# Patient Record
Sex: Female | Born: 1965 | Race: White | Hispanic: No | Marital: Married | Smoking: Never smoker
Health system: Southern US, Community
[De-identification: ages and names within clinical notes are randomized; demographics above are authoritative.]

## PROBLEM LIST (undated history)

## (undated) DIAGNOSIS — E079 Disorder of thyroid, unspecified: Secondary | ICD-10-CM

## (undated) DIAGNOSIS — E785 Hyperlipidemia, unspecified: Secondary | ICD-10-CM

## (undated) DIAGNOSIS — G43909 Migraine, unspecified, not intractable, without status migrainosus: Secondary | ICD-10-CM

## (undated) DIAGNOSIS — I82409 Acute embolism and thrombosis of unspecified deep veins of unspecified lower extremity: Secondary | ICD-10-CM

## (undated) DIAGNOSIS — Z803 Family history of malignant neoplasm of breast: Secondary | ICD-10-CM

## (undated) DIAGNOSIS — F32A Depression, unspecified: Secondary | ICD-10-CM

## (undated) DIAGNOSIS — D689 Coagulation defect, unspecified: Secondary | ICD-10-CM

## (undated) DIAGNOSIS — N879 Dysplasia of cervix uteri, unspecified: Secondary | ICD-10-CM

## (undated) DIAGNOSIS — A64 Unspecified sexually transmitted disease: Secondary | ICD-10-CM

## (undated) DIAGNOSIS — R32 Unspecified urinary incontinence: Secondary | ICD-10-CM

## (undated) DIAGNOSIS — F329 Major depressive disorder, single episode, unspecified: Secondary | ICD-10-CM

## (undated) HISTORY — DX: Unspecified sexually transmitted disease: A64

## (undated) HISTORY — DX: Major depressive disorder, single episode, unspecified: F32.9

## (undated) HISTORY — DX: Family history of malignant neoplasm of breast: Z80.3

## (undated) HISTORY — DX: Migraine, unspecified, not intractable, without status migrainosus: G43.909

## (undated) HISTORY — DX: Acute embolism and thrombosis of unspecified deep veins of unspecified lower extremity: I82.409

## (undated) HISTORY — DX: Coagulation defect, unspecified: D68.9

## (undated) HISTORY — DX: Unspecified urinary incontinence: R32

## (undated) HISTORY — DX: Disorder of thyroid, unspecified: E07.9

## (undated) HISTORY — DX: Depression, unspecified: F32.A

## (undated) HISTORY — DX: Hyperlipidemia, unspecified: E78.5

## (undated) HISTORY — DX: Dysplasia of cervix uteri, unspecified: N87.9

## (undated) HISTORY — PX: DILATION AND CURETTAGE OF UTERUS: SHX78

## (undated) HISTORY — PX: CERVICAL BIOPSY  W/ LOOP ELECTRODE EXCISION: SUR135

## (undated) HISTORY — PX: IUD REMOVAL: SHX5392

---

## 1999-09-14 HISTORY — PX: BREAST BIOPSY: SHX20

## 2007-10-23 ENCOUNTER — Ambulatory Visit (HOSPITAL_COMMUNITY): Admission: RE | Admit: 2007-10-23 | Discharge: 2007-10-23 | Payer: Self-pay | Admitting: Obstetrics

## 2007-12-18 DIAGNOSIS — A6000 Herpesviral infection of urogenital system, unspecified: Secondary | ICD-10-CM | POA: Insufficient documentation

## 2010-04-08 ENCOUNTER — Encounter: Payer: Self-pay | Admitting: Internal Medicine

## 2010-05-21 ENCOUNTER — Ambulatory Visit: Payer: Self-pay | Admitting: Internal Medicine

## 2010-05-21 DIAGNOSIS — R0602 Shortness of breath: Secondary | ICD-10-CM | POA: Insufficient documentation

## 2010-05-21 DIAGNOSIS — G43909 Migraine, unspecified, not intractable, without status migrainosus: Secondary | ICD-10-CM | POA: Insufficient documentation

## 2010-05-21 DIAGNOSIS — Z8541 Personal history of malignant neoplasm of cervix uteri: Secondary | ICD-10-CM | POA: Insufficient documentation

## 2010-05-21 DIAGNOSIS — J45909 Unspecified asthma, uncomplicated: Secondary | ICD-10-CM | POA: Insufficient documentation

## 2010-07-14 ENCOUNTER — Telehealth (INDEPENDENT_AMBULATORY_CARE_PROVIDER_SITE_OTHER): Payer: Self-pay | Admitting: *Deleted

## 2010-10-13 NOTE — Assessment & Plan Note (Signed)
Summary: Pulmnary consultation/ ? asthma > start dulera 100   Visit Type:  Initial Consult Copy to:  self Primary Agam Tuohy/Referring Mercy Leppla:  Dr. Herb Grays  CC:  Wheezing and Dyspnea.  History of Present Illness: 22 yowf never smoker no resp problems prior to dx of pna in January 2011 and not right since  May 21, 2010  1st pulmonary office eval for doe with ex slt uphill to bustop and winded sometimes with a cough,  occ wake up coughing and also wheezing better transiently with saba.  Pt denies any significant sore throat, dysphagia, itching, sneezing,  nasal congestion or excess secretions,  fever, chills, sweats, unintended wt loss, pleuritic or exertional cp, hempoptysis, variability  in activity tolerance  orthopnea pnd or leg swelling Pt also denies any obvious fluctuation in symptoms with weather or environmental change or other alleviating or aggravating factors.       Current Medications (verified): 1)  Fluoxetine Hcl 20 Mg Caps (Fluoxetine Hcl) .Marland Kitchen.. 1 Two Times A Day 2)  Synthroid 150 Mcg Tabs (Levothyroxine Sodium) .Marland Kitchen.. 1 Once Daily 3)  Topamax 50 Mg Tabs (Topiramate) .... 2 Once Daily 4)  Wellbutrin Xl 150 Mg Xr24h-Tab (Bupropion Hcl) .... 3 Once Daily 5)  Junel Fe 1/20 1-20 Mg-Mcg Tabs (Norethin Ace-Eth Estrad-Fe) .Marland Kitchen.. 1 Once Daily 6)  Proventil Hfa 108 (90 Base) Mcg/act Aers (Albuterol Sulfate) .... 2 Puffs Every 4-6 Hrs As Needed 7)  Fioricet 50-325-40 Mg Tabs (Butalbital-Apap-Caffeine) .Marland Kitchen.. 1 To 2 Every 6 Hrs As Needed  Allergies (verified): No Known Drug Allergies  Past History:  Past Medical History: Asthma, onset p CAP Jan 2011    - HF  75% May 21, 2010   Family History: Breast CA- Mother Heart dz- Father sister ? 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  The patient denies productive cough, coughing up blood, irregular heartbeats, acid heartburn, indigestion, loss of appetite, abdominal pain, difficulty swallowing, sore throat, tooth/dental problems, nasal congestion/difficulty breathing through nose, sneezing, anxiety, depression, hand/feet swelling, joint stiffness or pain, rash, change in color of mucus, and fever.    Vital Signs:  Patient profile:   45 year old female Height:      67 inches Weight:      182 pounds BMI:     28.61 O2 Sat:      97 % on Room air Temp:     98.7 degrees F oral Pulse rate:   73 / minute BP sitting:   116 / 70  (right arm)  Vitals Entered By: Vernie Murders (May 21, 2010 10:26 AM)  O2 Flow:  Room air  Physical Exam  Additional Exam:  amb pleasant wf nad wt 182 May 21, 2010 HEENT: nl dentition, turbinates, and orophanx. Nl external ear canals without cough reflex NECK :  without JVD/Nodes/TM/ nl carotid upstrokes bilaterally LUNGS: no acc muscle use,  a few basilar insp pops and squeaks  bilaterally without cough on insp or exp maneuvers CV:  RRR  no s3 or murmur or increase in P2, no edema  ABD:  soft and nontender with nl excursion in the supine position. No bruits or organomegaly, bowel sounds nl MS:  warm without deformities, calf tenderness, cyanosis or clubbing SKIN:  warm and dry without lesions   NEURO:  alert, approp, no deficits     CXR  Procedure date:  04/08/2010  Findings:      No acute cardiopulmonary findings  Impression & Recommendations:  Problem # 1:  ASTHMA, UNSPECIFIED (ICD-493.90) New onset p apparent cap ? mycoplasma ? no way to be sure - response to albuterol is convincing she needs a trial of combination rx with laba/ics to see first whether symptoms can be irradicated and return for baseline pft's for correlation    DDX of  difficult airways managment all start with A and  include Adherence,  Ace Inhibitors, Acid Reflux, Active Sinus Disease, Alpha 1 Antitripsin deficiency, Anxiety masquerading as Airways dz,  ABPA,  allergy(esp in young), Aspiration (esp in elderly), Adverse effects of DPI,  Active smokers, plus one B  = Beta blocker use..     Adherence is the biggest issue in most asthmatics  and starts with  inability to use HFA optimally  and also  understand that SABA treats the symptoms but doesn't get to the underlying problem (inflammation).  I used  the ananology of putting steroid cream on a rash to help explain the meaning of topical therapy and the need to get the drug to the target tissue.  I spent extra time with the patient today explaining optimal mdi  technique.  This improved from  50-75%  ? acid reflux: rx empirically until cough erradicated since cough from asthma  can cause reflux and destabilize the airway  ? Allergy/ sinus dz:  address next if not improving on rx  Medications Added to Medication List This Visit: 1)  Fluoxetine Hcl 20 Mg Caps (Fluoxetine hcl) .Marland Kitchen.. 1 two times a day 2)  Synthroid 150 Mcg Tabs (Levothyroxine sodium) .Marland Kitchen.. 1 once daily 3)  Topamax 50 Mg Tabs (Topiramate) .... 2 once daily 4)  Wellbutrin Xl 150 Mg Xr24h-tab (Bupropion hcl) .... 3 once daily 5)  Junel Fe 1/20 1-20 Mg-mcg Tabs (Norethin ace-eth estrad-fe) .Marland Kitchen.. 1 once daily 6)  Proventil Hfa 108 (90 Base) Mcg/act Aers (Albuterol sulfate) .... 2 puffs every 4-6 hrs as needed 7)  Fioricet 50-325-40 Mg Tabs (Butalbital-apap-caffeine) .Marland Kitchen.. 1 to 2 every 6 hrs as needed 8)  Dulera 100-5 Mcg/act Aero (Mometasone furo-formoterol fum) .... 2 puffs first thing  in am and 2 puffs again in pm about 12 hours later 9)  Protonix 40 Mg Solr (Pantoprazole sodium) .... Take  one 30-60 min before first meal of the day 10)  Pepcid Ac Maximum Strength 20 Mg Tabs (Famotidine) .... One at bedtime  Other Orders: Consultation Level V [phone])  Patient Instructions: 1)  Dulera 100 2 puffs first thing  in am  and 2 puffs again in pm about 12 hours later you should much less need for the proventil 2)  Work on inhaler technique:  relax and blow all the way out then take a nice smooth deep breath back in, triggering the inhaler at same time you start breathing in and hold for a few sec and then rinse mouth 3)  Pantoprozole 40 Take  one 30-60 min before first meal of the day and pepcid 20 mg at bedtime until cough is 100% gone 4)  GERD (REFLUX)  is a common cause of respiratory symptoms. It commonly presents without heartburn and can be treated with medication, but also with lifestyle changes including avoidance of late meals, excessive alcohol, smoking cessation, and avoid fatty foods, chocolate, peppermint, colas, red wine, and  acidic juices such as orange juice. NO MINT OR MENTHOL PRODUCTS SO NO COUGH DROPS  5)  USE SUGARLESS CANDY INSTEAD (jolley ranchers)  6)  NO OIL BASED VITAMINS  7)  Please schedule a follow-up appointment in 4  weeks, sooner if needed  Prescriptions: PROTONIX 40 MG SOLR (PANTOPRAZOLE SODIUM) Take  one 30-60 min before first meal of the day  #34 x 2   Entered and Authorized by:   Nyoka Cowden MD   Signed by:   Nyoka Cowden MD on 05/21/2010   Method used:   [facility]  [place] [address]       [address]       [place], [place]  [address]       Ph: [phone] or [phone]       Fax: [phone]   RxID:   [id number]

## 2010-10-13 NOTE — Progress Notes (Signed)
Summary: Not satisfied with dulera 100, try symbicot 160-LMTCBx1  Phone Note Call from Patient   Caller: Patient Call For: WERT Summary of Call: pt says the dulera worked well. but now that she has finished using this she is wheezing. [facility]s advise[facility] [place] pt cell [phone] Initial call taken by: Tivis Ringer, CNA,  July 14, 2010 10:26 AM  Follow-up for Phone Call        Called, spoke with pt.  She states dulera "seems to work sometimes but not all the time."  States chest is not as heavy, SOB has improved, but still feels " the wheeze every now and then."  WOuld like to know if there is something she can try in place of the dulera.  Will forward message to MW to address. Follow-up by: Gweneth Dimitri RN,  July 14, 2010 10:57 AM  Additional Follow-up for Phone Call Additional follow up Details #1::        planned to see her back before she ran out of dulera so try sample of symbicort 160 x one box only rx 2 puffs first thing  in am and 2 puffs again in pm about 12 hours later and bring this and all active meds to ov w/in 2 weeks Additional Follow-up by: Nyoka Cowden MD,  July 14, 2010 1:19 PM    Additional Follow-up for Phone Call Additional follow up Details #2::    LMTCBx1.Carron Curie CMA  July 14, 2010 2:39 PM  Spoke with pt and notified of recs per MW.  Pt verbalized understanding.  Appt sched for 07/30/10 at 9 am and pt aware to bring all meds with her to appt.  Sample symbicort left up front.  Follow-up by: Vernie Murders,  July 16, 2010 10:05 AM

## 2012-12-14 ENCOUNTER — Ambulatory Visit (INDEPENDENT_AMBULATORY_CARE_PROVIDER_SITE_OTHER): Payer: 59 | Admitting: Obstetrics and Gynecology

## 2012-12-14 ENCOUNTER — Encounter: Payer: Self-pay | Admitting: Obstetrics and Gynecology

## 2012-12-14 VITALS — BP 102/60 | Ht 66.5 in | Wt 178.0 lb

## 2012-12-14 DIAGNOSIS — N952 Postmenopausal atrophic vaginitis: Secondary | ICD-10-CM

## 2012-12-14 DIAGNOSIS — Z01419 Encounter for gynecological examination (general) (routine) without abnormal findings: Secondary | ICD-10-CM

## 2012-12-14 DIAGNOSIS — B009 Herpesviral infection, unspecified: Secondary | ICD-10-CM

## 2012-12-14 DIAGNOSIS — Z1231 Encounter for screening mammogram for malignant neoplasm of breast: Secondary | ICD-10-CM

## 2012-12-14 DIAGNOSIS — Z0000 Encounter for general adult medical examination without abnormal findings: Secondary | ICD-10-CM

## 2012-12-14 LAB — POCT URINALYSIS DIPSTICK
Bilirubin, UA: NEGATIVE
Glucose, UA: NEGATIVE
Ketones, UA: NEGATIVE
Leukocytes, UA: NEGATIVE
Nitrite, UA: NEGATIVE
pH, UA: 5

## 2012-12-14 MED ORDER — VALACYCLOVIR HCL 500 MG PO TABS: 500.0000 mg | ORAL_TABLET | Freq: Every day | ORAL | Status: DC

## 2012-12-14 MED ORDER — NORETHIN ACE-ETH ESTRAD-FE 1-20 MG-MCG PO TABS: 1.0000 | ORAL_TABLET | Freq: Every day | ORAL | Status: DC

## 2012-12-14 MED ORDER — ESTRADIOL 10 MCG VA TABS: 1.0000 | ORAL_TABLET | VAGINAL | Status: DC

## 2012-12-14 NOTE — Progress Notes (Signed)
Patient ID: Gwendolyn Shields, female   DOB: Jun 20, 1966, 47 y.o.   MRN: [id number]  47 y.o. MarriedCaucasian female   [phone] here for annual exam.   Some dyspareunia.  Vulvar/vaginal skin more sensitive.  Denies vaginal discharge.  Wants to try vaginal estrogen tablets. Patient is on OCPS continuous, so no menses.  Considering tubal ligation.    Takes Topamax for headaches.  No menstrual headaches.    Reports cracking of lip area.  Wants to try suppression with antiviral.  History of HSV.  Will do labs with PCP when has thyroid check done.  Patient's last menstrual period was 07/14/2012.          Sexually active: yes  The current method of family planning is OCP (estrogen/progesterone).    Exercising:  No.  LIkes to walk.   Last mammogram:  February 2013 Last pap smear:  March 2013 - normal History of abnormal pap:   History of conization.  Patient thinks she had a diagnosis of cervical cancer but will check on this. Smoking:  No. Alcohol:  2 -4 per month. Last colonoscopy: never Last Bone Density:  never Last tetanus shot: 11 years ago.  Patient will do with PCP. Last cholesterol check:  uncertain  Hgb:   12.9             Urine:  negative    Health Maintenance  Topic Date Due  . Influenza Vaccine  05/14/1966  . Pap Smear  07/07/1983  . Tetanus/tdap  07/06/1984    Family History  Problem Relation Age of Onset  . Cancer Mother 4    bone ca  . Breast cancer Mother   . Heart failure Father 81  . Hypertension Father   . Stroke Maternal Grandmother   . Diabetes Maternal Grandfather   . Diabetes Paternal Grandmother     Patient Active Problem List  Diagnosis  . CERVICAL CANCER  . ASTHMA, UNSPECIFIED  . DYSPNEA  . HEADACHE, CHRONIC, HX OF    Past Medical History  Diagnosis Date  . Depression   . STD (sexually transmitted disease)     HSV 2    Past Surgical History  Procedure Laterality Date  . Cervical biopsy  w/ loop electrode excision      14yrs ago  .  Breast biopsy Left 2001    benign  . Dilation and curettage of uterus      age 47    Allergies: Review of patient's allergies indicates no known allergies.  Current Outpatient Prescriptions  Medication Sig Dispense Refill  . ARMOUR THYROID 120 MG tablet       . butalbital-aspirin-caffeine (FIORINAL) 50-325-40 MG per capsule       . norethindrone-ethinyl estradiol (JUNEL FE,GILDESS FE,LOESTRIN FE) 1-20 MG-MCG tablet Take 1 tablet by mouth daily.      Marland Kitchen topiramate (TOPAMAX) 100 MG tablet       . WELLBUTRIN XL 150 MG 24 hr tablet        No current facility-administered medications for this visit.    ROS: Pertinent items are noted in HPI.  Exam:    BP 102/60  Ht 5' 6.5" (1.689 m)  Wt 178 lb (80.74 kg)  BMI 28.3 kg/m2  LMP 07/14/2012   Wt Readings from Last 3 Encounters:  12/14/12 178 lb (80.74 kg)  05/21/10 182 lb (82.555 kg)     Ht Readings from Last 3 Encounters:  12/14/12 5' 6.5" (1.689 m)  05/21/10 5\' 7"  (1.702 m)  General appearance: alert, cooperative and appears stated age Head: Normocephalic, without obvious abnormality, atraumatic Neck: no adenopathy, supple, symmetrical, trachea midline and thyroid not enlarged, symmetric, no tenderness/mass/nodules Lungs: clear to auscultation bilaterally Breasts: Inspection negative, No nipple retraction or dimpling, No nipple discharge or bleeding, No axillary or supraclavicular adenopathy, Normal to palpation without dominant masses Heart: regular rate and rhythm Abdomen: soft, non-tender; bowel sounds normal; no masses,  no organomegaly Extremities: extremities normal, atraumatic, no cyanosis or edema Skin: Skin color, texture, turgor normal. No rashes or lesions Lymph nodes: Cervical, supraclavicular, and axillary nodes normal. No abnormal inguinal nodes palpated Neurologic: Grossly normal   Pelvic: External genitalia:  no lesions              Urethra:  normal appearing urethra with no masses, tenderness or lesions               Bartholins and Skenes: normal                 Vagina: normal appearing vagina with normal color and discharge, no lesions              Cervix: normal appearance              Pap taken: yes and High Risk HVP testing done.        Bimanual Exam:  Uterus:  uterus is normal size, shape, consistency and nontender                                      Adnexa: normal adnexa in size, nontender and no masses                                      Rectovaginal: Confirms                                      Anus:  normal sphincter tone, no lesions  A: normal gyn exam History of cervical dysplasia, doubt cervical cancer based on treatment of LEEP. OCP patient.  After discussion, declines tubal ligation and Mirena. Atrophic vaginitis. Hx HSV.  Possible oral outbreaks.     P: mammogram pap smear and HPV testing. Continue OCP.s. Start vagifem. Start Valtrex suppressive dosing. Get pathology report from office where had LEEP procedure. return annually or prn     An After Visit Summary was printed and given to the patient.

## 2012-12-14 NOTE — Patient Instructions (Signed)

## 2012-12-18 LAB — HEMOGLOBIN, FINGERSTICK: Hemoglobin, fingerstick: 12.9 g/dL (ref 12.0–16.0)

## 2013-01-23 ENCOUNTER — Ambulatory Visit
Admission: RE | Admit: 2013-01-23 | Discharge: 2013-01-23 | Disposition: A | Discharge status: Home / Self Care | Payer: 59 | Source: Ambulatory Visit | Attending: Obstetrics and Gynecology | Admitting: Obstetrics and Gynecology

## 2013-01-23 DIAGNOSIS — Z1231 Encounter for screening mammogram for malignant neoplasm of breast: Secondary | ICD-10-CM

## 2013-07-19 ENCOUNTER — Telehealth: Payer: Self-pay

## 2013-07-19 NOTE — Telephone Encounter (Addendum)
New patient Left message for call back Identifiable  Medication and allergies: reviewed and updated  90 day supply/mail order: na Local pharmacy: [facility] in [place]   Immunizations due:  tdap   A/P:   FH and PSH reviewed  Flu--07/11/2013 Pap--12/2012--normal MMG--01/2013--normal  To Discuss with Provider: TSH check Back discomfort Irritated area Left inner thigh

## 2013-07-20 ENCOUNTER — Ambulatory Visit (HOSPITAL_BASED_OUTPATIENT_CLINIC_OR_DEPARTMENT_OTHER)
Admission: RE | Admit: 2013-07-20 | Discharge: 2013-07-20 | Disposition: A | Discharge status: Home / Self Care | Payer: 59 | Source: Ambulatory Visit | Attending: Family Medicine | Admitting: Family Medicine

## 2013-07-20 ENCOUNTER — Encounter: Payer: Self-pay | Admitting: General Practice

## 2013-07-20 ENCOUNTER — Encounter: Payer: Self-pay | Admitting: Family Medicine

## 2013-07-20 ENCOUNTER — Ambulatory Visit (INDEPENDENT_AMBULATORY_CARE_PROVIDER_SITE_OTHER): Payer: 59 | Admitting: Family Medicine

## 2013-07-20 ENCOUNTER — Telehealth: Payer: Self-pay | Admitting: Family Medicine

## 2013-07-20 VITALS — BP 104/68 | HR 88 | Temp 98.0°F | Resp 16 | Ht 66.5 in | Wt 185.5 lb

## 2013-07-20 DIAGNOSIS — R079 Chest pain, unspecified: Secondary | ICD-10-CM | POA: Insufficient documentation

## 2013-07-20 DIAGNOSIS — M898X1 Other specified disorders of bone, shoulder: Secondary | ICD-10-CM

## 2013-07-20 DIAGNOSIS — M25519 Pain in unspecified shoulder: Secondary | ICD-10-CM | POA: Insufficient documentation

## 2013-07-20 DIAGNOSIS — M899 Disorder of bone, unspecified: Secondary | ICD-10-CM

## 2013-07-20 DIAGNOSIS — B369 Superficial mycosis, unspecified: Secondary | ICD-10-CM | POA: Insufficient documentation

## 2013-07-20 DIAGNOSIS — Z0000 Encounter for general adult medical examination without abnormal findings: Secondary | ICD-10-CM

## 2013-07-20 LAB — CBC WITH DIFFERENTIAL/PLATELET
Basophils Relative: 0.3 % (ref 0.0–3.0)
Eosinophils Absolute: 0.1 10*3/uL (ref 0.0–0.7)
Eosinophils Relative: 1.1 % (ref 0.0–5.0)
Hemoglobin: 13.5 g/dL (ref 12.0–15.0)
Lymphocytes Relative: 19.8 % (ref 12.0–46.0)
Monocytes Relative: 4.2 % (ref 3.0–12.0)
Neutro Abs: 6.3 10*3/uL (ref 1.4–7.7)
Neutrophils Relative %: 74.6 % (ref 43.0–77.0)
RBC: 4.38 Mil/uL (ref 3.87–5.11)
WBC: 8.5 10*3/uL (ref 4.5–10.5)

## 2013-07-20 LAB — TSH: TSH: 0.64 u[IU]/mL (ref 0.35–5.50)

## 2013-07-20 LAB — BASIC METABOLIC PANEL
BUN: 11 mg/dL (ref 6–23)
CO2: 24 mEq/L (ref 19–32)
Chloride: 108 mEq/L (ref 96–112)
Creatinine, Ser: 1 mg/dL (ref 0.4–1.2)
Potassium: 4 mEq/L (ref 3.5–5.1)

## 2013-07-20 LAB — LIPID PANEL
Cholesterol: 199 mg/dL (ref 0–200)
LDL Cholesterol: 110 mg/dL — ABNORMAL HIGH (ref 0–99)
VLDL: 21 mg/dL (ref 0.0–40.0)

## 2013-07-20 LAB — HEPATIC FUNCTION PANEL
ALT: 21 U/L (ref 0–35)
AST: 22 U/L (ref 0–37)
Total Bilirubin: 0.4 mg/dL (ref 0.3–1.2)
Total Protein: 7 g/dL (ref 6.0–8.3)

## 2013-07-20 MED ORDER — BUPROPION HCL ER (XL) 150 MG PO TB24: 150.0000 mg | ORAL_TABLET | Freq: Every day | ORAL | Status: DC

## 2013-07-20 MED ORDER — CITALOPRAM HYDROBROMIDE 10 MG PO TABS: 10.0000 mg | ORAL_TABLET | Freq: Every day | ORAL | Status: DC

## 2013-07-20 MED ORDER — BUTALBITAL-ASA-CAFFEINE 50-325-40 MG PO CAPS: 1.0000 | ORAL_CAPSULE | Freq: Three times a day (TID) | ORAL | Status: DC | PRN

## 2013-07-20 MED ORDER — CLOTRIMAZOLE-BETAMETHASONE 1-0.05 % EX CREA: 1.0000 "application " | TOPICAL_CREAM | Freq: Two times a day (BID) | CUTANEOUS | Status: DC

## 2013-07-20 MED ORDER — CYCLOBENZAPRINE HCL 10 MG PO TABS: 10.0000 mg | ORAL_TABLET | Freq: Three times a day (TID) | ORAL | Status: DC | PRN

## 2013-07-20 NOTE — Patient Instructions (Signed)
Follow up in 6 months to recheck thyroid, depression We'll notify you of your lab results and make any changes if needed [facility] to [facility] and get your chest xray at your convenience Start the Flexeril nightly for muscle spasm Heating pad for the shoulder blade Use the Lotrisone cream twice daily on the spot Call with any questions or concerns Welcome!  We're glad to have you!!

## 2013-07-20 NOTE — Assessment & Plan Note (Signed)
New.  Start topical antifungal/steroid combo cream.  Reviewed supportive care and red flags that should prompt return.  Pt expressed understanding and is in agreement w/ plan.

## 2013-07-20 NOTE — Telephone Encounter (Signed)
Patient is calling to have her prescriptions that were sent to [facility] today transferred over to her mail order pharmacy instead. [facility]. Patient says that her rx butalbital-aspirin-caffeine [facility]) was not filled but she only has 3 remaining and needs this sent as well. Please advise.

## 2013-07-20 NOTE — Assessment & Plan Note (Signed)
New.  Suspect this is muscular given increased pain in AM that eases as the day continues and improvement w/ massage.  But given that pain is radiating through to the anterior chest wall, will get xray to assess for widened mediastinum or other vascular changes and get EKG- normal.  Start flexeril, heat.  Continue massage.  Reviewed supportive care and red flags that should prompt return.  Pt expressed understanding and is in agreement w/ plan.

## 2013-07-20 NOTE — Assessment & Plan Note (Signed)
Pt's PE WNL.  UTD on GYN.  Check labs.  Anticipatory guidance provided.  

## 2013-07-20 NOTE — Telephone Encounter (Signed)
meds were sent to mail order.

## 2013-07-20 NOTE — Progress Notes (Signed)
  Subjective:    Patient ID: Gwendolyn Shields, female    DOB: 02-Jun-1966, 47 y.o.   MRN: [id number]  HPI New to establish.  Previous MD- Spears  GYNEdward Jolly, UTD on mammo and pap  CPE-  L shoulder blade pain- sxs started 'several months ago'.  Initially thought it was muscular but now feels that it is radiating through chest and causing discomfort and tightness.  Pain is worse in the AM, eases as the day goes on.  Improves w/ massage.  No improvement w/ motrin.  No change in pillows or mattress.  Skin lesion- L groin, noted a few months ago.  Will flare, ooze, improve.  Very itchy.   Review of Systems Patient reports no vision changes, adenopathy,fever, weight change,  persistant/recurrent hoarseness , swallowing issues, chest pain, palpitations, edema, persistant/recurrent cough, hemoptysis, dyspnea (rest/exertional/paroxysmal nocturnal), gastrointestinal bleeding (melena, rectal bleeding), abdominal pain, significant heartburn, bowel changes, GU symptoms (dysuria, hematuria, incontinence), Gyn symptoms (abnormal  bleeding, pain),  syncope, focal weakness, memory loss, numbness & tingling, hair/nail changes, abnormal bruising or bleeding, anxiety, or depression.  Decreased hearing- R ear.     Objective:   Physical Exam General Appearance:    Alert, cooperative, no distress, appears stated age  Head:    Normocephalic, without obvious abnormality, atraumatic  Eyes:    PERRL, conjunctiva/corneas clear, EOM's intact, fundi    benign, both eyes  Ears:    Normal TM's and external ear canals, both ears  Nose:   Nares normal, septum midline, mucosa normal, no drainage    or sinus tenderness  Throat:   Lips, mucosa, and tongue normal; teeth and gums normal  Neck:   Supple, symmetrical, trachea midline, no adenopathy;    Thyroid: no enlargement/tenderness/nodules  Back:     Symmetric, no curvature, ROM normal, + TTP along L medial scapular border  Lungs:     Clear to auscultation bilaterally,  respirations unlabored  Chest Wall:    No tenderness or deformity   Heart:    Regular rate and rhythm, S1 and S2 normal, no murmur, rub   or gallop  Breast Exam:    Deferred to GYN  Abdomen:     Soft, non-tender, bowel sounds active all four quadrants,    no masses, no organomegaly  Genitalia:    Deferred to GYN  Rectal:    Extremities:   Extremities normal, atraumatic, no cyanosis or edema  Pulses:   2+ and symmetric all extremities  Skin:   Skin color, texture, turgor normal, 4 cm ovoid area in L groin consistent w/ fungal dermatitis.  Lymph nodes:   Cervical, supraclavicular, and axillary nodes normal  Neurologic:   CNII-XII intact, normal strength, sensation and reflexes    throughout          Assessment & Plan:

## 2013-07-23 ENCOUNTER — Encounter: Payer: Self-pay | Admitting: General Practice

## 2013-07-23 LAB — VITAMIN D 1,25 DIHYDROXY
Vitamin D 1, 25 (OH)2 Total: 69 pg/mL (ref 18–72)
Vitamin D2 1, 25 (OH)2: <8 pg/mL
Vitamin D3 1, 25 (OH)2: 69 pg/mL

## 2013-09-04 ENCOUNTER — Other Ambulatory Visit: Payer: Self-pay | Admitting: *Deleted

## 2013-09-04 MED ORDER — THYROID 120 MG PO TABS: 120.0000 mg | ORAL_TABLET | Freq: Every day | ORAL | Status: DC

## 2013-10-10 ENCOUNTER — Telehealth: Payer: Self-pay | Admitting: Obstetrics and Gynecology

## 2013-10-10 DIAGNOSIS — Z01419 Encounter for gynecological examination (general) (routine) without abnormal findings: Secondary | ICD-10-CM

## 2013-10-10 MED ORDER — NORETHIN ACE-ETH ESTRAD-FE 1-20 MG-MCG PO TABS: 1.0000 | ORAL_TABLET | Freq: Every day | ORAL | Status: DC

## 2013-10-10 NOTE — Telephone Encounter (Signed)
yes

## 2013-10-10 NOTE — Telephone Encounter (Signed)
Patient returned call. Advised refill was sent to [facility]. Patient agreeable. Advised needs to schedule annual exam, patient does not have her calendar, she will call back.

## 2013-10-10 NOTE — Telephone Encounter (Signed)
Pt is trying to ger a refill on her birth control but pharmacy says she has not been seen by Dr Quincy Simmonds. She does not know the name of the medication.

## 2013-10-10 NOTE — Telephone Encounter (Signed)
[facility]. Spoke with [facility], she is out of refills. Patient takes continuous ocp per Dr. Elza Rafter note. Dr. Charlies Constable, can you co-sign order?

## 2013-10-10 NOTE — Telephone Encounter (Signed)
LMTCB.   (Last Aex with BS 12-14-12. On generic Loestrin FE 1-20. We could give her 3 refills to get her thru til April. Does she want local or mail order pharmacy? Mail order is listed. Needs to make Aex appt week of 12-17-13.)

## 2013-10-25 ENCOUNTER — Telehealth: Payer: Self-pay | Admitting: Obstetrics and Gynecology

## 2013-10-25 NOTE — Telephone Encounter (Signed)
Patient is having "break though bleeding" and is concerned as she takes birth control continuously and hasn't had this problem before.

## 2013-10-25 NOTE — Telephone Encounter (Signed)
Spoke with patient. She is on continuous Loestrin. Two weeks ago, went one week without her pills while she was out of town and began a cycle. Started a new pack of pills with cycle and is now on active pills of her new pack. She c/o bleeding with new week of active pills. States that bleeding is brownish/old appearing blood, describes as light in flow. Discussed with patient that since she has been on continuous birth control pills and quickly stopped taking pills, that it will take some time for bleeding to become regulated again. Patient states she is not sexually active. Denies abdominal pain. Offered office visit with Dr. Quincy Simmonds to discuss and patient declines office visit. She states she will monitor and call back if any further concerns. Annual exam scheduled at this time as well. Advised I would call her back if Dr. Quincy Simmonds had any further instructions. Patient agreeable to plan will f/u prn.  Routing to provider for final review. Patient agreeable to disposition. Will close encounter

## 2013-11-26 ENCOUNTER — Telehealth: Payer: Self-pay | Admitting: Obstetrics and Gynecology

## 2013-11-26 DIAGNOSIS — Z01419 Encounter for gynecological examination (general) (routine) without abnormal findings: Secondary | ICD-10-CM

## 2013-11-26 NOTE — Telephone Encounter (Signed)
Pt is requesting a refill for Junel Fe pt states she is short by one month for pills. Pt uses [facility].

## 2013-11-27 ENCOUNTER — Telehealth: Payer: Self-pay | Admitting: Obstetrics and Gynecology

## 2013-11-27 MED ORDER — NORETHIN ACE-ETH ESTRAD-FE 1-20 MG-MCG PO TABS: 1.0000 | ORAL_TABLET | Freq: Every day | ORAL | Status: DC

## 2013-11-27 NOTE — Telephone Encounter (Signed)
I signed the refill for one month.

## 2013-11-27 NOTE — Telephone Encounter (Signed)
Last AEX: 12/14/12 Last refilled: 10/10/13 #3 packs with 0 refills  AEX scheduled for 12/19/13 with Dr. Moody Bruins #1 pack with no refills sent to pharmacy to last patient until AEX  Patient is aware.  Routed to provider, encounter closed.

## 2013-11-27 NOTE — Telephone Encounter (Signed)
Thanks

## 2013-11-27 NOTE — Telephone Encounter (Signed)
Patient is calling for refill status. Patient is completely out of refills.

## 2013-11-28 ENCOUNTER — Encounter: Payer: Self-pay | Admitting: Family Medicine

## 2013-11-28 ENCOUNTER — Ambulatory Visit (HOSPITAL_BASED_OUTPATIENT_CLINIC_OR_DEPARTMENT_OTHER)
Admission: RE | Admit: 2013-11-28 | Discharge: 2013-11-28 | Disposition: A | Discharge status: Home / Self Care | Payer: 59 | Source: Ambulatory Visit | Attending: Family Medicine | Admitting: Family Medicine

## 2013-11-28 ENCOUNTER — Ambulatory Visit (INDEPENDENT_AMBULATORY_CARE_PROVIDER_SITE_OTHER): Payer: 59 | Admitting: Family Medicine

## 2013-11-28 VITALS — BP 106/70 | HR 67 | Temp 98.1°F | Resp 16 | Wt 186.4 lb

## 2013-11-28 DIAGNOSIS — M79609 Pain in unspecified limb: Secondary | ICD-10-CM

## 2013-11-28 DIAGNOSIS — M189 Osteoarthritis of first carpometacarpal joint, unspecified: Secondary | ICD-10-CM | POA: Insufficient documentation

## 2013-11-28 DIAGNOSIS — M79661 Pain in right lower leg: Secondary | ICD-10-CM

## 2013-11-28 DIAGNOSIS — M19049 Primary osteoarthritis, unspecified hand: Secondary | ICD-10-CM

## 2013-11-28 DIAGNOSIS — I82409 Acute embolism and thrombosis of unspecified deep veins of unspecified lower extremity: Secondary | ICD-10-CM

## 2013-11-28 MED ORDER — RIVAROXABAN (XARELTO) VTE STARTER PACK (15 & 20 MG): ORAL_TABLET | ORAL | Status: DC

## 2013-11-28 MED ORDER — MELOXICAM 15 MG PO TABS: 15.0000 mg | ORAL_TABLET | Freq: Every day | ORAL | Status: DC

## 2013-11-28 MED ORDER — RIVAROXABAN 20 MG PO TABS: 20.0000 mg | ORAL_TABLET | Freq: Every day | ORAL | Status: DC

## 2013-11-28 MED ORDER — NORETHINDRONE 0.35 MG PO TABS: 1.0000 | ORAL_TABLET | Freq: Every day | ORAL | Status: DC

## 2013-11-28 NOTE — Progress Notes (Signed)
Pre visit review using our clinic review tool, if applicable. No additional management support is needed unless otherwise documented below in the visit note. 

## 2013-11-28 NOTE — Patient Instructions (Signed)
Follow up by phone in 2 weeks if no improvement in hand pain Start the Mobic daily for pain and inflammation Ice the hands for pain relief We'll notify you of your ultrasound results Try and elevate the leg for pain relief Wear good, supportive foot wear Call with any questions or concerns Hang in there!!!

## 2013-11-28 NOTE — Progress Notes (Signed)
   Subjective:    Patient ID: Gwendolyn Shields, female    DOB: 1965-10-20, 48 y.o.   MRN: [id number]  HPI Leg pain- R ankle, radiating up into lower leg and into heel.  sxs started ~1 week ago.  No increased activity.  No change in footwear.  No known injury.  No hx of ankle problems.  No pain on L.  Mild swelling.  No bruising.  Pain described as 'a bruise w/o a bruise'.  On OCPs.  Pt and husband concerned for possible clot.  Thumb pain- bilateral, 'they ache all the time'.  1st CMC joint pain.  sxs started months ago.  No swelling.  No bruising.  Frequently massages daughters shoulders due to chronic HAs.     Review of Systems For ROS see HPI     Objective:   Physical Exam  Vitals reviewed. Constitutional: She is oriented to person, place, and time. She appears well-developed and well-nourished. No distress.  HENT:  Head: Normocephalic and atraumatic.  Cardiovascular: Intact distal pulses.   Musculoskeletal:  + TTP over 1st MCP joints bilaterally w/o obvious deformity or swelling + TTP over R medial ankle w/ trace swelling posterior to malleolus, pain extending into lower calf w/o swelling or redness  Neurological: She is alert and oriented to person, place, and time.  Skin: Skin is warm and dry. No erythema.          Assessment & Plan:

## 2013-11-29 ENCOUNTER — Telehealth: Payer: Self-pay | Admitting: Family Medicine

## 2013-11-29 DIAGNOSIS — I82409 Acute embolism and thrombosis of unspecified deep veins of unspecified lower extremity: Secondary | ICD-10-CM | POA: Insufficient documentation

## 2013-11-29 MED ORDER — DICLOFENAC SODIUM 1 % TD GEL: 2.0000 g | Freq: Four times a day (QID) | TRANSDERMAL | Status: DC

## 2013-11-29 NOTE — Assessment & Plan Note (Signed)
New.  Pt returned to office after [place] to get prescription and instructions on Xarelto.  Pt to stop OCPs.  Will start progestin only micronor until pt can see GYN.  Will not do hypercoag w/u at this time but if pt has 2nd DVT will refer to heme.  Goal at this time is 3 months of anticoagulation.  Pt expressed understanding and is in agreement w/ plan.

## 2013-11-29 NOTE — Telephone Encounter (Signed)
Lilia from [facility] called and wanted to make sure it was okay for patient to take meloxicam (MOBIC) 15 MG tablet and Rivaroxaban (XARELTO STARTER PACK) 15 & 20 MG TBPK . Tech states that both of the medications are considered to be blood thinners. Please advise

## 2013-11-29 NOTE — Assessment & Plan Note (Signed)
New.  Get [place] to assess for clot.  mobic prn.  Heat/ice.

## 2013-11-29 NOTE — Telephone Encounter (Signed)
D/c Mobic and send Voltaren gel for pain relief.  Meant to discuss this w/ pt yesterday when she returned but forgot when discussing birth control.

## 2013-11-29 NOTE — Assessment & Plan Note (Signed)
New.  Start daily NSAID for pain relief.  If no improvement, will need referral to hand specialist.  Will follow.

## 2013-11-29 NOTE — Addendum Note (Signed)
Addended by: Kris Hartmann on: 11/29/2013 04:15 PM   Modules accepted: Orders

## 2013-11-29 NOTE — Telephone Encounter (Signed)
Pharmacy notified.

## 2013-11-29 NOTE — Telephone Encounter (Signed)
Med filled.  

## 2013-12-19 ENCOUNTER — Ambulatory Visit (INDEPENDENT_AMBULATORY_CARE_PROVIDER_SITE_OTHER): Payer: 59 | Admitting: Obstetrics and Gynecology

## 2013-12-19 ENCOUNTER — Encounter: Payer: Self-pay | Admitting: Obstetrics and Gynecology

## 2013-12-19 VITALS — BP 120/82 | HR 70 | Ht 66.5 in | Wt 188.6 lb

## 2013-12-19 DIAGNOSIS — R1032 Left lower quadrant pain: Secondary | ICD-10-CM

## 2013-12-19 DIAGNOSIS — Z01419 Encounter for gynecological examination (general) (routine) without abnormal findings: Secondary | ICD-10-CM

## 2013-12-19 DIAGNOSIS — Z0000 Encounter for general adult medical examination without abnormal findings: Secondary | ICD-10-CM

## 2013-12-19 LAB — POCT URINALYSIS DIPSTICK
BILIRUBIN UA: NEGATIVE
Blood, UA: NEGATIVE
GLUCOSE UA: NEGATIVE
KETONES UA: NEGATIVE
LEUKOCYTES UA: NEGATIVE
Nitrite, UA: NEGATIVE
PH UA: 5
Protein, UA: NEGATIVE
Urobilinogen, UA: NEGATIVE

## 2013-12-19 NOTE — Patient Instructions (Signed)
EXERCISE AND DIET:  We recommended that you start or continue a regular exercise program for good health. Regular exercise means any activity that makes your heart beat faster and makes you sweat.  We recommend exercising at least 30 minutes per day at least 3 days a week, preferably 4 or 5.  We also recommend a diet low in fat and sugar.  Inactivity, poor dietary choices and obesity can cause diabetes, heart attack, stroke, and kidney damage, among others.    ALCOHOL AND SMOKING:  Women should limit their alcohol intake to no more than 7 drinks/beers/glasses of wine (combined, not each!) per week. Moderation of alcohol intake to this level decreases your risk of breast cancer and liver damage. And of course, no recreational drugs are part of a healthy lifestyle.  And absolutely no smoking or even second hand smoke. Most people know smoking can cause heart and lung diseases, but did you know it also contributes to weakening of your bones? Aging of your skin?  Yellowing of your teeth and nails?  CALCIUM AND VITAMIN D:  Adequate intake of calcium and Vitamin D are recommended.  The recommendations for exact amounts of these supplements seem to change often, but generally speaking 600 mg of calcium (either carbonate or citrate) and 800 units of Vitamin D per day seems prudent. Certain women may benefit from higher intake of Vitamin D.  If you are among these women, your doctor will have told you during your visit.    PAP SMEARS:  Pap smears, to check for cervical cancer or precancers,  have traditionally been done yearly, although recent scientific advances have shown that most women can have pap smears less often.  However, every woman still should have a physical exam from her gynecologist every year. It will include a breast check, inspection of the vulva and vagina to check for abnormal growths or skin changes, a visual exam of the cervix, and then an exam to evaluate the size and shape of the uterus and  ovaries.  And after 49 years of age, a rectal exam is indicated to check for rectal cancers. We will also provide age appropriate advice regarding health maintenance, like when you should have certain vaccines, screening for sexually transmitted diseases, bone density testing, colonoscopy, mammograms, etc.   MAMMOGRAMS:  All women over 40 years old should have a yearly mammogram. Many facilities now offer a "3D" mammogram, which may cost around $50 extra out of pocket. If possible,  we recommend you accept the option to have the 3D mammogram performed.  It both reduces the number of women who will be called back for extra views which then turn out to be normal, and it is better than the routine mammogram at detecting truly abnormal areas.    COLONOSCOPY:  Colonoscopy to screen for colon cancer is recommended for all women at age 50.  We know, you hate the idea of the prep.  We agree, BUT, having colon cancer and not knowing it is worse!!  Colon cancer so often starts as a polyp that can be seen and removed at colonscopy, which can quite literally save your life!  And if your first colonoscopy is normal and you have no family history of colon cancer, most women don't have to have it again for 10 years.  Once every ten years, you can do something that may end up saving your life, right?  We will be happy to help you get it scheduled when you are ready.    Be sure to check your insurance coverage so you understand how much it will cost.  It may be covered as a preventative service at no cost, but you should check your particular policy.     Levonorgestrel intrauterine device (IUD) What is this medicine? LEVONORGESTREL IUD (LEE voe nor jes trel) is a contraceptive (birth control) device. The device is placed inside the uterus by a healthcare professional. It is used to prevent pregnancy and can also be used to treat heavy bleeding that occurs during your period. Depending on the device, it can be used for 3 to 5  years. This medicine may be used for other purposes; ask your health care provider or pharmacist if you have questions. COMMON BRAND NAME(S): Mirena, Skyla What should I tell my health care provider before I take this medicine? They need to know if you have any of these conditions: -abnormal Pap smear -cancer of the breast, uterus, or cervix -diabetes -endometritis -genital or pelvic infection now or in the past -have more than one sexual partner or your partner has more than one partner -heart disease -history of an ectopic or tubal pregnancy -immune system problems -IUD in place -liver disease or tumor -problems with blood clots or take blood-thinners -use intravenous drugs -uterus of unusual shape -vaginal bleeding that has not been explained -an unusual or allergic reaction to levonorgestrel, other hormones, silicone, or polyethylene, medicines, foods, dyes, or preservatives -pregnant or trying to get pregnant -breast-feeding How should I use this medicine? This device is placed inside the uterus by a health care professional. Talk to your pediatrician regarding the use of this medicine in children. Special care may be needed. Overdosage: If you think you have taken too much of this medicine contact a poison control center or emergency room at once. NOTE: This medicine is only for you. Do not share this medicine with others. What if I miss a dose? This does not apply. What may interact with this medicine? Do not take this medicine with any of the following medications: -amprenavir -bosentan -fosamprenavir This medicine may also interact with the following medications: -aprepitant -barbiturate medicines for inducing sleep or treating seizures -bexarotene -griseofulvin -medicines to treat seizures like carbamazepine, ethotoin, felbamate, oxcarbazepine, phenytoin, topiramate -modafinil -pioglitazone -rifabutin -rifampin -rifapentine -some medicines to treat HIV  infection like atazanavir, indinavir, lopinavir, nelfinavir, tipranavir, ritonavir -St. John's wort -warfarin This list may not describe all possible interactions. Give your health care provider a list of all the medicines, herbs, non-prescription drugs, or dietary supplements you use. Also tell them if you smoke, drink alcohol, or use illegal drugs. Some items may interact with your medicine. What should I watch for while using this medicine? Visit your doctor or health care professional for regular check ups. See your doctor if you or your partner has sexual contact with others, becomes HIV positive, or gets a sexual transmitted disease. This product does not protect you against HIV infection (AIDS) or other sexually transmitted diseases. You can check the placement of the IUD yourself by reaching up to the top of your vagina with clean fingers to feel the threads. Do not pull on the threads. It is a good habit to check placement after each menstrual period. Call your doctor right away if you feel more of the IUD than just the threads or if you cannot feel the threads at all. The IUD may come out by itself. You may become pregnant if the device comes out. If you notice that the IUD has   come out use a backup birth control method like condoms and call your health care provider. Using tampons will not change the position of the IUD and are okay to use during your period. What side effects may I notice from receiving this medicine? Side effects that you should report to your doctor or health care professional as soon as possible: -allergic reactions like skin rash, itching or hives, swelling of the face, lips, or tongue -fever, flu-like symptoms -genital sores -high blood pressure -no menstrual period for 6 weeks during use -pain, swelling, warmth in the leg -pelvic pain or tenderness -severe or sudden headache -signs of pregnancy -stomach cramping -sudden shortness of breath -trouble with  balance, talking, or walking -unusual vaginal bleeding, discharge -yellowing of the eyes or skin Side effects that usually do not require medical attention (report to your doctor or health care professional if they continue or are bothersome): -acne -breast pain -change in sex drive or performance -changes in weight -cramping, dizziness, or faintness while the device is being inserted -headache -irregular menstrual bleeding within first 3 to 6 months of use -nausea This list may not describe all possible side effects. Call your doctor for medical advice about side effects. You may report side effects to FDA at 1-800-FDA-1088. Where should I keep my medicine? This does not apply. NOTE: This sheet is a summary. It may not cover all possible information. If you have questions about this medicine, talk to your doctor, pharmacist, or health care provider.  2014, Elsevier/Gold Standard. (2011-09-30 13:54:04)

## 2013-12-19 NOTE — Progress Notes (Signed)
Patient ID: Gwendolyn Shields, female   DOB: May 11, 1966, 48 y.o.   MRN: [id number] GYNECOLOGY VISIT  PCP:   Dr. Birdie Riddle  Referring provider:   HPI: 48 y.o.   Married  Caucasian  female   [phone] with Patient's last menstrual period was 11/25/2013.   here for   AEX.  Patient with LLQ pain since July 2014.  Not too significant, but is coming and going. Lasts for 3 - 4 days at a time.  Not migrating.  Can feel gasy at the time. No dysuria. History of ovarian cysts.  Notes breast tenderness right now.   Diagnosed with a thrombus of the posterior tibial vein and a branch of the greater saphenous vein in the mid calf on 11/28/13. On xeralto for 3 months.  Has a recheck in May 2015 with PCP. Patient was on Junel Fe at the time.  Has now switched to Micronor.  No family history of blood clots. Mother has circulation problems in her legs but no know DVT.   Has a history of headaches, but not menstrual migraines.   History of HSV.   Patient is very sensitive to office procedures and becomes nervous.   Hgb:    PCP, labs with PCP.  Urine:  Neg  GYNECOLOGIC HISTORY: Patient's last menstrual period was 11/25/2013. Sexually active:  no Partner preference: female Contraception:  Abstinence/OCP's  Menopausal hormone therapy: n/a DES exposure:  no  Blood transfusions:  no  Sexually transmitted diseases:  HSV GYN procedures and prior surgeries:  D  & C, LEEP Procedure 20 years ago. Last mammogram: 5-08-15-13 wnl:TBC                Last pap and high risk HPV testing:  12-18-12 wnl:neg HR HPV  History of abnormal pap smear:  Hx of abnormal pap 20 years ago with LEEP procedure.  Paps normal since LEEP procedure.   OB History   Grav Para Term Preterm Abortions TAB SAB Ect Mult Living   4 2 2  2  2   2        LIFESTYLE: Exercise:   no            Tobacco:   no Alcohol:       1-2 glasses of wine per month Drug use:     no  OTHER HEALTH MAINTENANCE: Tetanus/TDap:    2013 Gardisil:                n/a Influenza:             06/2013 Zostavax:             n/a  Bone density:       n/a Colonoscopy:       n/a  Cholesterol check:   Normal 07/2013 with PCP  Family History  Problem Relation Age of Onset  . Cancer Mother 2    bone ca  . Breast cancer Mother   . Heart failure Father 40  . Hypertension Father   . Stroke Maternal Grandmother   . Diabetes Maternal Grandfather   . Diabetes Paternal Grandmother     Patient Active Problem List   Diagnosis Date Noted  . DVT (deep venous thrombosis) 11/29/2013  . 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 Pain of right lower leg 11/28/2013  . Routine general medical examination at a health care facility 07/20/2013  . Fungal dermatitis 07/20/2013  . Shoulder blade pain 07/20/2013  . CERVICAL CANCER 05/21/2010  .  ASTHMA, UNSPECIFIED 05/21/2010  . DYSPNEA 05/21/2010  . HEADACHE, CHRONIC, HX OF 05/21/2010   Past Medical History  Diagnosis Date  . Depression   . STD (sexually transmitted disease)     HSV 2  . Migraine   . DVT (deep venous thrombosis)     Past Surgical History  Procedure Laterality Date  . Cervical biopsy  w/ loop electrode excision      21yrs ago  . Breast biopsy Left 2001    benign  . Dilation and curettage of uterus      age 48    ALLERGIES: Review of patient's allergies indicates no known allergies.  Current Outpatient Prescriptions  Medication Sig Dispense Refill  . buPROPion (WELLBUTRIN XL) 150 MG 24 hr tablet Take 1 tablet (150 mg total) by mouth daily.  90 tablet  1  . butalbital-aspirin-caffeine (FIORINAL) 50-325-40 MG per capsule Take 1 capsule by mouth 3 (three) times daily as needed for headache.  45 capsule  1  . citalopram (CELEXA) 10 MG tablet Take 1 tablet (10 mg total) by mouth daily.  90 tablet  1  . clotrimazole-betamethasone (LOTRISONE) cream Apply 1 application topically 2 (two) times daily.  30 g  0  . cyclobenzaprine (FLEXERIL) 10 MG tablet Take 1 tablet (10 mg total) by  mouth 3 (three) times daily as needed for muscle spasms.  30 tablet  3  . diclofenac sodium (VOLTAREN) 1 % GEL Apply 2 g topically 4 (four) times daily.  100 g  1  . norethindrone (MICRONOR,CAMILA,ERRIN) 0.35 MG tablet Take 1 tablet (0.35 mg total) by mouth daily.  1 Package  11  . Rivaroxaban (XARELTO STARTER PACK) 15 & 20 MG TBPK Take as directed on package: Start with one 15mg  tablet by mouth twice a day with food. On Day 22, switch to one 20mg  tablet once a day with food.  51 each  0  . Rivaroxaban (XARELTO) 20 MG TABS tablet Take 1 tablet (20 mg total) by mouth daily with supper.  30 tablet  1  . thyroid (ARMOUR THYROID) 120 MG tablet Take 1 tablet (120 mg total) by mouth daily before breakfast.  30 tablet  4  . topiramate (TOPAMAX) 100 MG tablet Take 100 mg by mouth daily. 2 tabs daily=200mg       . valACYclovir (VALTREX) 500 MG tablet Take 1 tablet (500 mg total) by mouth daily. 1 tablet po QD.  30 tablet  12   No current facility-administered medications for this visit.     ROS:  Pertinent items are noted in HPI.  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   PHYSICAL EXAMINATION:    BP 120/82  Pulse 70  Ht 5' 6.5" (1.689 m)  Wt 188 lb 9.6 oz (85.548 kg)  BMI 29.99 kg/m2  LMP 11/25/2013   Wt Readings from Last 3 Encounters:  12/19/13 188 lb 9.6 oz (85.548 kg)  11/28/13 186 lb 6 oz (84.539 kg)  07/20/13 185 lb 8 oz (84.142 kg)     Ht Readings from Last 3 Encounters:  12/19/13 5' 6.5" (1.689 m)  07/20/13 5' 6.5" (1.689 m)  12/14/12 5' 6.5" (1.689 m)    General appearance: alert, cooperative and appears stated age Head: Normocephalic, without obvious abnormality, atraumatic Neck: no adenopathy, supple, symmetrical, trachea midline and thyroid not enlarged, symmetric, no tenderness/mass/nodules Lungs: clear to auscultation bilaterally Breasts: Inspection negative, No nipple retraction or dimpling, No nipple discharge or bleeding, No axillary or supraclavicular adenopathy, Normal to  palpation  without dominant masses Heart: regular rate and rhythm Abdomen: soft, non-tender; no masses,  no organomegaly Skin: Skin color, texture, turgor normal. No rashes or lesions Lymph nodes: Cervical, supraclavicular, and axillary nodes normal. No abnormal inguinal nodes palpated Neurologic: Grossly normal  Pelvic: External genitalia:  no lesions              Urethra:  normal appearing urethra with no masses, tenderness or lesions              Bartholins and Skenes: normal                 Vagina: normal appearing vagina with normal color and discharge, no lesions              Cervix: normal appearance              Pap and high risk HPV testing done: no.            Bimanual Exam:  Uterus:  uterus is normal size, shape, consistency and nontender                                      Adnexa: normal adnexa in size, nontender and no masses                                      Rectovaginal: Confirms                                      Anus:  normal sphincter tone, no lesions.  Prolapsing hemorrhoid vs. polyp (Has had consultation and patient declines removal.)  ASSESSMENT  Normal gynecologic exam. Recent venous thrombosis of the right leg while on combined OCPs.  On Xarelto.  History of LEEP. LLQ pain.  History of HSV.   PLAN  Mammogram recommended yearly.  Patient will call to schedule at [facility].  Discussed 3D.  Pap smear and high risk HPV testing Counseled on self breast exam, Calcium and vitamin D intake, exercise. Ok to continue with Ortho Micronor.  Has refills for one year.  I also discussed Mirena IUD, which the patient will consider.  Return for pelvic ultrasound.  Return annually or prn   An After Visit Summary was printed and given to the patient.

## 2013-12-21 ENCOUNTER — Telehealth: Payer: Self-pay | Admitting: Obstetrics and Gynecology

## 2013-12-21 NOTE — Telephone Encounter (Signed)
Left message for patient to call back. Need to go over benefits and schedule PUS °

## 2013-12-23 ENCOUNTER — Other Ambulatory Visit: Payer: Self-pay | Admitting: Gynecology

## 2013-12-24 NOTE — Telephone Encounter (Signed)
Spoke with patient. Advised of $52.80 patient liability for PUS. Scheduled PUS. Mailed the In-Office procedure form that includes appointment date and time, patient copay, and cancellation policy.

## 2013-12-25 ENCOUNTER — Other Ambulatory Visit: Payer: Self-pay

## 2013-12-25 DIAGNOSIS — Z1231 Encounter for screening mammogram for malignant neoplasm of breast: Secondary | ICD-10-CM

## 2014-01-10 ENCOUNTER — Other Ambulatory Visit: Payer: 59

## 2014-01-10 ENCOUNTER — Other Ambulatory Visit: Payer: 59 | Admitting: Obstetrics and Gynecology

## 2014-01-10 ENCOUNTER — Ambulatory Visit (INDEPENDENT_AMBULATORY_CARE_PROVIDER_SITE_OTHER): Payer: 59 | Admitting: Obstetrics and Gynecology

## 2014-01-10 ENCOUNTER — Encounter: Payer: Self-pay | Admitting: Obstetrics and Gynecology

## 2014-01-10 ENCOUNTER — Ambulatory Visit (INDEPENDENT_AMBULATORY_CARE_PROVIDER_SITE_OTHER): Payer: 59

## 2014-01-10 VITALS — BP 120/70 | HR 76 | Ht 66.5 in | Wt 189.0 lb

## 2014-01-10 DIAGNOSIS — IMO0001 Reserved for inherently not codable concepts without codable children: Secondary | ICD-10-CM

## 2014-01-10 DIAGNOSIS — N83209 Unspecified ovarian cyst, unspecified side: Secondary | ICD-10-CM

## 2014-01-10 DIAGNOSIS — R1032 Left lower quadrant pain: Secondary | ICD-10-CM

## 2014-01-10 DIAGNOSIS — N83202 Unspecified ovarian cyst, left side: Secondary | ICD-10-CM

## 2014-01-10 DIAGNOSIS — Z309 Encounter for contraceptive management, unspecified: Secondary | ICD-10-CM

## 2014-01-10 NOTE — Progress Notes (Signed)
Ultrasound - report and images reviewed by me and patient together.   Uterus normal, retroverted with EMS 2.91 mm. Left ovary with 26 x 22 mm avascular cyst with a small echogenic avascular area most consistent with clot. Normal right ovary.  No free fluid.

## 2014-01-10 NOTE — Progress Notes (Signed)
Patient ID: Gwendolyn Shields, female   DOB: 1965-11-02, 48 y.o.   MRN: [id number] GYNECOLOGY  VISIT   HPI: 48 y.o.   Married  Caucasian  female   [phone] with Patient's last menstrual period was 11/25/2013.   here for pelvic ultrasound.     Desires Mirena IUD.   Has difficulty with procedures.  Wants relaxing medication.   Breakthrough bleeding on OrthoMicronor. Patient is not very sexually active.   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  Sister with recent diagnosis of endometrial cancer.   GYNECOLOGIC HISTORY: Patient's last menstrual period was 11/25/2013. Contraception:  OCP's.  Menopausal hormone therapy:  n/a        OB History   Grav Para Term Preterm Abortions TAB SAB Ect Mult Living   4 2 2  2  2   2          Patient Active Problem List   Diagnosis Date Noted  . DVT (deep venous thrombosis) 11/29/2013  . 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 Pain of right lower leg 11/28/2013  . Routine general medical examination at a health care facility 07/20/2013  . Fungal dermatitis 07/20/2013  . Shoulder blade pain 07/20/2013  . CERVICAL CANCER 05/21/2010  . ASTHMA, UNSPECIFIED 05/21/2010  . DYSPNEA 05/21/2010  . HEADACHE, CHRONIC, HX OF 05/21/2010    Past Medical History  Diagnosis Date  . Depression   . STD (sexually transmitted disease)     HSV 2  . Migraine   . DVT (deep venous thrombosis)     Past Surgical History  Procedure Laterality Date  . Cervical biopsy  w/ loop electrode excision      44yrs ago  . Breast biopsy Left 2001    benign  . Dilation and curettage of uterus      age 69    Current Outpatient Prescriptions  Medication Sig Dispense Refill  . buPROPion (WELLBUTRIN XL) 150 MG 24 hr tablet Take 1 tablet (150 mg total) by mouth daily.  90 tablet  1  . butalbital-aspirin-caffeine (FIORINAL) 50-325-40 MG per capsule Take 1 capsule by mouth 3 (three) times daily as needed for headache.  45 capsule  1  . citalopram (CELEXA) 10 MG tablet  Take 1 tablet (10 mg total) by mouth daily.  90 tablet  1  . clotrimazole-betamethasone (LOTRISONE) cream Apply 1 application topically 2 (two) times daily.  30 g  0  . cyclobenzaprine (FLEXERIL) 10 MG tablet Take 1 tablet (10 mg total) by mouth 3 (three) times daily as needed for muscle spasms.  30 tablet  3  . diclofenac sodium (VOLTAREN) 1 % GEL Apply 2 g topically 4 (four) times daily.  100 g  1  . norethindrone (MICRONOR,CAMILA,ERRIN) 0.35 MG tablet Take 1 tablet (0.35 mg total) by mouth daily.  1 Package  11  . Rivaroxaban (XARELTO STARTER PACK) 15 & 20 MG TBPK Take as directed on package: Start with one 15mg  tablet by mouth twice a day with food. On Day 22, switch to one 20mg  tablet once a day with food.  51 each  0  . Rivaroxaban (XARELTO) 20 MG TABS tablet Take 1 tablet (20 mg total) by mouth daily with supper.  30 tablet  1  . thyroid (ARMOUR THYROID) 120 MG tablet Take 1 tablet (120 mg total) by mouth daily before breakfast.  30 tablet  4  . topiramate (TOPAMAX) 100 MG tablet Take 100 mg by mouth daily. 2 tabs daily=200mg       .  valACYclovir (VALTREX) 500 MG tablet Take 1 tablet (500 mg total) by mouth daily. 1 tablet po QD.  30 tablet  12   No current facility-administered medications for this visit.     ALLERGIES: Review of patient's allergies indicates no known allergies.  Family History  Problem Relation Age of Onset  . Cancer Mother 45    bone ca  . Breast cancer Mother   . Heart failure Father 54  . Hypertension Father   . Stroke Maternal Grandmother   . Diabetes Maternal Grandfather   . Diabetes Paternal Grandmother   . Cancer Sister 72    endometrial cancer    History   Social History  . Marital Status: Married    Spouse Name: N/A    Number of Children: N/A  . Years of Education: N/A   Occupational History  . Not on file.   Social History Main Topics  . Smoking status: Never Smoker   . Smokeless tobacco: Not on file  . Alcohol Use: Yes     Comment: occ  glass of wine (2-3 glasses of wine per month)  . Drug Use: No  . Sexual Activity: No     Comment: [place]   Other Topics Concern  . Not on file   Social History Narrative  . No narrative on file    ROS:  Pertinent items are noted in HPI.  PHYSICAL EXAMINATION:    BP 120/70  Pulse 76  Ht 5' 6.5" (1.689 m)  Wt 189 lb (85.73 kg)  BMI 30.05 kg/m2  LMP 11/25/2013     General appearance: alert, cooperative and appears stated age  ASSESSMENT  Left ovarian cyst - probable hemorrhagic cyst. History of DVT.   Need for reliable contraception and good control of menses.  Breakthrough bleeding on OrthoMicronor. Family history of uterine cancer.   PLAN  Return for pelvic ultrasound in 6 weeks for ovarian recheck. Proceed with precert for Mirena IUD.  I discussed risks and benefits.  Patient understands that the Mirena IUD is not a treatment for ovarian cysts.  Handout on Mirena IUD.    An After Visit Summary was printed and given to the patient.  _30_____ minutes face to face time of which over 50% was spent in counseling.

## 2014-01-14 ENCOUNTER — Telehealth: Payer: Self-pay | Admitting: Obstetrics and Gynecology

## 2014-01-14 NOTE — Telephone Encounter (Signed)
Spoke with patient regarding scheduling of 6 week follow up PUS. Advised that patient liability will be $52.80. Scheduled procedure. Mailed the In-Office procedure form that includes appointment date and time, patient copay, and cancellation policy.

## 2014-01-17 ENCOUNTER — Encounter: Payer: Self-pay | Admitting: Family Medicine

## 2014-01-17 ENCOUNTER — Ambulatory Visit (INDEPENDENT_AMBULATORY_CARE_PROVIDER_SITE_OTHER): Payer: 59 | Admitting: Family Medicine

## 2014-01-17 VITALS — BP 120/76 | HR 78 | Temp 98.2°F | Resp 16 | Wt 190.1 lb

## 2014-01-17 DIAGNOSIS — F32A Depression, unspecified: Secondary | ICD-10-CM | POA: Insufficient documentation

## 2014-01-17 DIAGNOSIS — R635 Abnormal weight gain: Secondary | ICD-10-CM | POA: Insufficient documentation

## 2014-01-17 DIAGNOSIS — F329 Major depressive disorder, single episode, unspecified: Secondary | ICD-10-CM

## 2014-01-17 DIAGNOSIS — R5383 Other fatigue: Secondary | ICD-10-CM

## 2014-01-17 DIAGNOSIS — B009 Herpesviral infection, unspecified: Secondary | ICD-10-CM

## 2014-01-17 DIAGNOSIS — R5381 Other malaise: Secondary | ICD-10-CM | POA: Insufficient documentation

## 2014-01-17 DIAGNOSIS — F3289 Other specified depressive episodes: Secondary | ICD-10-CM

## 2014-01-17 LAB — CBC WITH DIFFERENTIAL/PLATELET
Basophils Absolute: 0 10*3/uL (ref 0.0–0.1)
Basophils Relative: 0.4 % (ref 0.0–3.0)
EOS ABS: 0.1 10*3/uL (ref 0.0–0.7)
EOS PCT: 1.6 % (ref 0.0–5.0)
HEMATOCRIT: 40.4 % (ref 36.0–46.0)
Hemoglobin: 13.6 g/dL (ref 12.0–15.0)
LYMPHS ABS: 2.2 10*3/uL (ref 0.7–4.0)
Lymphocytes Relative: 30 % (ref 12.0–46.0)
MCHC: 33.8 g/dL (ref 30.0–36.0)
MCV: 92.1 fl (ref 78.0–100.0)
MONO ABS: 0.5 10*3/uL (ref 0.1–1.0)
Monocytes Relative: 6.2 % (ref 3.0–12.0)
Neutro Abs: 4.6 10*3/uL (ref 1.4–7.7)
Neutrophils Relative %: 61.8 % (ref 43.0–77.0)
PLATELETS: 254 10*3/uL (ref 150.0–400.0)
RBC: 4.39 Mil/uL (ref 3.87–5.11)
RDW: 13.9 % (ref 11.5–15.5)
WBC: 7.4 10*3/uL (ref 4.0–10.5)

## 2014-01-17 LAB — BASIC METABOLIC PANEL
BUN: 11 mg/dL (ref 6–23)
CHLORIDE: 110 meq/L (ref 96–112)
CO2: 25 meq/L (ref 19–32)
Calcium: 9.2 mg/dL (ref 8.4–10.5)
Creatinine, Ser: 1 mg/dL (ref 0.4–1.2)
GFR: 65.78 mL/min (ref >60.00)
GLUCOSE: 71 mg/dL (ref 70–99)
POTASSIUM: 4 meq/L (ref 3.5–5.1)
Sodium: 139 mEq/L (ref 135–145)

## 2014-01-17 LAB — TSH: TSH: 0.33 u[IU]/mL — ABNORMAL LOW (ref 0.35–4.50)

## 2014-01-17 LAB — B12 AND FOLATE PANEL
Folate: >24.8 ng/mL (ref >5.9)
VITAMIN B 12: 584 pg/mL (ref 211–911)

## 2014-01-17 MED ORDER — TOPIRAMATE 100 MG PO TABS: ORAL_TABLET | ORAL | Status: DC

## 2014-01-17 MED ORDER — CITALOPRAM HYDROBROMIDE 20 MG PO TABS: 20.0000 mg | ORAL_TABLET | Freq: Every day | ORAL | Status: DC

## 2014-01-17 MED ORDER — BUPROPION HCL ER (XL) 150 MG PO TB24: 150.0000 mg | ORAL_TABLET | Freq: Every day | ORAL | Status: DC

## 2014-01-17 MED ORDER — VALACYCLOVIR HCL 500 MG PO TABS: 500.0000 mg | ORAL_TABLET | Freq: Every day | ORAL | Status: DC

## 2014-01-17 MED ORDER — BUTALBITAL-ASA-CAFFEINE 50-325-40 MG PO CAPS: 1.0000 | ORAL_CAPSULE | Freq: Three times a day (TID) | ORAL | Status: DC | PRN

## 2014-01-17 NOTE — Patient Instructions (Signed)
Follow up in 4-6 weeks to recheck fatigue, depression Increase your Celexa to 20mg  daily- 2 of what you have at home, 1 of the new script Try and use MyFitnessPal app to monitor what you are eating Continue your regular exercise We'll notify you of your lab results and make any changes if needed Call with any questions or concerns Hang in there!

## 2014-01-17 NOTE — Progress Notes (Signed)
Pre visit review using our clinic review tool, if applicable. No additional management support is needed unless otherwise documented below in the visit note. 

## 2014-01-17 NOTE — Progress Notes (Signed)
   Subjective:    Patient ID: Gwendolyn Shields, female    DOB: Nov 10, 1965, 48 y.o.   MRN: [id number]  HPI Weight gain- pt has gained 5 lbs since Nov.  Reports feeling bloated.  Exercising regularly x4 weeks- kickboxing.  Pt reports trying to cut back on calories (but has large [employer] sweet tea in office today).  Pt reports increased frustration w/ lack of weight loss and will then 'binge'.  Fatigue- 'i could sleep all day'.  sxs started 3 months ago.  On Armour thyroid daily.  Has increased Topamax due to HAs.  Pt admits to sadness and depression.  Will sleep well by report but wakes up not feeling rested.  Will occasionally snore but not regularly or loudly.   Review of Systems For ROS see HPI     Objective:   Physical Exam  Vitals reviewed. Constitutional: She is oriented to person, place, and time. She appears well-developed and well-nourished. No distress.  HENT:  Head: Normocephalic and atraumatic.  Eyes: Conjunctivae and EOM are normal. Pupils are equal, round, and reactive to light.  Neck: Normal range of motion. Neck supple. No thyromegaly present.  Cardiovascular: Normal rate, regular rhythm, normal heart sounds and intact distal pulses.   No murmur heard. Pulmonary/Chest: Effort normal and breath sounds normal. No respiratory distress.  Abdominal: Soft. She exhibits no distension. There is no tenderness.  Musculoskeletal: She exhibits no edema.  Lymphadenopathy:    She has no cervical adenopathy.  Neurological: She is alert and oriented to person, place, and time.  Skin: Skin is warm and dry.  Psychiatric: She has a normal mood and affect. Her behavior is normal.          Assessment & Plan:

## 2014-01-17 NOTE — Assessment & Plan Note (Signed)
Pt has gained 5 lbs in 6 months.  Has large sweet tea w/ her today from [employer].  Stressed need to pay close attention to diet and continue to exercise.  Will get labs to r/o underlying metabolic cause.  Will follow.

## 2014-01-17 NOTE — Assessment & Plan Note (Signed)
New.  Check labs to r/o metabolic cause (thyroid, anemia, electrolyte disturbance).  Encouraged healthy diet, regular exercise.  Will follow.

## 2014-01-17 NOTE — Assessment & Plan Note (Signed)
Deteriorated.  Increase Celexa to 20mg .  Monitor for improvement.  May be contributing to pt's fatigue.

## 2014-01-18 ENCOUNTER — Other Ambulatory Visit: Payer: Self-pay | Admitting: General Practice

## 2014-01-18 MED ORDER — THYROID 90 MG PO TABS: 90.0000 mg | ORAL_TABLET | Freq: Every day | ORAL | Status: DC

## 2014-01-21 ENCOUNTER — Telehealth: Payer: Self-pay | Admitting: Obstetrics and Gynecology

## 2014-01-21 NOTE — Telephone Encounter (Signed)
Patient returning Sabrina's call. °

## 2014-01-21 NOTE — Telephone Encounter (Signed)
Spoke with patient. Advised that per benefits quote received, IUD and insertion is covered at 100%. There will be 0 patient liability. Patient is to call within the first 5 days of her cycle to schedule insertion. °

## 2014-01-21 NOTE — Telephone Encounter (Signed)
Left message for patient to call back. Need to go over iud benefits °

## 2014-01-24 ENCOUNTER — Ambulatory Visit: Payer: 59

## 2014-01-28 ENCOUNTER — Ambulatory Visit: Payer: 59

## 2014-01-31 ENCOUNTER — Telehealth: Payer: Self-pay | Admitting: Family Medicine

## 2014-01-31 NOTE — Telephone Encounter (Signed)
Patient was instructed to call the office if symptoms worsen. JG//CMA

## 2014-01-31 NOTE — Telephone Encounter (Signed)
Called and spoke with patient to set up a medication follow up appointment. Appt was made for Friday 02/08/2014 @10 :45. JG//CMA

## 2014-01-31 NOTE — Telephone Encounter (Signed)
Patient thinks her medications may be interacting with her xarelto and causing headaches more frequently. She is requesting that someone go over her medications with her and determine if her meds need to be changed.

## 2014-02-07 ENCOUNTER — Ambulatory Visit
Admission: RE | Admit: 2014-02-07 | Discharge: 2014-02-07 | Disposition: A | Discharge status: Home / Self Care | Payer: 59 | Source: Ambulatory Visit

## 2014-02-07 DIAGNOSIS — Z1231 Encounter for screening mammogram for malignant neoplasm of breast: Secondary | ICD-10-CM

## 2014-02-08 ENCOUNTER — Ambulatory Visit (INDEPENDENT_AMBULATORY_CARE_PROVIDER_SITE_OTHER): Payer: 59 | Admitting: Family Medicine

## 2014-02-08 ENCOUNTER — Encounter: Payer: Self-pay | Admitting: Family Medicine

## 2014-02-08 VITALS — BP 120/70 | HR 72 | Temp 98.2°F | Resp 16 | Wt 191.2 lb

## 2014-02-08 DIAGNOSIS — F329 Major depressive disorder, single episode, unspecified: Secondary | ICD-10-CM

## 2014-02-08 DIAGNOSIS — F32A Depression, unspecified: Secondary | ICD-10-CM

## 2014-02-08 DIAGNOSIS — Z9189 Other specified personal risk factors, not elsewhere classified: Secondary | ICD-10-CM

## 2014-02-08 DIAGNOSIS — F3289 Other specified depressive episodes: Secondary | ICD-10-CM

## 2014-02-08 NOTE — Assessment & Plan Note (Signed)
Pt reports she is still extremely overwhelmed.  Under severe home stress.  Admits to needing both individual and family counseling.  Name and # of Clint Bolder given.  Will follow.

## 2014-02-08 NOTE — Patient Instructions (Signed)
Follow up as needed It is ok to take the Fiorinal intermittently as needed for headaches Please call if the tender headaches continues- we can always adjust meds as needed Please call and schedule an appt w/ Clint Bolder [phone].  She's fantastic! Call with any questions or concerns HANG IN THERE!!!

## 2014-02-08 NOTE — Progress Notes (Signed)
Pre visit review using our clinic review tool, if applicable. No additional management support is needed unless otherwise documented below in the visit note. 

## 2014-02-08 NOTE — Assessment & Plan Note (Signed)
Deteriorated.  Pt's sxs were different than they have ever been but she has not had any HAs this week.  Suspect that the pain was a transient side effect of increasing the Celexa.  Pt is asymptomatic in office today.  Discussed that despite the xarelto she can take Fiorinal as needed as long is it is not regularly- and to monitor closely for bleeding due to ASA component.  Reviewed supportive care and red flags that should prompt return.  Pt expressed understanding and is in agreement w/ plan.

## 2014-02-08 NOTE — Progress Notes (Signed)
   Subjective:    Patient ID: Gwendolyn Shields, female    DOB: 12/10/1965, 48 y.o.   MRN: [id number]  HPI HAs- pt reports no HAs this week but when she has been getting them, 'it's a totally different type of headache than I've ever had'.  Pt reports the head is TTP- 'like a nerve or a bruise'.  Was told by pharmacist when she picked up fiorinal not to take this b/c of the ASA interaction w/ xarelto.  Pt has hx of migraine.  These new HAs started ~5/10 which was shortly after increased celexa.  Pt does not have neuro.  Pt admits she has extreme stress at home- 'i've got one sick kid and one who's jealous'.   Review of Systems For ROS see HPI     Objective:   Physical Exam  Vitals reviewed. Constitutional: She is oriented to person, place, and time. She appears well-developed and well-nourished. No distress.  HENT:  Head: Normocephalic and atraumatic.  Right Ear: External ear normal.  Left Ear: External ear normal.  No TTP over temporal arteries bilaterally  Eyes: Conjunctivae and EOM are normal. Pupils are equal, round, and reactive to light.  Neck: Normal range of motion. Neck supple.  Cardiovascular: Normal rate, regular rhythm, normal heart sounds and intact distal pulses.   Pulmonary/Chest: Effort normal and breath sounds normal. No respiratory distress. She has no wheezes. She has no rales.  Lymphadenopathy:    She has no cervical adenopathy.  Neurological: She is alert and oriented to person, place, and time. No cranial nerve deficit. Coordination normal.  Skin: Skin is warm and dry.          Assessment & Plan:

## 2014-02-13 ENCOUNTER — Other Ambulatory Visit: Payer: Self-pay | Admitting: Family Medicine

## 2014-02-13 NOTE — Telephone Encounter (Signed)
Med filled.  

## 2014-02-21 ENCOUNTER — Ambulatory Visit (INDEPENDENT_AMBULATORY_CARE_PROVIDER_SITE_OTHER): Payer: 59 | Admitting: Obstetrics and Gynecology

## 2014-02-21 ENCOUNTER — Ambulatory Visit (INDEPENDENT_AMBULATORY_CARE_PROVIDER_SITE_OTHER): Payer: 59

## 2014-02-21 ENCOUNTER — Encounter: Payer: Self-pay | Admitting: Obstetrics and Gynecology

## 2014-02-21 ENCOUNTER — Ambulatory Visit: Payer: 59 | Admitting: Family Medicine

## 2014-02-21 VITALS — BP 110/74 | HR 64 | Ht 66.5 in | Wt 190.0 lb

## 2014-02-21 DIAGNOSIS — N83209 Unspecified ovarian cyst, unspecified side: Secondary | ICD-10-CM

## 2014-02-21 DIAGNOSIS — N83202 Unspecified ovarian cyst, left side: Secondary | ICD-10-CM

## 2014-02-21 DIAGNOSIS — Z309 Encounter for contraceptive management, unspecified: Secondary | ICD-10-CM

## 2014-02-21 NOTE — Progress Notes (Signed)
Subjective  48 year old [id number] Patient is here for follow up of a hemorrhagic left ovarian cyst measuring 26 x 22 mm on ultrasound on January 10, 2014. LLQ pain prompted the prior ultrasound.   Had a DVT on combined OCPs and was switched to Micronor.  Wants reliable contraception and is planning on a Mirena IUD in the fall.  Takes Xarelto 20 mg q day.  LMP uncertain.  Does not pay attention.  Patient states anxiety and fainting with procedures when she has pain or fear. Patient is not fearful of the procedure.  Does not want to have pain.   History of LEEP.  Objective  See ultrasound below - uterus normal, EMS 3.03 mm, no adnexal masses, left ovarian cyst resolved, collapsed left CL cyst 14 mm, mild clear free fluid.      Assessment   Left ovarian cyst resolved.  Need for reliable contraception.  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 Taking Micronor. On Xarelto.  Anxiety with procedures.   Plan  Discussion of Mirena IUD.  Risks, benefits, and alternatives reviewed.  Will plan for Cytotec 200 mcg the evening prior to placement and the morning of placement.  May have Xanax 0.25 mg 1 hour prior to procedure to treat anxiety.  Will need to sign consent form in advance and have ride home if plans for Xanax.  Will do paracervical block.  Does not need to stop Xarelto for procedure.  Call with menses. I told the patient some advance planning is welcome when trying to schedule the IUD insertion.   20 minutes face to face time of which over 50% was spent in counseling.   After visit summary to patient.

## 2014-03-07 ENCOUNTER — Telehealth: Payer: Self-pay | Admitting: Family Medicine

## 2014-03-07 MED ORDER — CITALOPRAM HYDROBROMIDE 20 MG PO TABS: 20.0000 mg | ORAL_TABLET | Freq: Every day | ORAL | Status: DC

## 2014-03-07 MED ORDER — BUPROPION HCL ER (XL) 150 MG PO TB24: 150.0000 mg | ORAL_TABLET | Freq: Every day | ORAL | Status: DC

## 2014-03-07 NOTE — Telephone Encounter (Signed)
Med filled.  

## 2014-03-07 NOTE — Telephone Encounter (Signed)
Caller name: Mattilyn Relation to pt: patient Call back number: [phone] Pharmacy: [id number] - [place], [place] - [address]   Reason for call:  Patient called to request a 90 day supply for citalopram (CELEXA) 20 MG tablet and buPROPion (WELLBUTRIN XL) 150 MG 24 hr tablet

## 2014-03-21 ENCOUNTER — Telehealth: Payer: Self-pay | Admitting: Family Medicine

## 2014-03-21 MED ORDER — TOPIRAMATE 100 MG PO TABS: ORAL_TABLET | ORAL | Status: DC

## 2014-03-21 MED ORDER — BUPROPION HCL ER (XL) 150 MG PO TB24: 150.0000 mg | ORAL_TABLET | Freq: Every day | ORAL | Status: DC

## 2014-03-21 NOTE — Telephone Encounter (Signed)
Caller name: Kiwana Relation to PY:PPJKDTO Call back number: [phone] [facility]acy: [facility]   Reason for call: to request refills for citalopram (CELEXA) 20 MG tablet,buPROPion (WELLBUTRIN XL) 150 MG 24 hr tablet, Nora-Be, and topiramate (TOPAMAX) 100 MG tablet All 90 day supply to [facility] mail order. Also patient stated that she would like a 30 day supply of Topamax called into [facility] in [place] because she is out of them.

## 2014-03-21 NOTE — Telephone Encounter (Signed)
meds filled as requested. Message left informing pt.

## 2014-04-19 ENCOUNTER — Other Ambulatory Visit: Payer: Self-pay | Admitting: General Practice

## 2014-04-19 ENCOUNTER — Ambulatory Visit (INDEPENDENT_AMBULATORY_CARE_PROVIDER_SITE_OTHER): Payer: 59 | Admitting: Family Medicine

## 2014-04-19 ENCOUNTER — Encounter: Payer: Self-pay | Admitting: Family Medicine

## 2014-04-19 ENCOUNTER — Encounter: Payer: Self-pay | Admitting: General Practice

## 2014-04-19 VITALS — BP 112/80 | HR 73 | Temp 98.0°F | Resp 16 | Wt 196.2 lb

## 2014-04-19 DIAGNOSIS — I82401 Acute embolism and thrombosis of unspecified deep veins of right lower extremity: Secondary | ICD-10-CM

## 2014-04-19 DIAGNOSIS — I82409 Acute embolism and thrombosis of unspecified deep veins of unspecified lower extremity: Secondary | ICD-10-CM

## 2014-04-19 DIAGNOSIS — F3289 Other specified depressive episodes: Secondary | ICD-10-CM

## 2014-04-19 DIAGNOSIS — E039 Hypothyroidism, unspecified: Secondary | ICD-10-CM

## 2014-04-19 DIAGNOSIS — F329 Major depressive disorder, single episode, unspecified: Secondary | ICD-10-CM

## 2014-04-19 DIAGNOSIS — F32A Depression, unspecified: Secondary | ICD-10-CM

## 2014-04-19 LAB — TSH: TSH: 0.7 u[IU]/mL (ref 0.35–4.50)

## 2014-04-19 MED ORDER — NORETHINDRONE 0.35 MG PO TABS: 1.0000 | ORAL_TABLET | Freq: Every day | ORAL | Status: DC

## 2014-04-19 MED ORDER — BUPROPION HCL ER (XL) 300 MG PO TB24: 300.0000 mg | ORAL_TABLET | Freq: Every day | ORAL | Status: DC

## 2014-04-19 NOTE — Progress Notes (Signed)
Pre visit review using our clinic review tool, if applicable. No additional management support is needed unless otherwise documented below in the visit note. 

## 2014-04-19 NOTE — Progress Notes (Signed)
   Subjective:    Patient ID: Gwendolyn Shields, female    DOB: 1965-12-22, 48 y.o.   MRN: [id number]  HPI DVT- pt interested in stopping Xarelto therapy if possible.  Pt had DVT 11/29/13  Depression- pt reports she has been off Celexa x2 weeks.  Pt feels she is hungry 'all the time'.  Reports mood is improving but fears that when school resumes things may worsen.  Hypothyroid- chronic problem.  Pt admits to 'self adjusting' Armour.  When she ran out of the 90mg  dose, she went up to the previous dose (120mg ).   Review of Systems For ROS see HPI     Objective:   Physical Exam  Vitals reviewed. Constitutional: She is oriented to person, place, and time. She appears well-developed and well-nourished. No distress.  HENT:  Head: Normocephalic and atraumatic.  Eyes: Conjunctivae and EOM are normal. Pupils are equal, round, and reactive to light.  Neck: Normal range of motion. Neck supple. No thyromegaly present.  Cardiovascular: Normal rate, regular rhythm, normal heart sounds and intact distal pulses.   No murmur heard. Pulmonary/Chest: Effort normal and breath sounds normal. No respiratory distress.  Abdominal: Soft. She exhibits no distension. There is no tenderness.  Musculoskeletal: She exhibits no edema and no tenderness.  Lymphadenopathy:    She has no cervical adenopathy.  Neurological: She is alert and oriented to person, place, and time.  Skin: Skin is warm and dry.  Psychiatric: She has a normal mood and affect. Her behavior is normal.          Assessment & Plan:

## 2014-04-19 NOTE — Patient Instructions (Signed)
Schedule your complete physical for November I sent the birth control to mail order I sent the new dose of Wellbutrin to the local pharmacy (to make sure it agrees w/ you) Increase the Wellbutrin to 300mg  daily- 2 tabs of what you have at home, 1 of the new script STOP the Celexa STOP the Xarelto- yay! Call with any questions or concerns Enjoy the rest of your summer!!!

## 2014-04-21 NOTE — Assessment & Plan Note (Signed)
Pt has completed 4.5 months of anticoagulation.  Will stop Xarelto.  Pt expressed understanding and is in agreement w/ plan.

## 2014-04-21 NOTE — Assessment & Plan Note (Signed)
Pt has been off Celexa x2 weeks (forgot to refill it).  Reports that mood was improving on medication but it caused increased hunger and weight gain.  Will hold Celexa for now, but will increase Wellbutrin to 300mg  daily.  Will follow.

## 2014-04-21 NOTE — Assessment & Plan Note (Signed)
Chronic problem.  Pt admits to self adjusting medication.  Check TSH.  Adjust meds prn.

## 2014-04-25 ENCOUNTER — Telehealth: Payer: Self-pay | Admitting: Family Medicine

## 2014-04-25 NOTE — Telephone Encounter (Signed)
Caller name: Shriya  Relation to pt: self Call back number: [phone]    Reason for call: Pt is inquring about lab results taken 04/19/2014

## 2014-04-25 NOTE — Telephone Encounter (Signed)
Pt notified of results. Letter had been mailed.

## 2014-05-27 ENCOUNTER — Encounter: Payer: Self-pay | Admitting: Obstetrics and Gynecology

## 2014-06-19 ENCOUNTER — Telehealth: Payer: Self-pay | Admitting: Family Medicine

## 2014-06-19 MED ORDER — THYROID 90 MG PO TABS: 90.0000 mg | ORAL_TABLET | Freq: Every day | ORAL | Status: DC

## 2014-06-19 NOTE — Telephone Encounter (Signed)
Caller name: [name]  Relation to pt: self  Call back number: [phone] [facility]acy[facility] [place], [place] [phone]     Reason for call: pt requesting a 90 day refill of thyroid (ARMOUR) 90 MG tablet

## 2014-06-19 NOTE — Telephone Encounter (Signed)
Med filled.  

## 2014-07-01 ENCOUNTER — Telehealth: Payer: Self-pay | Admitting: Family Medicine

## 2014-07-01 NOTE — Telephone Encounter (Signed)
Caller name: Tangela Relation to pt: Call back [phone] or [phone]   Reason for call:  Wants to consult about appointments - weight loss appointments - wants to discuss before she makes an appointment.

## 2014-07-01 NOTE — Telephone Encounter (Signed)
Spoke with pt. Who only wanted a weight loss appt. This was scheduled for pt.

## 2014-07-04 ENCOUNTER — Telehealth: Payer: Self-pay | Admitting: Obstetrics and Gynecology

## 2014-07-04 DIAGNOSIS — Z0189 Encounter for other specified special examinations: Secondary | ICD-10-CM

## 2014-07-04 DIAGNOSIS — Z30430 Encounter for insertion of intrauterine contraceptive device: Secondary | ICD-10-CM

## 2014-07-04 NOTE — Telephone Encounter (Signed)
Left message to call Trinitee Horgan at [phone]. 

## 2014-07-04 NOTE — Telephone Encounter (Signed)
Patient requesting to speak with nurse about questions she has about "possibly getting an IUD."

## 2014-07-05 ENCOUNTER — Other Ambulatory Visit: Payer: Self-pay | Admitting: Obstetrics and Gynecology

## 2014-07-05 MED ORDER — MISOPROSTOL 200 MCG PO TABS: ORAL_TABLET | ORAL | Status: DC

## 2014-07-05 MED ORDER — ALPRAZOLAM 0.25 MG PO TABS: ORAL_TABLET | ORAL | Status: DC

## 2014-07-05 NOTE — Telephone Encounter (Signed)
Spoke with patient. Patient states "I can't remember how I am supposed to get scheduled for my IUD insertion. I assume I am not supposed to be on my cycle." Advised patient will need to call with first day of menses to schedule insertion within first 7 days of cycle. "What if my cycle is irregular?" States that LMP was four weeks ago but that she may or may not have a cycle during the month. Also has fear of passing out with procedure and would like husband present. "He will have to take off work and I have to give advance notice for that. I don't know how I will schedule that with my cycle." Advised patient will need to speak with Dr.Silva about how we need to proceed with scheduling and return call. Patient is agreeable. "I am not sexually active because I am scared to get pregnant so there is no chance of pregnancy." Advised patient will speak with Dr.Silva and return call to schedule. Patient is agreeable.  Dr.Silva, okay to have patient come in for quant hcg 2 days prior to insertion?

## 2014-07-05 NOTE — Telephone Encounter (Signed)
I just ordered Rx for Xanax. This will need to be faxed to her pharmacy on 10/26.

## 2014-07-05 NOTE — Telephone Encounter (Signed)
Returning a call to [name]. °

## 2014-07-05 NOTE — Telephone Encounter (Signed)
Left message to call [name] at [phone]. 

## 2014-07-05 NOTE — Telephone Encounter (Signed)
Spoke with patient. Advised patient of message as seen below from [place]. Patient is agreeable and verbalizes understanding. Lab appointment scheduled for 10/4 at 10:30am. IUD insertion scheduled for 11/6 at 3pm with Dr.Silva. Patient is agreeable to both dates and times. Cytotec instructions given.Take one tablet the night before procedure and one tablet the morning of procedure. Cytotec 241mcg #2 0RF sent to pharmacy on file. Patient would like to have xanax for appointment as well. This was discussed with Dr.Silva at consult appointment. Advised patient will need to arrive one hour early for appointment to sign consent forms and receive medication. Patient will also need someone to accompany her to drive her home. Patient is agreeable and verbalizes understanding. Pre procedure instructions given.  Motrin instructions given. Motrin=Advil=Ibuprofen, 800 mg one hour before appointment. Eat a meal and hydrate well before appointment. Patient is no longer taking Xarelto and is not currently on any other form of blood thinner.  Routing to provider for final review. Patient agreeable to disposition. Will close encounter

## 2014-07-05 NOTE — Telephone Encounter (Signed)
Pt is calling kaitlyn back

## 2014-07-05 NOTE — Telephone Encounter (Signed)
Ok for beta HCG 2 days prior to insertion and abstain from that point until the insertion.   Will need to stop the Xarelto in preparation for the IUD placement.  Please contact her PCP to get recommendations for this.  I am expecting that they will recommend stopping the Xarelto 24 hours prior to procedure and then resuming 24 hours after the procedure.   Thank you.

## 2014-07-10 ENCOUNTER — Encounter: Payer: Self-pay | Admitting: Family Medicine

## 2014-07-10 ENCOUNTER — Ambulatory Visit (INDEPENDENT_AMBULATORY_CARE_PROVIDER_SITE_OTHER): Payer: 59 | Admitting: Family Medicine

## 2014-07-10 VITALS — BP 124/80 | HR 74 | Temp 98.2°F | Resp 16 | Ht 66.5 in | Wt 204.1 lb

## 2014-07-10 DIAGNOSIS — R635 Abnormal weight gain: Secondary | ICD-10-CM

## 2014-07-10 DIAGNOSIS — Z23 Encounter for immunization: Secondary | ICD-10-CM

## 2014-07-10 MED ORDER — PHENTERMINE HCL 37.5 MG PO CAPS: 37.5000 mg | ORAL_CAPSULE | ORAL | Status: DC

## 2014-07-10 NOTE — Patient Instructions (Signed)
Follow up in 6 weeks once you start the medication to recheck BP and HR on phentermine Once you start the medication, make sure you are working on healthy diet and regular exercise You can totally do this!!! Call with any questions or concerns Happy [birth year] Birthday!!!

## 2014-07-10 NOTE — Progress Notes (Signed)
Pre visit review using our clinic review tool, if applicable. No additional management support is needed unless otherwise documented below in the visit note. 

## 2014-07-10 NOTE — Progress Notes (Signed)
   Subjective:    Patient ID: Gwendolyn Shields, female    DOB: 11/26/1965, 48 y.o.   MRN: [id number]  HPI Weight gain- pt has gained another 8 lbs.  Worked on diet x3 weeks and lost 1 lb.  Very discouraged and then quit her diet.  Was previously exercising- kickboxing, 'i enjoyed that a lot but my membership ran out'.  'i know how to eat healthy', has done [employer] for years.   Review of Systems For ROS see HPI     Objective:   Physical Exam  Vitals reviewed. Constitutional: She is oriented to person, place, and time. She appears well-developed and well-nourished. No distress.  HENT:  Head: Normocephalic and atraumatic.  Eyes: Conjunctivae and EOM are normal. Pupils are equal, round, and reactive to light.  Neck: Normal range of motion. Neck supple. No thyromegaly present.  Cardiovascular: Normal rate, regular rhythm, normal heart sounds and intact distal pulses.   No murmur heard. Pulmonary/Chest: Effort normal and breath sounds normal. No respiratory distress.  Abdominal: Soft. She exhibits no distension. There is no tenderness.  Musculoskeletal: She exhibits no edema.  Lymphadenopathy:    She has no cervical adenopathy.  Neurological: She is alert and oriented to person, place, and time.  Skin: Skin is warm and dry.  Psychiatric: She has a normal mood and affect. Her behavior is normal.          Assessment & Plan:

## 2014-07-10 NOTE — Assessment & Plan Note (Signed)
Deteriorated.  Pt continues to gain weight.  Was very frustrated by her lack of results when she worked hard x3 weeks on diet and exercise.  Reverted to bad habits.  Not interested in nutrition referral- 'i know how to eat'.  Stressed need for healthy food choices and regular exercise.  Start short course of phentermine to boost metabolism.  Will follow closely.

## 2014-07-15 ENCOUNTER — Encounter: Payer: Self-pay | Admitting: Family Medicine

## 2014-07-15 NOTE — Telephone Encounter (Signed)
Spoke with patient. Advised rx for xanax faxed by Estill Bamberg, CMA. Patient will need to pick rx up and bring to office one hour before appointment time for IUD insertion. "So I don't take any at home?" Advised patient not to take any at home. Will need to sign consent forms before taking medication. Patient is agreeable and verbalizes understanding.  Routing to provider for final review. Patient agreeable to disposition. Will close encounter

## 2014-07-17 ENCOUNTER — Other Ambulatory Visit: Payer: 59

## 2014-07-17 DIAGNOSIS — Z0189 Encounter for other specified special examinations: Secondary | ICD-10-CM

## 2014-07-17 DIAGNOSIS — Z0000 Encounter for general adult medical examination without abnormal findings: Secondary | ICD-10-CM

## 2014-07-18 ENCOUNTER — Telehealth: Payer: Self-pay | Admitting: Family Medicine

## 2014-07-18 LAB — HCG, QUANTITATIVE, PREGNANCY: hCG, Beta Chain, Quant, S: <2 m[IU]/mL

## 2014-07-18 MED ORDER — TOPIRAMATE 100 MG PO TABS: ORAL_TABLET | ORAL | Status: DC

## 2014-07-18 NOTE — Telephone Encounter (Signed)
Med filled.  

## 2014-07-18 NOTE — Telephone Encounter (Signed)
Last OV 10-28 topamax last filled 03/21/14 #60 with 0   Please advise if ok to fill

## 2014-07-18 NOTE — Telephone Encounter (Signed)
Ok for #60, 1 refill 

## 2014-07-18 NOTE — Telephone Encounter (Signed)
Pt needs new rx for topiramate (TOPAMAX) 100 MG tablet, pt has no more refills, pt has appt for CPE on 08/13/14, wants to know if dr. Birdie Riddle can refill 1 month until her appt. Pt uses [facility].

## 2014-07-19 ENCOUNTER — Ambulatory Visit (INDEPENDENT_AMBULATORY_CARE_PROVIDER_SITE_OTHER): Payer: 59 | Admitting: Obstetrics and Gynecology

## 2014-07-19 ENCOUNTER — Encounter: Payer: Self-pay | Admitting: Obstetrics and Gynecology

## 2014-07-19 DIAGNOSIS — Z30430 Encounter for insertion of intrauterine contraceptive device: Secondary | ICD-10-CM

## 2014-07-19 HISTORY — PX: INTRAUTERINE DEVICE (IUD) INSERTION: SHX5877

## 2014-07-19 NOTE — Patient Instructions (Signed)

## 2014-07-24 NOTE — Progress Notes (Signed)
Patient ID: Gwendolyn Shields, female   DOB: 1966/01/06, 48 y.o.   MRN: [id number] GYNECOLOGY  VISIT   HPI: 48 y.o.   Married  Caucasian  female   [phone] with Patient's last menstrual period was 07/05/2014 (exact date).   here for Mirena IUD insertion.  Serum beta HCG negative 07/17/14.  Patient arrived 1 hour prior to procedure to take Xanax 0.25 mg which she brought from home.  Patient signed consent for Mirena IUD insertion and then she took 1 Xanax 0.25 mg. Cytotec taken last hs and this am.   GYNECOLOGIC HISTORY: Patient's last menstrual period was 07/05/2014 (exact date). Contraception:  OCP's - Micronor.  Menopausal hormone therapy: n/a        OB History    Gravida Para Term Preterm AB TAB SAB Ectopic Multiple Living   4 2 2  2  2   2          Patient Active Problem List   Diagnosis Date Noted  . Unspecified hypothyroidism 04/19/2014  . Weight gain 01/17/2014  . Other malaise and fatigue 01/17/2014  . Depression 01/17/2014  . DVT (deep venous thrombosis) 11/29/2013  . 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 Pain of right lower leg 11/28/2013  . Routine general medical examination at a health care facility 07/20/2013  . Fungal dermatitis 07/20/2013  . Shoulder blade pain 07/20/2013  . CERVICAL CANCER 05/21/2010  . ASTHMA, UNSPECIFIED 05/21/2010  . DYSPNEA 05/21/2010  . HEADACHE, CHRONIC, HX OF 05/21/2010    Past Medical History  Diagnosis Date  . Depression   . STD (sexually transmitted disease)     HSV 2  . Migraine   . DVT (deep venous thrombosis)     Past Surgical History  Procedure Laterality Date  . Cervical biopsy  w/ loop electrode excision      83yrs ago  . Breast biopsy Left 2001    benign  . Dilation and curettage of uterus      age 48    Current Outpatient Prescriptions  Medication Sig Dispense Refill  . ALPRAZolam (XANAX) 0.25 MG tablet Take 1 tablet (0.25 mg) prior to procedure.  Bring both tablets to office for procedure. 2  tablet 0  . buPROPion (WELLBUTRIN XL) 300 MG 24 hr tablet Take 1 tablet (300 mg total) by mouth daily. 30 tablet 3  . butalbital-aspirin-caffeine (FIORINAL) 50-325-40 MG per capsule Take 1 capsule by mouth 3 (three) times daily as needed for headache. 45 capsule 1  . misoprostol (CYTOTEC) 200 MCG tablet Take one tablet the night before procedure and one tablet the morning of procedure. 2 tablet 0  . norethindrone (NORA-BE) 0.35 MG tablet Take 1 tablet (0.35 mg total) by mouth daily. 3 Package 3  . phentermine 37.5 MG capsule Take 1 capsule (37.5 mg total) by mouth every morning. 30 capsule 2  . thyroid (ARMOUR) 90 MG tablet Take 1 tablet (90 mg total) by mouth daily. 90 tablet 1  . topiramate (TOPAMAX) 100 MG tablet 2 tabs PO daily (200mg ) 60 tablet 1  . valACYclovir (VALTREX) 500 MG tablet Take 1 tablet (500 mg total) by mouth daily. 1 tablet po QD. 30 tablet 12   No current facility-administered medications for this visit.     ALLERGIES: Review of patient's allergies indicates no known allergies.  Family History  Problem Relation Age of Onset  . Cancer Mother 68    bone ca  . Breast cancer Mother   . Heart failure Father  46  . Hypertension Father   . Stroke Maternal Grandmother   . Diabetes Maternal Grandfather   . Diabetes Paternal Grandmother   . Cancer Sister 66    endometrial cancer    History   Social History  . Marital Status: Married    Spouse Name: N/A    Number of Children: N/A  . Years of Education: N/A   Occupational History  . Not on file.   Social History Main Topics  . Smoking status: Never Smoker   . Smokeless tobacco: Not on file  . Alcohol Use: 0.0 oz/week    0 Not specified per week     Comment: occ glass of wine (2-3 glasses of wine per month)  . Drug Use: No  . Sexual Activity: No     Comment: Nora-BE   Other Topics Concern  . Not on file   Social History Narrative    ROS:  Pertinent items are noted in HPI.  PHYSICAL EXAMINATION:    BP  110/66 mmHg  Pulse 76  Ht 5' 6.5" (1.689 m)  Wt 198 lb 12.8 oz (90.175 kg)  BMI 31.61 kg/m2  LMP 07/05/2014 (Exact Date)     General appearance: alert, cooperative and appears stated age  Pelvic: External genitalia:  no lesions              Urethra:  normal appearing urethra with no masses, tenderness or lesions              Bartholins and Skenes: normal                 Vagina: normal appearing vagina with normal color and discharge, no lesions              Cervix: normal appearance                   Bimanual Exam:  Uterus:  uterus is normal size, shape, consistency and nontender                                      Adnexa: normal adnexa in size, nontender and no masses                                      Mirena IUD insertion - Lot number:  __[id number]__, Expiration:  __4/18__ Consent for procedure.  Speculum placed in the vagina.  Sterile prep of cervix with Hibiclens.  Paracervical block with 1% lidocaine - Lot number [id number], Expiration 02/12/15 Tenaculum to anterior cervical lip.  Uterus sounded to 8 cm.  Mirena IUD placed without difficulty.  Strings trimmed. No complication.  Minimal EBL.   ASSESSMENT  Mirena IUD placement.   PLAN  Instructions and precautions given.  Follow up in 4 weeks.      An After Visit Summary was printed and given to the patient.  __15____ minutes face to face time of which over 50% was spent in counseling.

## 2014-08-13 ENCOUNTER — Ambulatory Visit (INDEPENDENT_AMBULATORY_CARE_PROVIDER_SITE_OTHER): Payer: 59 | Admitting: Family Medicine

## 2014-08-13 ENCOUNTER — Encounter: Payer: Self-pay | Admitting: Family Medicine

## 2014-08-13 VITALS — BP 112/70 | HR 72 | Temp 98.1°F | Resp 16 | Ht 66.25 in | Wt 192.4 lb

## 2014-08-13 DIAGNOSIS — B009 Herpesviral infection, unspecified: Secondary | ICD-10-CM

## 2014-08-13 DIAGNOSIS — Z0000 Encounter for general adult medical examination without abnormal findings: Secondary | ICD-10-CM

## 2014-08-13 DIAGNOSIS — E785 Hyperlipidemia, unspecified: Secondary | ICD-10-CM

## 2014-08-13 DIAGNOSIS — G43009 Migraine without aura, not intractable, without status migrainosus: Secondary | ICD-10-CM

## 2014-08-13 HISTORY — DX: Hyperlipidemia, unspecified: E78.5

## 2014-08-13 LAB — LIPID PANEL
Cholesterol: 230 mg/dL — ABNORMAL HIGH (ref 0–200)
HDL: 50.3 mg/dL (ref >39.00)
LDL Cholesterol: 159 mg/dL — ABNORMAL HIGH (ref 0–99)
NONHDL: 179.7
Total CHOL/HDL Ratio: 5
Triglycerides: 104 mg/dL (ref 0.0–149.0)
VLDL: 20.8 mg/dL (ref 0.0–40.0)

## 2014-08-13 LAB — BASIC METABOLIC PANEL
BUN: 9 mg/dL (ref 6–23)
CHLORIDE: 111 meq/L (ref 96–112)
CO2: 22 mEq/L (ref 19–32)
CREATININE: 1 mg/dL (ref 0.4–1.2)
Calcium: 9.5 mg/dL (ref 8.4–10.5)
GFR: 61.89 mL/min (ref >60.00)
Glucose, Bld: 99 mg/dL (ref 70–99)
Potassium: 4.3 mEq/L (ref 3.5–5.1)
Sodium: 139 mEq/L (ref 135–145)

## 2014-08-13 LAB — CBC WITH DIFFERENTIAL/PLATELET
Basophils Absolute: 0 10*3/uL (ref 0.0–0.1)
Basophils Relative: 0.4 % (ref 0.0–3.0)
EOS ABS: 0.1 10*3/uL (ref 0.0–0.7)
Eosinophils Relative: 1.5 % (ref 0.0–5.0)
HCT: 43.5 % (ref 36.0–46.0)
Hemoglobin: 14.4 g/dL (ref 12.0–15.0)
Lymphocytes Relative: 21.9 % (ref 12.0–46.0)
Lymphs Abs: 1.9 10*3/uL (ref 0.7–4.0)
MCHC: 33 g/dL (ref 30.0–36.0)
MCV: 91.4 fl (ref 78.0–100.0)
MONO ABS: 0.4 10*3/uL (ref 0.1–1.0)
Monocytes Relative: 4.9 % (ref 3.0–12.0)
NEUTROS PCT: 71.3 % (ref 43.0–77.0)
Neutro Abs: 6.1 10*3/uL (ref 1.4–7.7)
PLATELETS: 282 10*3/uL (ref 150.0–400.0)
RBC: 4.76 Mil/uL (ref 3.87–5.11)
RDW: 12.7 % (ref 11.5–15.5)
WBC: 8.5 10*3/uL (ref 4.0–10.5)

## 2014-08-13 LAB — VITAMIN D 25 HYDROXY (VIT D DEFICIENCY, FRACTURES): VITD: 35.97 ng/mL (ref 30.00–100.00)

## 2014-08-13 LAB — HEPATIC FUNCTION PANEL
ALK PHOS: 69 U/L (ref 39–117)
ALT: 17 U/L (ref 0–35)
AST: 21 U/L (ref 0–37)
Albumin: 4.3 g/dL (ref 3.5–5.2)
BILIRUBIN DIRECT: 0 mg/dL (ref 0.0–0.3)
Total Bilirubin: 0.5 mg/dL (ref 0.2–1.2)
Total Protein: 7.1 g/dL (ref 6.0–8.3)

## 2014-08-13 LAB — TSH: TSH: 2.55 u[IU]/mL (ref 0.35–4.50)

## 2014-08-13 MED ORDER — BUTALBITAL-ASA-CAFFEINE 50-325-40 MG PO CAPS: 1.0000 | ORAL_CAPSULE | Freq: Three times a day (TID) | ORAL | Status: DC | PRN

## 2014-08-13 MED ORDER — BUPROPION HCL ER (XL) 300 MG PO TB24: 300.0000 mg | ORAL_TABLET | Freq: Every day | ORAL | Status: DC

## 2014-08-13 MED ORDER — TOPIRAMATE 100 MG PO TABS: ORAL_TABLET | ORAL | Status: DC

## 2014-08-13 MED ORDER — PROCHLORPERAZINE MALEATE 10 MG PO TABS: 10.0000 mg | ORAL_TABLET | Freq: Four times a day (QID) | ORAL | Status: DC | PRN

## 2014-08-13 MED ORDER — VALACYCLOVIR HCL 500 MG PO TABS: 500.0000 mg | ORAL_TABLET | Freq: Every day | ORAL | Status: DC

## 2014-08-13 MED ORDER — THYROID 90 MG PO TABS: 90.0000 mg | ORAL_TABLET | Freq: Every day | ORAL | Status: DC

## 2014-08-13 NOTE — Assessment & Plan Note (Signed)
Pt's PE WNL w/ exception of obesity.  UTD on GYN.  Check labs.  Anticipatory guidance provided.  

## 2014-08-13 NOTE — Progress Notes (Signed)
Pre visit review using our clinic review tool, if applicable. No additional management support is needed unless otherwise documented below in the visit note. 

## 2014-08-13 NOTE — Progress Notes (Signed)
   Subjective:    Patient ID: Gwendolyn Shields, female    DOB: 24-May-1966, 48 y.o.   MRN: [id number]  HPI CPE- UTD on GYN Quincy Simmonds)   Review of Systems Patient reports no vision/ hearing changes, adenopathy,fever, weight change,  persistant/recurrent hoarseness , swallowing issues, chest pain, palpitations, edema, persistant/recurrent cough, hemoptysis, dyspnea (rest/exertional/paroxysmal nocturnal), gastrointestinal bleeding (melena, rectal bleeding), abdominal pain, significant heartburn, GU symptoms (dysuria, hematuria, incontinence), Gyn symptoms (abnormal  bleeding, pain),  syncope, focal weakness, memory loss, numbness & tingling, skin/hair/nail changes, abnormal bruising or bleeding, anxiety, or depression.  + constipation x4 weeks.  BMs once weekly w/o assistance of laxative. + HAs- chronic problem, worsened since IUD insertion 4 weeks ago.  No relief w/ Butalbital.  Daughter has chronic daily HAs and she has used some of her Compazine w/ good relief of sxs.     Objective:   Physical Exam General Appearance:    Alert, cooperative, no distress, appears stated age  Head:    Normocephalic, without obvious abnormality, atraumatic  Eyes:    PERRL, conjunctiva/corneas clear, EOM's intact, fundi    benign, both eyes  Ears:    Normal TM's and external ear canals, both ears  Nose:   Nares normal, septum midline, mucosa normal, no drainage    or sinus tenderness  Throat:   Lips, mucosa, and tongue normal; teeth and gums normal  Neck:   Supple, symmetrical, trachea midline, no adenopathy;    Thyroid: no enlargement/tenderness/nodules  Back:     Symmetric, no curvature, ROM normal, no CVA tenderness  Lungs:     Clear to auscultation bilaterally, respirations unlabored  Chest Wall:    No tenderness or deformity   Heart:    Regular rate and rhythm, S1 and S2 normal, no murmur, rub   or gallop  Breast Exam:    Deferred to GYN  Abdomen:     Soft, non-tender, bowel sounds active all four  quadrants,    no masses, no organomegaly  Genitalia:    Deferred to GYN  Rectal:    Extremities:   Extremities normal, atraumatic, no cyanosis or edema  Pulses:   2+ and symmetric all extremities  Skin:   Skin color, texture, turgor normal, no rashes or lesions  Lymph nodes:   Cervical, supraclavicular, and axillary nodes normal  Neurologic:   CNII-XII intact, normal strength, sensation and reflexes    throughout          Assessment & Plan:

## 2014-08-13 NOTE — Assessment & Plan Note (Signed)
Deteriorated since placement of IUD.  No relief w/ butalbital- good relief w/ daughter's compazine.  Script provided.  Will follow.

## 2014-08-13 NOTE — Patient Instructions (Signed)
Follow up in 3 months to recheck weight loss progress We'll notify you of your lab results and make any changes if needed Continue to make healthy food choices and get regular exercise Add Miralax daily to keep bowels moving Call with any questions or concerns Happy Holidays!

## 2014-08-15 ENCOUNTER — Other Ambulatory Visit: Payer: Self-pay | Admitting: General Practice

## 2014-08-15 DIAGNOSIS — E785 Hyperlipidemia, unspecified: Secondary | ICD-10-CM

## 2014-08-15 MED ORDER — ATORVASTATIN CALCIUM 20 MG PO TABS: 20.0000 mg | ORAL_TABLET | Freq: Every day | ORAL | Status: DC

## 2014-08-20 ENCOUNTER — Telehealth: Payer: Self-pay | Admitting: Family Medicine

## 2014-08-20 NOTE — Telephone Encounter (Signed)
Pt notified that this medication was sent to her mail order pharmacy.

## 2014-08-20 NOTE — Telephone Encounter (Addendum)
Pt checking the status cholesterol medication refill. Please advise

## 2014-08-21 ENCOUNTER — Ambulatory Visit (INDEPENDENT_AMBULATORY_CARE_PROVIDER_SITE_OTHER): Payer: 59 | Admitting: Obstetrics and Gynecology

## 2014-08-21 ENCOUNTER — Encounter: Payer: Self-pay | Admitting: Obstetrics and Gynecology

## 2014-08-21 VITALS — BP 116/80 | HR 72 | Ht 66.25 in | Wt 193.0 lb

## 2014-08-21 DIAGNOSIS — N898 Other specified noninflammatory disorders of vagina: Secondary | ICD-10-CM

## 2014-08-21 DIAGNOSIS — Z30431 Encounter for routine checking of intrauterine contraceptive device: Secondary | ICD-10-CM

## 2014-08-21 DIAGNOSIS — N9489 Other specified conditions associated with female genital organs and menstrual cycle: Secondary | ICD-10-CM

## 2014-08-21 MED ORDER — METRONIDAZOLE 0.75 % VA GEL: 1.0000 | Freq: Every day | VAGINAL | Status: DC

## 2014-08-21 NOTE — Patient Instructions (Signed)
Metronidazole vaginal gel What is this medicine? METRONIDAZOLE (me troe NI da zole) VAGINAL GEL is an antiinfective. It is used to treat bacterial vaginitis. This medicine may be used for other purposes; ask your health care provider or pharmacist if you have questions. COMMON BRAND NAME(S): MetroGel, MetroGel Vaginal, MetroGel-Vaginal, NUVESSA, Vandazole What should I tell my health care provider before I take this medicine? They need to know if you have any of these conditions: -if you drink alcohol containing drinks -if you have taken disulfiram in the past two weeks -liver disease -peripheral neuropathy -seizures -an unusual or allergic reaction to metronidazole, parabens, nitroimidazoles, or other medicines, foods, dyes, or preservatives -pregnant or trying to get pregnant -breast-feeding How should I use this medicine? This medicine is only for use in the vagina. Do not take by mouth or apply to other areas of the body. Follow the directions on the prescription label. Wash hands before and after use. Screw the applicator to the tube and squeeze the tube gently to fill the applicator. Lie on your back, part and bend your knees. Insert the applicator tip high in the vagina and push the plunger to release the gel into the vagina. Gently remove the applicator. Wash the applicator well with warm water and soap. Use at regular intervals. Finish the full course prescribed by your doctor or health care professional even if you think your condition is better. Do not stop using except on the advice of your doctor or health care professional. Talk to your pediatrician regarding the use of this medicine in children. Special care may be needed. Overdosage: If you think you have taken too much of this medicine contact a poison control center or emergency room at once. NOTE: This medicine is only for you. Do not share this medicine with others. What if I miss a dose? If you miss a dose, use it as soon as  you can. If it is almost time for your next dose, use only that dose. Do not use double or extra doses. What may interact with this medicine? Do not take this medicine with any of the following medications: -alcohol or any product that contains alcohol -cisapride -dofetilide -dronedarone -pimozide -thioridazine -ziprasidone This medicine may also interact with the following medications: -cimetidine -lithium -other medicines that prolong the QT interval (cause an abnormal heart rhythm) -warfarin This list may not describe all possible interactions. Give your health care provider a list of all the medicines, herbs, non-prescription drugs, or dietary supplements you use. Also tell them if you smoke, drink alcohol, or use illegal drugs. Some items may interact with your medicine. What should I watch for while using this medicine? Tell your doctor or health care professional if your symptoms do not start to get better in 2 or 3 days. Avoid alcoholic drinks while you are taking this medicine and for three days afterwards. Alcohol may make you feel dizzy, sick, or flushed. You may get drowsy or dizzy. Do not drive, use machinery, or do anything that needs mental alertness until you know how this medicine affects you. To reduce the risk of dizzy or fainting spells, do not sit or stand up quickly, especially if you are an older patient. Your clothing may get soiled if you have a vaginal discharge. You can wear a sanitary napkin. Do not use tampons. Wear freshly washed cotton, not synthetic, panties. Do not have sex until you have finished your treatment. Having sex can make the treatment less effective. Your sexual partner  may also need treatment. What side effects may I notice from receiving this medicine? Side effects that you should report to your doctor or health care professional as soon as possible: -dizziness -frequent passing of urine -headache -loss of appetite -nausea -skin rash,  itching -stomach pain or cramps -vaginal irritation or discharge -vulvar burning or swelling Side effects that usually do not require medical attention (report to your doctor or health care professional if they continue or are bothersome): -dark urine -mild vaginal burning This list may not describe all possible side effects. Call your doctor for medical advice about side effects. You may report side effects to FDA at 1-800-FDA-1088. Where should I keep my medicine? Keep out of the reach of children. Store at room temperature between 15 and 30 degrees C (59 and 86 degrees F). Do not freeze. Throw away any unused medicine after the expiration date. NOTE: This sheet is a summary. It may not cover all possible information. If you have questions about this medicine, talk to your doctor, pharmacist, or health care provider.  2015, [employer]vier/Gold Standard. (2013-04-06 14:09:23)

## 2014-08-21 NOTE — Progress Notes (Signed)
GYNECOLOGY  VISIT   HPI: 48 y.o.   Married  Caucasian  female   [phone] with Patient's last menstrual period was 08/15/2014.   here for   IUD recheck. Mirena IUD placed 07/19/14 without difficulty.  Notes some some odor.  No discharge.  Finished a menses now, was lighter.  Having significant constipation and taking Miralax daily.  Finally started working yesterday.  Having an increase in headaches and migraines.  These felt different.   Treated with Benjaman Lobe and compazine.   Is taking PCN for pain following a root canal.   GYNECOLOGIC HISTORY: Patient's last menstrual period was 08/15/2014. Contraception:   Mirena Menopausal hormone therapy: NA        OB History    Gravida Para Term Preterm AB TAB SAB Ectopic Multiple Living   4 2 2  2  2   2          Patient Active Problem List   Diagnosis Date Noted  . Preventative health care 08/13/2014  . Unspecified hypothyroidism 04/19/2014  . Weight gain 01/17/2014  . Other malaise and fatigue 01/17/2014  . Depression 01/17/2014  . DVT (deep venous thrombosis) 11/29/2013  . 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 Pain of right lower leg 11/28/2013  . Routine general medical examination at a health care facility 07/20/2013  . Fungal dermatitis 07/20/2013  . Shoulder blade pain 07/20/2013  . CERVICAL CANCER 05/21/2010  . ASTHMA, UNSPECIFIED 05/21/2010  . DYSPNEA 05/21/2010  . Migraine 05/21/2010    Past Medical History  Diagnosis Date  . Depression   . STD (sexually transmitted disease)     HSV 2  . Migraine   . DVT (deep venous thrombosis)   . Hyperlipidemia 08/2014    Past Surgical History  Procedure Laterality Date  . Cervical biopsy  w/ loop electrode excision      33yrs ago  . Breast biopsy Left 2001    benign  . Dilation and curettage of uterus      age 78    Current Outpatient Prescriptions  Medication Sig Dispense Refill  . levonorgestrel (MIRENA) 20 MCG/24HR IUD 1 each by Intrauterine route  once.    Marland Kitchen atorvastatin (LIPITOR) 20 MG tablet Take 1 tablet (20 mg total) by mouth daily. 90 tablet 3  . buPROPion (WELLBUTRIN XL) 300 MG 24 hr tablet Take 1 tablet (300 mg total) by mouth daily. 90 tablet 3  . butalbital-aspirin-caffeine (FIORINAL) 50-325-40 MG per capsule Take 1 capsule by mouth 3 (three) times daily as needed for headache. 45 capsule 1  . phentermine 37.5 MG capsule Take 1 capsule (37.5 mg total) by mouth every morning. 30 capsule 2  . prochlorperazine (COMPAZINE) 10 MG tablet Take 1 tablet (10 mg total) by mouth every 6 (six) hours as needed (migraine). 30 tablet 1  . thyroid (ARMOUR) 90 MG tablet Take 1 tablet (90 mg total) by mouth daily. 90 tablet 1  . topiramate (TOPAMAX) 100 MG tablet 2 tabs PO daily (200mg ) 180 tablet 1  . valACYclovir (VALTREX) 500 MG tablet Take 1 tablet (500 mg total) by mouth daily. 1 tablet po QD. 30 tablet 12   No current facility-administered medications for this visit.     ALLERGIES: Review of patient's allergies indicates no known allergies.  Family History  Problem Relation Age of Onset  . Cancer Mother 81    bone ca  . Breast cancer Mother   . Heart failure Father 78  . Hypertension Father   .  Stroke Maternal Grandmother   . Diabetes Maternal Grandfather   . Diabetes Paternal Grandmother   . Cancer Sister 59    endometrial cancer    History   Social History  . Marital Status: Married    Spouse Name: N/A    Number of Children: N/A  . Years of Education: N/A   Occupational History  . Not on file.   Social History Main Topics  . Smoking status: Never Smoker   . Smokeless tobacco: Not on file  . Alcohol Use: 0.0 oz/week    0 Not specified per week     Comment: occ glass of wine (2-3 glasses of wine per month)  . Drug Use: No  . Sexual Activity: No     Comment: Mirena   Other Topics Concern  . Not on file   Social History Narrative    ROS:  Pertinent items are noted in HPI.  PHYSICAL EXAMINATION:    BP  116/80 mmHg  Pulse 72  Ht 5' 6.25" (1.683 m)  Wt 193 lb (87.544 kg)  BMI 30.91 kg/m2  LMP 08/15/2014     General appearance: alert, cooperative and appears stated age   Pelvic: External genitalia:  no lesions              Urethra:  normal appearing urethra with no masses, tenderness or lesions              Bartholins and Skenes: normal                 Vagina: normal appearing vagina with normal color and discharge, no lesions              Cervix: normal appearance.  Blood noted.  IUD strings noted.                    Bimanual Exam:  Uterus:  uterus is normal size, shape, consistency and nontender                                      Adnexa: normal adnexa in size, nontender and no masses                                                                         Anus:  normal sphincter tone,  Large hemorrhoids, one with excoriation.   Wet prep - pH 5.5, negative clue cells, trichomonas or yeast.  RBCs noted.   ASSESSMENT  Mirena IUD patient.  Possible bacterial vaginosis.    PLAN  Reassurance regarding bleeding profile.  IUD working for contraception.  Metrogel q hs for 5 nights.  Treatment for constipation per PCP.    Return prn.   An After Visit Summary was printed and given to the patient.  __15____ minutes face to face time of which over 50% was spent in counseling.

## 2014-10-03 ENCOUNTER — Telehealth: Payer: Self-pay | Admitting: Family Medicine

## 2014-10-03 MED ORDER — ONDANSETRON HCL 4 MG PO TABS: 4.0000 mg | ORAL_TABLET | Freq: Three times a day (TID) | ORAL | Status: DC | PRN

## 2014-10-03 NOTE — Telephone Encounter (Signed)
Pt can hold the lipitor until she is able to get in for an appt but her symptoms of HA, nausea, facial pain sound more consistent w/ sinus infection than a reaction to lipitor.  Please call and get more information so we can better serve her.

## 2014-10-03 NOTE — Telephone Encounter (Signed)
Pt notified and made aware.  No further needs at this time.

## 2014-10-03 NOTE — Telephone Encounter (Signed)
Spoke with patient who is not in any current distress.  Please see below. Advised to not take the Lipitor until we call her back.

## 2014-10-03 NOTE — Telephone Encounter (Signed)
[facility] Day - Client TELEPHONE ADVICE RECORD [facility] Patient Name: Gwendolyn Shields DOB: Aug 18, 1966 Initial Comment Caller states she is having reaction to new medication, lipitor. Having nausea, headache, facial pain. Nurse Assessment Nurse: Marcelline Deist, RN, Lynda Date/Time (Eastern Time): 10/03/2014 1:45:46 PM Confirm and document reason for call. If symptomatic, describe symptoms. ---Caller states she is having reaction to new medication, Lipitor since around December. Having nausea, headache, facial pain around cheekbones/eye sockets. Headaches are stubborn. Has the patient traveled out of the country within the last 30 days? ---Not Applicable Does the patient require triage? ---Yes Related visit to physician within the last 2 weeks? ---No Does the PT have any chronic conditions? (i.e. diabetes, asthma, etc.) ---Yes List chronic conditions. ---on Lipitor, thyroid, on Wellbutrin, migranes, off blood thinner rx for last few months Did the patient indicate they were pregnant? 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 & d/t forecasted weather tomorrow, she does not want to schedule an appt. at this time. If possible, would like her Dr. to contact her about the Lipitor & whether to continue taking it or something different. The nausea is the most concerning symptom that is interfering with her ability to do anything. States she uses the [facility] in [place].

## 2014-10-03 NOTE — Telephone Encounter (Signed)
Patient agreed to appt. Placed on schedule. Patient states that she is not convinced that it is sinus because it has been intermittent for the last several weeks. Advised that it is still possible that it is. States that she is having lots of nausea. She has not been able to sleep due to nausea. "I have felt the nausea all day until a little while ago". States that she has been taking pepto-bismal but it doesn't help.  Please advise.

## 2014-10-03 NOTE — Telephone Encounter (Signed)
C/o: Nausea, facial pain (around eye sockets, cheeks, top of head, and back behind ears where glasses would sit), and Headache on and off  x 3 weeks. Symptoms are intermittent.  Afebrile. She also has nasal congestion, but states that she typically doesn't have nasal congestion with sinus infections.  Rated facial pain/HA  5/10.  She has taken her migraine medications for headache with minimal relief.  Rated nausea 8/10; stating this was the worse of her symptoms.  She has tried crackers and pepto bismol with no relief.  Pt would like something for nausea until her appointment on Tuesday.    Please advise.

## 2014-10-03 NOTE — Telephone Encounter (Signed)
Ok for Zofran 4mg  TID prn, #30, no refills Hold Lipitor Pt's sxs still consistent w/ sinusitis and agree w/ appt

## 2014-10-08 ENCOUNTER — Encounter: Payer: Self-pay | Admitting: Family Medicine

## 2014-10-08 ENCOUNTER — Ambulatory Visit (INDEPENDENT_AMBULATORY_CARE_PROVIDER_SITE_OTHER): Payer: Self-pay | Admitting: Family Medicine

## 2014-10-08 VITALS — BP 122/78 | HR 83 | Temp 98.2°F | Resp 16 | Wt 195.4 lb

## 2014-10-08 DIAGNOSIS — T887XXA Unspecified adverse effect of drug or medicament, initial encounter: Secondary | ICD-10-CM

## 2014-10-08 DIAGNOSIS — J309 Allergic rhinitis, unspecified: Secondary | ICD-10-CM | POA: Insufficient documentation

## 2014-10-08 DIAGNOSIS — J3089 Other allergic rhinitis: Secondary | ICD-10-CM

## 2014-10-08 DIAGNOSIS — T50905A Adverse effect of unspecified drugs, medicaments and biological substances, initial encounter: Secondary | ICD-10-CM

## 2014-10-08 MED ORDER — PHENTERMINE HCL 37.5 MG PO CAPS: 37.5000 mg | ORAL_CAPSULE | ORAL | Status: DC

## 2014-10-08 NOTE — Assessment & Plan Note (Signed)
New.  Pt's nausea and HAs improved after stopping lipitor.  Stressed need for healthy diet and regular exercise.  Will hold on statin at this time.  Will follow.

## 2014-10-08 NOTE — Assessment & Plan Note (Signed)
New.  Suspect pt's facial pain/pressure and congestion is due to untreated nasal allergies and not at all related to pt's lipitor use.  Start OTC antihistamine.  Reviewed supportive care and red flags that should prompt return.  Pt expressed understanding and is in agreement w/ plan.

## 2014-10-08 NOTE — Progress Notes (Signed)
Pre visit review using our clinic review tool, if applicable. No additional management support is needed unless otherwise documented below in the visit note. 

## 2014-10-08 NOTE — Progress Notes (Signed)
   Subjective:    Patient ID: Gwendolyn Shields, female    DOB: 1965-12-11, 49 y.o.   MRN: [id number]  HPI HA/facial pain- 'i got really sick on Thursday'.  + nausea w/ HA.  sxs lasted for 6 hrs.  Pt reports waking w/ HAs and nausea 2-3x/week x3 weeks.  No fevers.  On Thursday was having facial pain- predominately around the eyes.  No ear pain.  No known sick contacts.  Pt feels that sxs were due to Lipitor.  Stopped medication last week and has not had nausea since.  AM HAs have improved but continues to have day time HAs that are responsive to meds (previous HAs were not responding to meds).  Facial pain has also improved.   Review of Systems For ROS see HPI     Objective:   Physical Exam  Constitutional: She appears well-developed and well-nourished. No distress.  HENT:  Head: Normocephalic and atraumatic.  Right Ear: Tympanic membrane normal.  Left Ear: Tympanic membrane normal.  Nose: Mucosal edema and rhinorrhea present. Right sinus exhibits no maxillary sinus tenderness and no frontal sinus tenderness. Left sinus exhibits no maxillary sinus tenderness and no frontal sinus tenderness.  Mouth/Throat: Mucous membranes are normal. Posterior oropharyngeal erythema (w/ PND) present.  Eyes: Conjunctivae and EOM are normal. Pupils are equal, round, and reactive to light.  Neck: Normal range of motion. Neck supple.  Cardiovascular: Normal rate, regular rhythm and normal heart sounds.   Pulmonary/Chest: Effort normal and breath sounds normal. No respiratory distress. She has no wheezes. She has no rales.  Abdominal: Soft. Bowel sounds are normal. She exhibits no distension. There is no tenderness. There is no rebound and no guarding.  Lymphadenopathy:    She has no cervical adenopathy.  Vitals reviewed.         Assessment & Plan:

## 2014-10-08 NOTE — Patient Instructions (Signed)
Schedule a fasting appt in 3-6 months to recheck weight loss progress and cholesterol STOP Lipitor Start Claritin or Zyrtec daily for seasonal allergy/sinus inflammation Drink plenty of fluids REST! Call with any questions or concerns Happy New Year!!!

## 2014-11-15 ENCOUNTER — Ambulatory Visit: Payer: 59 | Admitting: Family Medicine

## 2014-12-12 ENCOUNTER — Ambulatory Visit: Payer: 59 | Admitting: Family Medicine

## 2014-12-18 ENCOUNTER — Telehealth: Payer: Self-pay | Admitting: Family Medicine

## 2014-12-18 ENCOUNTER — Encounter: Payer: Self-pay | Admitting: Family Medicine

## 2014-12-18 NOTE — Telephone Encounter (Signed)
Yes.  Needs charge.

## 2014-12-18 NOTE — Telephone Encounter (Signed)
Pt was no show for follow up appt on 12/12/14- per appt notes PT was not feeling well. Letter sent- charge?

## 2014-12-25 ENCOUNTER — Ambulatory Visit: Payer: 59 | Admitting: Obstetrics and Gynecology

## 2015-01-21 ENCOUNTER — Encounter: Payer: Self-pay | Admitting: Family Medicine

## 2015-02-20 ENCOUNTER — Telehealth: Payer: Self-pay | Admitting: Family Medicine

## 2015-02-20 MED ORDER — TOPIRAMATE 100 MG PO TABS: ORAL_TABLET | ORAL | Status: DC

## 2015-02-20 MED ORDER — THYROID 90 MG PO TABS: 90.0000 mg | ORAL_TABLET | Freq: Every day | ORAL | Status: DC

## 2015-02-20 NOTE — Telephone Encounter (Signed)
Caller name:Ann Mobley Relationship to patient: Can be reached:[phone] Pharmacy:[facility] in [place]  Reason for call:pt will need to have meds refilled before appt in July. Requesting refill topamax and armour.

## 2015-02-20 NOTE — Telephone Encounter (Signed)
meds filled

## 2015-03-11 ENCOUNTER — Ambulatory Visit (INDEPENDENT_AMBULATORY_CARE_PROVIDER_SITE_OTHER): Payer: 59 | Admitting: Nurse Practitioner

## 2015-03-11 ENCOUNTER — Encounter: Payer: Self-pay | Admitting: Nurse Practitioner

## 2015-03-11 ENCOUNTER — Telehealth: Payer: Self-pay | Admitting: Nurse Practitioner

## 2015-03-11 VITALS — BP 122/80 | HR 60 | Resp 16 | Ht 66.5 in | Wt 178.0 lb

## 2015-03-11 DIAGNOSIS — Z0000 Encounter for general adult medical examination without abnormal findings: Secondary | ICD-10-CM | POA: Diagnosis not present

## 2015-03-11 DIAGNOSIS — M79661 Pain in right lower leg: Secondary | ICD-10-CM

## 2015-03-11 DIAGNOSIS — Z01419 Encounter for gynecological examination (general) (routine) without abnormal findings: Secondary | ICD-10-CM

## 2015-03-11 DIAGNOSIS — Z975 Presence of (intrauterine) contraceptive device: Secondary | ICD-10-CM | POA: Diagnosis not present

## 2015-03-11 NOTE — Patient Instructions (Signed)

## 2015-03-11 NOTE — Progress Notes (Signed)
Patient ID: Gwendolyn Shields, female   DOB: 10-01-1965, 49 y.o.   MRN: [id number] 48 y.o. [id number] Married  Caucasian Fe here for annual exam.  Menses now irregular since on Mirena IUD 07/2014 - usually spotting only about every 3-45 month. She had a DVT on OCP and was on anticoagulation.  She now believes the same leg feels very much like it did before with the DVT.  She has just returned from a 12 trip to [place] when then pain was back in the right leg.  No obvious swelling, warmth, or redness.  getting ready to go out of the country on 03/24/15.  Husband recently lost his job at [employer] and is seeking new employment.  She is now back to work.  Patient's last menstrual period was 02/10/2015 (approximate).          Sexually active: No.  The current method of family planning is abstinence/ IUD. Mirena IUD 07/19/2014 Exercising: No.  The patient does not participate in regular exercise at present. Smoker:  no  Health Maintenance: Pap:  12-14-12 MMG:  02-08-14 WNL will schedule Colonoscopy:  Never TDaP:  2014 Labs: PCP does labs    reports that she has never smoked. She has never used smokeless tobacco. She reports that she drinks alcohol. She reports that she does not use illicit drugs.  Past Medical History  Diagnosis Date  . Depression   . STD (sexually transmitted disease)     HSV 2  . Migraine   . DVT (deep venous thrombosis)   . Hyperlipidemia 08/2014    Past Surgical History  Procedure Laterality Date  . Cervical biopsy  w/ loop electrode excision      12yrs ago  . Breast biopsy Left 2001    benign  . Dilation and curettage of uterus      age 50  . Intrauterine device (iud) insertion  07/19/2014    Mirena    Current Outpatient Prescriptions  Medication Sig Dispense Refill  . buPROPion (WELLBUTRIN XL) 300 MG 24 hr tablet Take 1 tablet (300 mg total) by mouth daily. 90 tablet 3  . butalbital-aspirin-caffeine (FIORINAL) 50-325-40 MG per capsule Take 1 capsule by mouth 3  (three) times daily as needed for headache. 45 capsule 1  . levonorgestrel (MIRENA) 20 MCG/24HR IUD 1 each by Intrauterine route once.    . thyroid (ARMOUR) 90 MG tablet Take 1 tablet (90 mg total) by mouth daily. 90 tablet 1  . topiramate (TOPAMAX) 100 MG tablet 2 tabs PO daily (200mg ) 180 tablet 1  . valACYclovir (VALTREX) 500 MG tablet Take 1 tablet (500 mg total) by mouth daily. 1 tablet po QD. 30 tablet 12   No current facility-administered medications for this visit.    Family History  Problem Relation Age of Onset  . Breast cancer Mother 68    recurence of breast cancer early 20's then developed bone cancer ate 60   . Breast cancer Mother 40    then developed bone cancer from recurence age 71  . Heart failure Father 29  . Hypertension Father   . Stroke Maternal Grandmother   . Diabetes Maternal Grandfather   . Diabetes Paternal Grandmother   . Cancer Sister 48    endometrial cancer    ROS:  Pertinent items are noted in HPI.  Otherwise, a comprehensive ROS was negative.  Exam:   BP 122/80 mmHg  Pulse 60  Resp 16  Ht 5' 6.5" (1.689 m)  Wt 178 lb (80.74  kg)  BMI 28.30 kg/m2  LMP 02/10/2015 (Approximate) Height: 5' 6.5" (168.9 cm) Ht Readings from Last 3 Encounters:  03/11/15 5' 6.5" (1.689 m)  08/21/14 5' 6.25" (1.683 m)  08/13/14 5' 6.25" (1.683 m)    General appearance: alert, cooperative and appears stated age Head: Normocephalic, without obvious abnormality, atraumatic Neck: no adenopathy, supple, symmetrical, trachea midline and thyroid normal to inspection and palpation Lungs: clear to auscultation bilaterally Breasts: normal appearance, no masses or tenderness Heart: regular rate and rhythm Abdomen: soft, non-tender; no masses,  no organomegaly Extremities: extremities normal, atraumatic, no cyanosis or edema; right calf measurement is 16", left calf measurement is 15.25" Homans is negative Skin: Skin color, texture, turgor normal. No rashes or  lesions Lymph nodes: Cervical, supraclavicular, and axillary nodes normal. No abnormal inguinal nodes palpated Neurologic: Grossly normal   Pelvic: External genitalia:  no lesions              Urethra:  normal appearing urethra with no masses, tenderness or lesions              Bartholin's and Skene's: normal                 Vagina: normal appearing vagina with normal color and discharge, no lesions              Cervix: anteverted,  IUD strings is visible              Pap taken: Yes.   Bimanual Exam:  Uterus:  normal size, contour, position, consistency, mobility, non-tender              Adnexa: no mass, fullness, tenderness               Rectovaginal: Confirms               Anus:  normal sphincter tone, no lesions  Chaperone present: Yes  A:  Well Woman with normal exam  History of DVT on OCP 11/2013  Recent pain right leg again after a long trip - R/O another DVT  Mirena IUD for birth control 07/19/2014  Hypothyroid - PCP manages  History of migraine HA's  Situational anxiety and depression - PCP manages   P:   Reviewed health and wellness pertinent to exam  Pap smear as above  Mammogram is due and will schedule - now that insurance is back in place  Will get doppler studies on right leg and follow.  Pt is aware that we are going to try and make apt. ASAP - she has work commitments and may not be abe to go.  She is aware of risk for PE, CVA, etc.  She is also aware that when traveling she needs extra precautions and doing ankle pumps and TED stockings.  Does not need refill on Valtrex right now but believes she gets this from PCP.  Counseled on breast self exam, mammography screening, adequate intake of calcium and vitamin D, diet and exercise return annually or prn  An After Visit Summary was printed and given to the patient.

## 2015-03-11 NOTE — Telephone Encounter (Signed)
Pt was called since we now are aware that she declined going to get Doppler study today because of work commitments.  She is advised that she should go to ER tonight for doppler study and follow up.  She is now considering this even though her work was allowing her to take time off for this tomorrow.  She is given potential risk of worsening symptoms, i.e.: PE, CVA, etc.  She is discussing this with her husband as we are talking and may go on to hospital.   If her doppler is negative - she is still advised to follow with PCP next week and should do everything to avoid a recurrent DVT while traveling.

## 2015-03-11 NOTE — Progress Notes (Signed)
Encounter reviewed by Dr. Reghan Thul Amundson C. Silva.  

## 2015-03-11 NOTE — Progress Notes (Signed)
Patient scheduled while in office for doppler of right lower extremity due to pain and prior history of DVT. Patient declines appointment for today. Appointment scheduled for 03/12/2015 at 12:40 pm with [facility]. Patient is agreeable to date and time. Placed in imaging hold.

## 2015-03-12 ENCOUNTER — Telehealth: Payer: Self-pay | Admitting: Nurse Practitioner

## 2015-03-12 ENCOUNTER — Ambulatory Visit
Admission: RE | Admit: 2015-03-12 | Discharge: 2015-03-12 | Disposition: A | Discharge status: Home / Self Care | Payer: 59 | Source: Ambulatory Visit | Attending: Nurse Practitioner | Admitting: Nurse Practitioner

## 2015-03-12 DIAGNOSIS — M79661 Pain in right lower leg: Secondary | ICD-10-CM

## 2015-03-12 NOTE — Telephone Encounter (Signed)
Patient calling for results from an ultrasound of her leg done today.

## 2015-03-12 NOTE — Telephone Encounter (Signed)
Call to patient's home number [phone] per request. Husband answered as patient is not home from work yet. Husband, Purcell Nails, is on her ROI. Advised him that ultrasound results of patient's right leg has returned and there is no sign of a DVT. Advised patient will need to continue to monitor leg and if symptoms persist will need to be seen for further evaluation with PCP. Husband is agreeable and will let patient know. Advised if patient has any further questions may return call to office. Advised I will also return call once Dr.Silva has review results if any further recommendations are needed. Husband is agreeable.

## 2015-03-12 NOTE — Telephone Encounter (Signed)
Notes Recorded by Regina Eck, CNM on 03/12/2015 at 5:18 PM Notify patient that doppler study shows no indication of acute or chronic DVT in right lower leg. If pain continues needs to be seen by PCP. I will let Patty know so if any other instructions can be given.

## 2015-03-12 NOTE — Telephone Encounter (Signed)
I would encourage using compression hose to help give her leg comfort. I agree with follow up with PCP if symptoms persist or worsen.   Thank you.

## 2015-03-12 NOTE — Telephone Encounter (Signed)
Left message to call [place] at [phone].   Results of right lower extremity seen below. Results to Dr.Silva for review as well.  IMPRESSION: No evidence of acute or chronic DVT within the right lower extremity.   Electronically Signed  By: Sandi Mariscal M.D.  On: 03/12/2015 14:18   Signed By: Sandi Mariscal, MD on 03/12/2015 2:18 PM

## 2015-03-13 NOTE — Telephone Encounter (Signed)
Left message to call Jenaro Souder at [phone]. 

## 2015-03-13 NOTE — Telephone Encounter (Signed)
Returning a call to [name]. °

## 2015-03-13 NOTE — Telephone Encounter (Signed)
Spoke with patient. Advised patient of all results and message as seen below from [place]. Patient is agreeable and verbalizes understanding. Patient has appointment with PCP scheduled for next week.  Routing to provider for final review. Patient agreeable to disposition. Will close encounter.

## 2015-03-14 LAB — IPS PAP TEST WITH HPV

## 2015-03-21 ENCOUNTER — Ambulatory Visit (INDEPENDENT_AMBULATORY_CARE_PROVIDER_SITE_OTHER): Payer: 59 | Admitting: Family Medicine

## 2015-03-21 ENCOUNTER — Other Ambulatory Visit: Payer: Self-pay

## 2015-03-21 ENCOUNTER — Encounter: Payer: Self-pay | Admitting: Family Medicine

## 2015-03-21 VITALS — BP 120/84 | HR 70 | Temp 98.0°F | Resp 16 | Ht 67.0 in | Wt 198.1 lb

## 2015-03-21 DIAGNOSIS — F329 Major depressive disorder, single episode, unspecified: Secondary | ICD-10-CM

## 2015-03-21 DIAGNOSIS — E038 Other specified hypothyroidism: Secondary | ICD-10-CM | POA: Diagnosis not present

## 2015-03-21 DIAGNOSIS — F32A Depression, unspecified: Secondary | ICD-10-CM

## 2015-03-21 DIAGNOSIS — G43009 Migraine without aura, not intractable, without status migrainosus: Secondary | ICD-10-CM

## 2015-03-21 DIAGNOSIS — E785 Hyperlipidemia, unspecified: Secondary | ICD-10-CM | POA: Diagnosis not present

## 2015-03-21 DIAGNOSIS — Z1231 Encounter for screening mammogram for malignant neoplasm of breast: Secondary | ICD-10-CM

## 2015-03-21 MED ORDER — TOPIRAMATE 100 MG PO TABS: ORAL_TABLET | ORAL | Status: DC

## 2015-03-21 MED ORDER — FLUOXETINE HCL 20 MG PO TABS: 20.0000 mg | ORAL_TABLET | Freq: Every day | ORAL | Status: DC

## 2015-03-21 MED ORDER — BUPROPION HCL ER (XL) 300 MG PO TB24: 300.0000 mg | ORAL_TABLET | Freq: Every day | ORAL | Status: DC

## 2015-03-21 MED ORDER — ZOLMITRIPTAN 5 MG NA SOLN: 1.0000 | NASAL | Status: DC | PRN

## 2015-03-21 NOTE — Assessment & Plan Note (Signed)
Chronic problem.  Pt is interested in switching back to levothyroxine from Armour due to cost.  Will get labs to determine appropriate dose.  Will send in new script once labs available.

## 2015-03-21 NOTE — Assessment & Plan Note (Signed)
Deteriorated.  Pt is crying daily and really struggling with increased stress.  Due to this, will add low dose Prozac in addition to her current Wellbutrin.  Discussed stress management and need for her to find an outlet.  Will follow closely.

## 2015-03-21 NOTE — Assessment & Plan Note (Signed)
Chronic problem.  Noted on last labs.  Pt intolerant to Lipitor.  Not working on healthy diet, not getting regular exercise.  Check labs.  Start meds as needed.

## 2015-03-21 NOTE — Progress Notes (Signed)
   Subjective:    Patient ID: Gwendolyn Shields, female    DOB: 1965-11-28, 49 y.o.   MRN: [id number]  HPI Elevated cholesterol- was elevated on last labs.  Pt has not been working on [employer] or regular exercise.  Denies CP, SOB.  Ongoing HAs.  Depression- pt has returned to work for the 1st time in 72 yrs after husband lost job this spring.  Currently on Wellbutrin 300mg  daily but 'i cry almost every day'.  Hypothyroid- chronic problem, currently on Armour.  Due to insurance reasons, needs to switch back to Levothyroxine.  Ongoing fatigue.  Due for labs.  Migraines- pt is on Topamax.  Will use daughter's Zomig when HAs are severe.   Review of Systems For ROS see HPI     Objective:   Physical Exam  Constitutional: She is oriented to person, place, and time. She appears well-developed and well-nourished. No distress.  HENT:  Head: Normocephalic and atraumatic.  Eyes: Conjunctivae and EOM are normal. Pupils are equal, round, and reactive to light.  Neck: Normal range of motion. Neck supple. No thyromegaly present.  Cardiovascular: Normal rate, regular rhythm, normal heart sounds and intact distal pulses.   No murmur heard. Pulmonary/Chest: Effort normal and breath sounds normal. No respiratory distress.  Abdominal: Soft. She exhibits no distension. There is no tenderness.  Musculoskeletal: She exhibits no edema.  Lymphadenopathy:    She has no cervical adenopathy.  Neurological: She is alert and oriented to person, place, and time.  Skin: Skin is warm and dry.  Psychiatric: Her behavior is normal.  tearful  Vitals reviewed.         Assessment & Plan:

## 2015-03-21 NOTE — Patient Instructions (Signed)
Follow up in 1 month to recheck mood We'll notify you of your lab results and make any changes if needed Continue the Wellbutrin Add the Prozac daily We'll adjust your thyroid dose once the results are available Use the Zomig as needed for severe migraines Try and make healthy food choices and get regular exercise- it's a great stress outlet! Call with any questions or concerns Hang in there! HAVE A GREAT TRIP!!!

## 2015-03-21 NOTE — Assessment & Plan Note (Signed)
Deteriorated w/ recent stress and worsening depression.  Pt has been using daughter's Zomig nasal spray.  Sent in prescription for pt to use w/o using her daughter's medication.  Pt expressed understanding and is in agreement w/ plan.

## 2015-03-21 NOTE — Progress Notes (Signed)
Pre visit review using our clinic review tool, if applicable. No additional management support is needed unless otherwise documented below in the visit note. 

## 2015-03-22 LAB — BASIC METABOLIC PANEL
BUN: 17 mg/dL (ref 6–23)
CO2: 23 mEq/L (ref 19–32)
CREATININE: 0.87 mg/dL (ref 0.50–1.10)
Calcium: 9.2 mg/dL (ref 8.4–10.5)
Chloride: 108 mEq/L (ref 96–112)
Glucose, Bld: 79 mg/dL (ref 70–99)
POTASSIUM: 4.4 meq/L (ref 3.5–5.3)
Sodium: 143 mEq/L (ref 135–145)

## 2015-03-22 LAB — HEPATIC FUNCTION PANEL
ALT: 13 U/L (ref 0–35)
AST: 17 U/L (ref 0–37)
Albumin: 4.1 g/dL (ref 3.5–5.2)
Alkaline Phosphatase: 74 U/L (ref 39–117)
BILIRUBIN TOTAL: 0.3 mg/dL (ref 0.2–1.2)
Total Protein: 6.6 g/dL (ref 6.0–8.3)

## 2015-03-22 LAB — LIPID PANEL
Cholesterol: 208 mg/dL — ABNORMAL HIGH (ref 0–200)
HDL: 62 mg/dL (ref >=46)
LDL Cholesterol: 117 mg/dL — ABNORMAL HIGH (ref 0–99)
TRIGLYCERIDES: 145 mg/dL (ref <150)
Total CHOL/HDL Ratio: 3.4 Ratio
VLDL: 29 mg/dL (ref 0–40)

## 2015-03-22 LAB — TSH: TSH: 0.153 u[IU]/mL — ABNORMAL LOW (ref 0.350–4.500)

## 2015-03-24 ENCOUNTER — Other Ambulatory Visit: Payer: Self-pay | Admitting: General Practice

## 2015-03-24 MED ORDER — LEVOTHYROXINE SODIUM 112 MCG PO TABS: 112.0000 ug | ORAL_TABLET | Freq: Every day | ORAL | Status: DC

## 2015-04-07 ENCOUNTER — Telehealth: Payer: Self-pay | Admitting: Emergency Medicine

## 2015-04-07 NOTE — Telephone Encounter (Signed)
Out of imaging hold.

## 2015-04-07 NOTE — Telephone Encounter (Signed)
-----   Message from Nunzio Cobbs, MD sent at 04/06/2015 10:55 PM EDT ----- Regarding: RE: Imaging hold-DVT rule out  Ok to remove from imaging hold.   Thanks,   Brook ----- Message -----    From: Michele Mcalpine, RN    Sent: 04/04/2015   2:34 PM      To: Brook Oletta Lamas, MD Subject: Imaging hold-DVT rule out                      Patient is imaging hold for evaluation for DVT. Imaging completed and patient aware of results. Okay to remove from hold?

## 2015-04-17 ENCOUNTER — Telehealth: Payer: Self-pay | Admitting: Family Medicine

## 2015-04-17 MED ORDER — BUTALBITAL-ASA-CAFFEINE 50-325-40 MG PO CAPS: 1.0000 | ORAL_CAPSULE | Freq: Three times a day (TID) | ORAL | Status: DC | PRN

## 2015-04-17 NOTE — Telephone Encounter (Signed)
Medication filled to pharmacy as requested.   

## 2015-04-17 NOTE — Telephone Encounter (Signed)
Caller name: Nashalie Sallis Relationship to patient: Self  Can be reached: [phone] Pharmacy:  Reason for call: pt need a refill on her butalbital Rx.   She will be going out of town and needs it immediately.

## 2015-04-17 NOTE — Telephone Encounter (Signed)
[facility] 03/21/15 Butalbital last filled 08/13/14 #45 with 1

## 2015-04-17 NOTE — Telephone Encounter (Signed)
[facility] for #45, 1 refill

## 2015-04-21 ENCOUNTER — Ambulatory Visit: Payer: 59 | Admitting: Family Medicine

## 2015-04-21 ENCOUNTER — Ambulatory Visit: Payer: 59

## 2015-04-28 ENCOUNTER — Ambulatory Visit
Admission: RE | Admit: 2015-04-28 | Discharge: 2015-04-28 | Disposition: A | Discharge status: Home / Self Care | Payer: 59 | Source: Ambulatory Visit

## 2015-04-28 DIAGNOSIS — Z1231 Encounter for screening mammogram for malignant neoplasm of breast: Secondary | ICD-10-CM

## 2015-05-07 ENCOUNTER — Telehealth: Payer: Self-pay | Admitting: Family Medicine

## 2015-05-07 MED ORDER — TOPIRAMATE 100 MG PO TABS: ORAL_TABLET | ORAL | Status: DC

## 2015-05-07 NOTE — Telephone Encounter (Signed)
Medication filled to pharmacy as requested.   

## 2015-05-07 NOTE — Telephone Encounter (Signed)
Caller name: Relationship to patient: Can be reached: Pharmacy: [facility] in [place]  Reason for call: Please send rx for topiramate (TOPAMAX) 100 MG tablet for 90 day supply with refills please.  Pt also asked for refills on prozac and levothyroxine but I advised she should have 1 month refill/med left on both.

## 2015-06-19 ENCOUNTER — Telehealth: Payer: Self-pay | Admitting: Obstetrics and Gynecology

## 2015-06-19 DIAGNOSIS — Z30432 Encounter for removal of intrauterine contraceptive device: Secondary | ICD-10-CM

## 2015-06-19 NOTE — Telephone Encounter (Signed)
Ok to remove IUD if patient desires.

## 2015-06-19 NOTE — Telephone Encounter (Signed)
Spoke with patient. She is calling to request that her IUD be removed. She states she is not using it as she intended and feels that it is causing her abdominal bloating and breast tenderness. She wishes to have removed.  Scheduled for IUD removal 06/27/15 at 1500 with Dr. Quincy Simmonds. IUD placed by Dr. Quincy Simmonds 07/19/14.  Advised can take motrin 800 mg one hour before appointment with food.  Order placed for pre-certification.   cc Kerry Hough for insurance pre-certification and patient contact. . Dr. Quincy Simmonds, okay for removal if patient desires?

## 2015-06-19 NOTE — Telephone Encounter (Signed)
Patient is asking for an appointment to have her IUD removed. Last seen 03/11/15.

## 2015-06-23 ENCOUNTER — Telehealth: Payer: Self-pay | Admitting: Family Medicine

## 2015-06-23 MED ORDER — LEVOTHYROXINE SODIUM 112 MCG PO TABS: 112.0000 ug | ORAL_TABLET | Freq: Every day | ORAL | Status: DC

## 2015-06-23 NOTE — Telephone Encounter (Signed)
Medication Detail      Disp Refills Start End     levothyroxine (SYNTHROID, LEVOTHROID) 112 MCG tablet 30 tablet 2 03/24/2015     Sig - Route: Take 1 tablet (112 mcg total) by mouth daily. - Oral    E-Prescribing Status: Receipt confirmed by pharmacy (03/24/2015 12:10 PM EDT)     Pharmacy    [id number] - [place], [place] - [address]   Refill sent per [facility] refill protocol/SLS

## 2015-06-23 NOTE — Telephone Encounter (Signed)
Caller name:Jadira Relationship to patient:SELF Can be reached:[phone] [place]  Reason for call:levothyroxine (SYNTHROID, LEVOTHROID) 112 MCG tablet [[id number]]

## 2015-06-24 ENCOUNTER — Telehealth: Payer: Self-pay | Admitting: Obstetrics and Gynecology

## 2015-06-24 NOTE — Telephone Encounter (Signed)
Return call to Sally. °

## 2015-06-24 NOTE — Telephone Encounter (Signed)
Return call to patient. Left message to call back. Left message that appointments available on Monday 06-30-15.

## 2015-06-24 NOTE — Telephone Encounter (Signed)
Return call to patient. Patient on limited time schedule on Friday, interested to know what other options available. For patient convenience, appointment rescheduled to Monday 06-30-15 at 1100.  Routing to provider for final review. Patient agreeable to disposition. Will close encounter.

## 2015-06-24 NOTE — Telephone Encounter (Signed)
Patient may need to reschedule her procedure appointment on Friday but would like to check to see how soon she can get back in with Dr Quincy Simmonds.

## 2015-06-27 ENCOUNTER — Ambulatory Visit: Payer: 59 | Admitting: Obstetrics and Gynecology

## 2015-06-30 ENCOUNTER — Ambulatory Visit (INDEPENDENT_AMBULATORY_CARE_PROVIDER_SITE_OTHER): Payer: 59 | Admitting: Obstetrics and Gynecology

## 2015-06-30 ENCOUNTER — Encounter: Payer: Self-pay | Admitting: Obstetrics and Gynecology

## 2015-06-30 VITALS — BP 110/72 | HR 68 | Ht 66.5 in | Wt 198.2 lb

## 2015-06-30 DIAGNOSIS — Z30432 Encounter for removal of intrauterine contraceptive device: Secondary | ICD-10-CM | POA: Diagnosis not present

## 2015-06-30 DIAGNOSIS — Z3009 Encounter for other general counseling and advice on contraception: Secondary | ICD-10-CM

## 2015-06-30 MED ORDER — NORETHINDRONE 0.35 MG PO TABS: 1.0000 | ORAL_TABLET | Freq: Every day | ORAL | Status: DC

## 2015-06-30 NOTE — Progress Notes (Signed)
Patient ID: Gwendolyn Shields, female   DOB: Jan 16, 1966, 49 y.o.   MRN: [id number] GYNECOLOGY  VISIT   HPI: 49 y.o.   Married  Caucasian  female   [phone] with Patient's last menstrual period was 06/19/2015 (exact date).   here for Mirena IUD removal.   Wants IUD removed.  Feels bloated.  Bled like a heavy period this last week for the first time.  No pain.   Wants to go back on minipill.  Felt better on the progesterone only pill.  GYNECOLOGIC HISTORY: Patient's last menstrual period was 06/19/2015 (exact date). Contraception:Mirena IUD Menopausal hormone therapy: n/a Last mammogram: 04-29-15 Density Cat.A/Neg/BiRads1:[facility]. Last pap smear: 03-11-15 Neg:Neg HR HPV        OB History    Gravida Para Term Preterm AB TAB SAB Ectopic Multiple Living   4 2 2  2  2   2          Patient Active Problem List   Diagnosis Date Noted  . Hyperlipidemia 03/21/2015  . Allergic rhinitis 10/08/2014  . Medication side effect 10/08/2014  . Preventative health care 08/13/2014  . Hypothyroidism 04/19/2014  . Weight gain 01/17/2014  . Other malaise and fatigue 01/17/2014  . Depression 01/17/2014  . DVT (deep venous thrombosis) ([place]) 11/29/2013  . 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 Pain of right lower leg 11/28/2013  . Routine general medical examination at a health care facility 07/20/2013  . Fungal dermatitis 07/20/2013  . Shoulder blade pain 07/20/2013  . CERVICAL CANCER 05/21/2010  . ASTHMA, UNSPECIFIED 05/21/2010  . DYSPNEA 05/21/2010  . Migraine 05/21/2010    Past Medical History  Diagnosis Date  . Depression   . STD (sexually transmitted disease)     HSV 2  . Migraine   . DVT (deep venous thrombosis) ([place])   . Hyperlipidemia 08/2014    Past Surgical History  Procedure Laterality Date  . Cervical biopsy  w/ loop electrode excision      93yrs ago  . Breast biopsy Left 2001    benign  . Dilation and curettage of uterus      age 3  . Intrauterine  device (iud) insertion  07/19/2014    Mirena    Current Outpatient Prescriptions  Medication Sig Dispense Refill  . buPROPion (WELLBUTRIN XL) 300 MG 24 hr tablet Take 1 tablet (300 mg total) by mouth daily. 90 tablet 0  . butalbital-aspirin-caffeine (FIORINAL) 50-325-40 MG per capsule Take 1 capsule by mouth 3 (three) times daily as needed for headache. 45 capsule 1  . FLUoxetine (PROZAC) 20 MG tablet Take 1 tablet (20 mg total) by mouth daily. 90 tablet 0  . levonorgestrel (MIRENA) 20 MCG/24HR IUD 1 each by Intrauterine route once.    [place] Kitchen levothyroxine (SYNTHROID, LEVOTHROID) 112 MCG tablet Take 1 tablet (112 mcg total) by mouth daily. 30 tablet 2  . topiramate (TOPAMAX) 100 MG tablet 2 tabs PO daily (200mg ) 60 tablet 1  . zolmitriptan (ZOMIG) 5 MG nasal solution Place 1 spray into the nose as needed for migraine. 6 Units 1  . mometasone (ELOCON) 0.1 % cream Apply 1 application topically as needed.  0   No current facility-administered medications for this visit.     ALLERGIES: Lipitor  Family History  Problem Relation Age of Onset  . Breast cancer Mother 45    recurence of breast cancer early 31's then developed bone cancer ate 24   . Breast cancer Mother 57  then developed bone cancer from recurence age 22  . Heart failure Father 11  . Hypertension Father   . Stroke Maternal Grandmother   . Diabetes Maternal Grandfather   . Diabetes Paternal Grandmother   . Cancer Sister 20    endometrial cancer    Social History   Social History  . Marital Status: Married    Spouse Name: N/A  . Number of Children: N/A  . Years of Education: N/A   Occupational History  . Not on file.   Social History Main Topics  . Smoking status: Never Smoker   . Smokeless tobacco: Never Used  . Alcohol Use: 0.0 oz/week    0 Standard drinks or equivalent per week     Comment: occ glass of wine (2-3 glasses of wine per month)  . Drug Use: No  . Sexual Activity: No     Comment: Mirena   Other  Topics Concern  . Not on file   Social History Narrative    ROS:  Pertinent items are noted in HPI.  PHYSICAL EXAMINATION:    BP 110/72 mmHg  Pulse 68  Ht 5' 6.5" (1.689 m)  Wt 198 lb 3.2 oz (89.903 kg)  BMI 31.51 kg/m2  LMP 06/19/2015 (Exact Date)    General appearance: alert, cooperative and appears stated age   Pelvic: External genitalia:  no lesions              Urethra:  normal appearing urethra with no masses, tenderness or lesions              Bartholins and Skenes: normal                 Vagina: normal appearing vagina with normal color and discharge, no lesions              Cervix: IUD strings noted.  IUD removed after verbal permission.  Ring forceps used.  IUD intact and discarded.              Bimanual Exam:  Uterus:  normal size, contour, position, consistency, mobility, non-tender              Adnexa: normal adnexa and no mass, fullness, tenderness              Rectovaginal: hemorrhoids.  Chaperone was present for exam.  ASSESSMENT  Mirena IUD - intolerant of bloating symptoms.   Need for contraception.  Hx DVT.   PLAN  Counseled regarding Mirena and Ortho Micronor and differences between the two prior to her IUD being removed.  Rx to patient for Micronor until annual exam is due.  Follow up for annual exam and prn.   An After Visit Summary was printed and given to the patient.  ___15___ minutes face to face time of which over 50% was spent in counseling.

## 2015-07-11 ENCOUNTER — Telehealth: Payer: Self-pay | Admitting: Obstetrics and Gynecology

## 2015-07-11 MED ORDER — NORETHINDRONE ACETATE 5 MG PO TABS: 5.0000 mg | ORAL_TABLET | Freq: Every day | ORAL | Status: DC

## 2015-07-11 NOTE — Telephone Encounter (Signed)
Patient called requesting to speak to the nurse and said, "I had my IUD removed last week and I have been bleeding ever since." No paper chart.

## 2015-07-11 NOTE — Telephone Encounter (Signed)
I would recommend stopping the Micronor.  I would like to prescribe Aygestin 5 mg daily for 10 days.  This is a stronger dose of the same norethindrone, just enough to help control bleeding.  This should stop her bleeding and then give her an organized cycle.  After that withdrawal bleed ends, she can start the Micronor again.  Needs to be using condoms for pregnancy prevention until on the Micronor for one month.

## 2015-07-11 NOTE — Telephone Encounter (Signed)
Spoke with patient. Patient had her IUD removed on 06/30/2015. Did not have any bleeding the first day after the removal. On Wednesday 07/02/2015 began to have increased bleeding with clotting. Began taking her Micronor again on 07/02/2015. Started with a new pack. Is taking at the same time daily without missing any pills. States she is still bleeding. Has changed her pad/tampon twice today. "My bleeding is not slowing down or stopping." Denies any weakness, dizziness, or shortness of breath. Patient is asking what she can do to stop her bleeding. Advised I will speak with Dr.Silva and return call with further recommendations. Patient is agreeable.

## 2015-07-11 NOTE — Telephone Encounter (Signed)
Spoke with patient. Advised of message as seen below from [place]. Patient is agreeable and verbalizes understanding. Rx for Aygestin 5 mg po #10 0RF sent to pharmacy on file. Patient verbalizes understanding of instructions and is able to repeat these back to me. Is aware to call with any questions or concerns.  Routing to provider for final review. Patient agreeable to disposition. Will close encounter.

## 2015-07-21 ENCOUNTER — Telehealth: Payer: Self-pay | Admitting: Obstetrics and Gynecology

## 2015-07-21 NOTE — Telephone Encounter (Signed)
I agree with your recommendations.  You may close the encounter.  

## 2015-07-21 NOTE — Telephone Encounter (Signed)
Patient has taken provera which had stopped her bleeding and recently it started again. She was told to give our office a call if she had any problems and today is her 10th day of bleeding. Was heavy for a moment yesterday but has lightened up now. Best # to reach patient: [phone] (No Chart)

## 2015-07-21 NOTE — Telephone Encounter (Signed)
Spoke with patient. Patient states that she has been taking Aygestin 5 mg and has been doing well. Placed on Aygestin on 07/11/2015 due to increased, irregular bleeding after IUD removal. Please see telephone note below. No bleeding until yesterday 11/6 which was her 9th day of taking the Aygestin. States that she had light bright red spotting once. No further bleeding. Took her 10th pill today. No bleeding. Advised this is not uncommon and may continue to monitor her bleeding. Advised this will likely generate a withdrawal bleed which will help to regulate her cycle. Advised after bleeding has stopped may restart her Micronor again. Advised if bleeding is heavy and having to change her pad/tampon every hour due to soaking through needs to be seen immediately as this is a bleeding emergency. Advised if bleeding is prolonged will need to also contact the office. Patient is agreeable and verbalizes understanding.    Nunzio Cobbs, MD at 07/11/2015 4:42 PM     Status: Signed       Expand All Collapse All   I would recommend stopping the Micronor.  I would like to prescribe Aygestin 5 mg daily for 10 days. This is a stronger dose of the same norethindrone, just enough to help control bleeding.  This should stop her bleeding and then give her an organized cycle.  After that withdrawal bleed ends, she can start the Micronor again.  Needs to be using condoms for pregnancy prevention until on the Micronor for one month.          Routing to [facility] for final review before closing.

## 2015-07-23 ENCOUNTER — Other Ambulatory Visit: Payer: Self-pay | Admitting: Family Medicine

## 2015-07-24 NOTE — Telephone Encounter (Signed)
Medication filled to pharmacy as requested.   

## 2015-09-05 ENCOUNTER — Telehealth: Payer: Self-pay | Admitting: Family Medicine

## 2015-09-05 MED ORDER — BUPROPION HCL ER (XL) 300 MG PO TB24: 300.0000 mg | ORAL_TABLET | Freq: Every day | ORAL | Status: DC

## 2015-09-05 NOTE — Telephone Encounter (Signed)
Pharmacy: [facility] in [place]  Reason for call: Pt has new insurance requiring use of pharmacy above. Pt needing Wellbutrin sent in 90 day supply. Pt has 5 days left. Pt is going out of town Tuesday and would like to pick up in the next day or two.

## 2015-09-05 NOTE — Telephone Encounter (Signed)
Medication filled to pharmacy as requested.   

## 2015-09-17 ENCOUNTER — Other Ambulatory Visit: Payer: Self-pay | Admitting: Family Medicine

## 2015-09-29 ENCOUNTER — Telehealth: Payer: Self-pay | Admitting: Family Medicine

## 2015-09-29 ENCOUNTER — Ambulatory Visit (INDEPENDENT_AMBULATORY_CARE_PROVIDER_SITE_OTHER): Payer: BLUE CROSS/BLUE SHIELD | Admitting: Family

## 2015-09-29 ENCOUNTER — Encounter: Payer: Self-pay | Admitting: Family

## 2015-09-29 VITALS — BP 123/52 | HR 67 | Temp 98.0°F | Resp 16 | Ht 67.0 in | Wt 205.2 lb

## 2015-09-29 DIAGNOSIS — F329 Major depressive disorder, single episode, unspecified: Secondary | ICD-10-CM

## 2015-09-29 DIAGNOSIS — R202 Paresthesia of skin: Secondary | ICD-10-CM | POA: Diagnosis not present

## 2015-09-29 DIAGNOSIS — R002 Palpitations: Secondary | ICD-10-CM | POA: Diagnosis not present

## 2015-09-29 DIAGNOSIS — E038 Other specified hypothyroidism: Secondary | ICD-10-CM

## 2015-09-29 DIAGNOSIS — F32A Depression, unspecified: Secondary | ICD-10-CM

## 2015-09-29 LAB — HEPATIC FUNCTION PANEL
ALBUMIN: 4.2 g/dL (ref 3.5–5.2)
ALT: 14 U/L (ref 0–35)
AST: 19 U/L (ref 0–37)
Alkaline Phosphatase: 73 U/L (ref 39–117)
Bilirubin, Direct: 0.1 mg/dL (ref 0.0–0.3)
TOTAL PROTEIN: 7.1 g/dL (ref 6.0–8.3)
Total Bilirubin: 0.3 mg/dL (ref 0.2–1.2)

## 2015-09-29 LAB — BASIC METABOLIC PANEL
BUN: 14 mg/dL (ref 6–23)
CHLORIDE: 109 meq/L (ref 96–112)
CO2: 23 mEq/L (ref 19–32)
Calcium: 9.1 mg/dL (ref 8.4–10.5)
Creatinine, Ser: 1.06 mg/dL (ref 0.40–1.20)
GFR: 58.27 mL/min — AB (ref >60.00)
GLUCOSE: 93 mg/dL (ref 70–99)
POTASSIUM: 4.1 meq/L (ref 3.5–5.1)
SODIUM: 139 meq/L (ref 135–145)

## 2015-09-29 LAB — CBC WITH DIFFERENTIAL/PLATELET
Basophils Absolute: 0 10*3/uL (ref 0.0–0.1)
Basophils Relative: 0.3 % (ref 0.0–3.0)
EOS PCT: 1.7 % (ref 0.0–5.0)
Eosinophils Absolute: 0.1 10*3/uL (ref 0.0–0.7)
HCT: 42.2 % (ref 36.0–46.0)
HEMOGLOBIN: 13.8 g/dL (ref 12.0–15.0)
Lymphocytes Relative: 20.4 % (ref 12.0–46.0)
Lymphs Abs: 1.8 10*3/uL (ref 0.7–4.0)
MCHC: 32.8 g/dL (ref 30.0–36.0)
MCV: 92.8 fl (ref 78.0–100.0)
MONOS PCT: 5.4 % (ref 3.0–12.0)
Monocytes Absolute: 0.5 10*3/uL (ref 0.1–1.0)
Neutro Abs: 6.4 10*3/uL (ref 1.4–7.7)
Neutrophils Relative %: 72.2 % (ref 43.0–77.0)
Platelets: 302 10*3/uL (ref 150.0–400.0)
RBC: 4.55 Mil/uL (ref 3.87–5.11)
RDW: 13.6 % (ref 11.5–15.5)
WBC: 8.9 10*3/uL (ref 4.0–10.5)

## 2015-09-29 LAB — TSH: TSH: 2.28 u[IU]/mL (ref 0.35–4.50)

## 2015-09-29 LAB — VITAMIN B12: VITAMIN B 12: 588 pg/mL (ref 211–911)

## 2015-09-29 NOTE — Telephone Encounter (Signed)
Patient Name: MEILYN JOSUE DOB: April 21, 1966 Initial Comment caller states she has been having some heart palpitations and numbness in both arms- the numbness is only at night - has been going on since last wed Nurse Assessment Nurse: Marcelline Deist, RN, Kermit Balo Date/Time (Eastern Time): 09/29/2015 9:39:26 AM Confirm and document reason for call. If symptomatic, describe symptoms. You must click the next button to save text entered. ---Caller states she has been having some heart palpitations and numbness in both arms. The numbness is only at night, has been going on since last Wednesday. Also has yawning non stop at times. Has the patient traveled out of the country within the last 30 days? ---Not Applicable Does the patient have any new or worsening symptoms? ---Yes Will a triage be completed? ---Yes Related visit to physician within the last 2 weeks? ---No Does the PT have any chronic conditions? (i.e. diabetes, asthma, etc.) ---Yes List chronic conditions. ---on Tobramax, thyroid rx Did the patient indicate they were pregnant? ---No Is this a behavioral health or substance abuse call? ---No Guidelines Guideline Title Affirmed Question Affirmed Notes Neurologic Deficit [1] Weakness of arm / hand, or leg / foot AND [2] is a chronic symptom (recurrent or ongoing AND present > 4 weeks) Final Disposition User See PCP When Office is Open (within 3 days) Marcelline Deist, RN, Kermit Balo Comments When questioned, caller states she can feel her heart pounding, no extra or skipped beats noted. Caller states her arms feel like they are asleep, it doesn't affect her hands, though. Caller scheduled to see Debbrah Alar NP as her provider is booked today. Referrals REFERRED TO PCP OFFICE Disagree/Comply: Comply

## 2015-09-29 NOTE — Assessment & Plan Note (Signed)
Improved since last visit. Continue prozac, wellbutrin.

## 2015-09-29 NOTE — Assessment & Plan Note (Signed)
Obtain tsh, cont synthroid.

## 2015-09-29 NOTE — Progress Notes (Signed)
Pre visit review using our clinic review tool, if applicable. No additional management support is needed unless otherwise documented below in the visit note. 

## 2015-09-29 NOTE — Progress Notes (Signed)
Subjective:    Patient ID: Gwendolyn Shields, female    DOB: 1966-07-08, 50 y.o.   MRN: [id number]  HPI   Gwendolyn Shields is a 50 yr old female who presents today with report of palpitations x 1 week.  Began 1 week ago.  Comes and goes.  Usually occurs at rest, worst in evening.  1-2 caffeine a day.  Denies CP or SOB.  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  Later she was told that she did not have murmur or mitral valve issue.   Depression- reports that depression is improved since she was started on prozac over the summer. She reports + compliance with medication. She did not follow up after starting medication as instructed by pcp. Reports feeling less tearful. Continues to have a lot of stress. Trouble staying asleep but falls asleep OK.    Numbness- she reports numbness in both arms every morning when she wakes up for 1 week.  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  + neck tightness but no new neck pain.   Hypothyroid- Reports that "I am the fattest I've ever been."  + compliance with synthroid. Not exercising. Wt Readings from Last 3 Encounters:  09/29/15 205 lb 3.2 oz (93.078 kg)  06/30/15 198 lb 3.2 oz (89.903 kg)  03/21/15 198 lb 2 oz (89.869 kg)   Energy is poor. + weight gain.  Headaches are bad.  Has had a lot of stress.    Review of Systems See HPI  Past Medical History  Diagnosis Date  . Depression   . STD (sexually transmitted disease)     HSV 2  . Migraine   . DVT (deep venous thrombosis) ([place])   . Hyperlipidemia 08/2014    Social History   Social History  . Marital Status: Married    Spouse Name: N/A  . Number of Children: N/A  . Years of Education: N/A   Occupational History  . Not on file.   Social History Main Topics  . Smoking status: Never Smoker   . Smokeless tobacco: Never Used  . Alcohol Use: 0.0 oz/week    0 Standard drinks or equivalent per week     Comment: occ  glass of wine (2-3 glasses of wine per month)  . Drug Use: No  . Sexual Activity: No     Comment: Mirena   Other Topics Concern  . Not on file   Social History Narrative    Past Surgical History  Procedure Laterality Date  . Cervical biopsy  w/ loop electrode excision      62yrs ago  . Breast biopsy Left 2001    benign  . Dilation and curettage of uterus      age 67  . Intrauterine device (iud) insertion  07/19/2014    Mirena    Family History  Problem Relation Age of Onset  . Breast cancer Mother 69    recurence of breast cancer early 31's then developed bone cancer ate 10   . Breast cancer Mother 105    then developed bone cancer from recurence age 79  . Heart failure Father 47  . Hypertension Father   . Stroke Maternal Grandmother   . Diabetes Maternal Grandfather   . Diabetes Paternal Grandmother   . Cancer Sister 78    endometrial cancer    Allergies  Allergen Reactions  . Lipitor [Atorvastatin] Nausea  Only and Other (See Comments)    HA    Current Outpatient Prescriptions on File Prior to Visit  Medication Sig Dispense Refill  . buPROPion (WELLBUTRIN XL) 300 MG 24 hr tablet Take 1 tablet (300 mg total) by mouth daily. 90 tablet 0  . butalbital-aspirin-caffeine (FIORINAL) 50-325-40 MG per capsule Take 1 capsule by mouth 3 (three) times daily as needed for headache. 45 capsule 1  . FLUoxetine (PROZAC) 20 MG tablet Take 1 tablet (20 mg total) by mouth daily. 90 tablet 0  . levothyroxine (SYNTHROID, LEVOTHROID) 112 MCG tablet TAKE 1 TABLET(112 MCG) BY MOUTH DAILY 30 tablet 0  . norethindrone (MICRONOR,CAMILA,ERRIN) 0.35 MG tablet Take 1 tablet (0.35 mg total) by mouth daily. 3 Package 2  . topiramate (TOPAMAX) 100 MG tablet TAKE 2 TABLETS(200 MG) BY MOUTH DAILY 60 tablet 2  . zolmitriptan (ZOMIG) 5 MG nasal solution Place 1 spray into the nose as needed for migraine. 6 Units 1   No current facility-administered medications on file prior to visit.    BP 123/52  mmHg  Pulse 67  Temp(Src) 98 F (36.7 C) (Oral)  Resp 16  Ht 5\' 7"  (1.702 m)  Wt 205 lb 3.2 oz (93.078 kg)  BMI 32.13 kg/m2  SpO2 100%       Objective:   Physical Exam  Constitutional: She is oriented to person, place, and time. She appears well-developed and well-nourished.  HENT:  Head: Normocephalic and atraumatic.  Eyes: No scleral icterus.  Cardiovascular: Normal rate, regular rhythm and normal heart sounds.   No murmur heard. Pulmonary/Chest: Effort normal and breath sounds normal. No respiratory distress. She has no wheezes.  Abdominal: Soft. She exhibits no distension. There is no tenderness.  Musculoskeletal: She exhibits no edema.  Neurological: She is alert and oriented to person, place, and time. She exhibits normal muscle tone.  Bilateral UE strength and hand grasps 5/5 Negative Tinels, Neg Phalans  Skin: Skin is warm and dry.  Psychiatric: Her behavior is normal. Judgment and thought content normal.  Flat affect          Assessment & Plan:  Palpitations- ? PVC's ? Anxiety related. Denies SOB or chest pain.  Need to check CBC to rule out anemia, bmet to assess for electrolyte abnormality and tsh to evaluate thyroid status. EKG tracing is personally reviewed.  EKG notes NSR.  No acute changes. Will also refer for 2D echo to assess mitral valve.   Arm Numbness- intermittent.  Obtain TSH, b12. If work up unrevealing and symptoms persist, consider neuro referral.

## 2015-09-29 NOTE — Telephone Encounter (Signed)
Patient called stating that she was having heart palpations and tingling in her arm. Patient was transferred to [facility] @ 0930

## 2015-09-29 NOTE — Patient Instructions (Signed)
Please complete lab work prior to leaving. Call if you develop more frequent palpitations. You will be contacted about scheduling your echocardiogram.

## 2015-09-30 NOTE — Telephone Encounter (Signed)
Patient seen by M,Osullivan. 09/29/15

## 2015-10-27 ENCOUNTER — Encounter: Payer: Self-pay | Admitting: Family Medicine

## 2015-10-27 ENCOUNTER — Telehealth: Payer: Self-pay | Admitting: *Deleted

## 2015-10-27 ENCOUNTER — Ambulatory Visit (INDEPENDENT_AMBULATORY_CARE_PROVIDER_SITE_OTHER): Payer: BLUE CROSS/BLUE SHIELD | Admitting: Family Medicine

## 2015-10-27 VITALS — BP 108/70 | HR 77 | Temp 98.6°F | Ht 67.0 in | Wt 203.0 lb

## 2015-10-27 DIAGNOSIS — G43009 Migraine without aura, not intractable, without status migrainosus: Secondary | ICD-10-CM | POA: Diagnosis not present

## 2015-10-27 DIAGNOSIS — M189 Osteoarthritis of first carpometacarpal joint, unspecified: Secondary | ICD-10-CM

## 2015-10-27 DIAGNOSIS — M180 Bilateral primary osteoarthritis of first carpometacarpal joints: Secondary | ICD-10-CM

## 2015-10-27 MED ORDER — METHOCARBAMOL 500 MG PO TABS: 500.0000 mg | ORAL_TABLET | Freq: Three times a day (TID) | ORAL | Status: DC | PRN

## 2015-10-27 MED ORDER — TOPIRAMATE 100 MG PO TABS: ORAL_TABLET | ORAL | Status: DC

## 2015-10-27 MED ORDER — FLUOXETINE HCL 20 MG PO TABS: 20.0000 mg | ORAL_TABLET | Freq: Every day | ORAL | Status: DC

## 2015-10-27 MED ORDER — LEVOTHYROXINE SODIUM 112 MCG PO TABS: ORAL_TABLET | ORAL | Status: DC

## 2015-10-27 MED ORDER — BUPROPION HCL ER (XL) 300 MG PO TB24: 300.0000 mg | ORAL_TABLET | Freq: Every day | ORAL | Status: DC

## 2015-10-27 MED ORDER — MELOXICAM 15 MG PO TABS: 15.0000 mg | ORAL_TABLET | Freq: Every day | ORAL | Status: DC

## 2015-10-27 MED ORDER — BUTALBITAL-ASA-CAFFEINE 50-325-40 MG PO CAPS: 1.0000 | ORAL_CAPSULE | Freq: Three times a day (TID) | ORAL | Status: DC | PRN

## 2015-10-27 MED ORDER — ZOLMITRIPTAN 5 MG NA SOLN: 1.0000 | NASAL | Status: DC | PRN

## 2015-10-27 NOTE — Progress Notes (Signed)
   Subjective:    Patient ID: Gwendolyn Shields, female    DOB: 1965-11-22, 50 y.o.   MRN: [id number]  HPI HA- 'increasing a lot'.  Pt has increased Topamax to 300mg  every other day, 200mg  on the other days.  Zomig is still effective but Fiorinal is not.  Has not seen Neuro (daughter sees Dr Sima Matas)   Bilateral hand numbness- occuring at night.  Was previously waking pt from sleep but this has improved.  Pt thinks she tucks her hands while she sleeps.  Pt having pain at bilateral 1st CMC joints.     Review of Systems For ROS see HPI     Objective:   Physical Exam  Constitutional: She is oriented to person, place, and time. She appears well-developed and well-nourished. No distress.  HENT:  Head: Normocephalic and atraumatic.  Eyes: Conjunctivae and EOM are normal. Pupils are equal, round, and reactive to light.  Cardiovascular: Intact distal pulses.   Musculoskeletal: She exhibits tenderness (TTP over 1st CMC joints bilaterally).  Neurological: She is alert and oriented to person, place, and time.  Skin: Skin is warm and dry.  Psychiatric: She has a normal mood and affect. Her behavior is normal. Thought content normal.  Vitals reviewed.         Assessment & Plan:

## 2015-10-27 NOTE — Assessment & Plan Note (Signed)
New.  Pt w/ both arthritic and carpal tunnel sxs.  Start daily NSAID.  Nightly wrist splints.  If no improvement, will need referral to hand specialist.  Will follow.

## 2015-10-27 NOTE — Telephone Encounter (Addendum)
Received PA for Zomig; CMM Key [id number] 02/13 ID:[id number], ID [id number]

## 2015-10-27 NOTE — Progress Notes (Signed)
Pre visit review using our clinic review tool, if applicable. No additional management support is needed unless otherwise documented below in the visit note. 

## 2015-10-27 NOTE — Patient Instructions (Signed)
Schedule your complete physical at your convenience Continue the Topamax 3 tabs daily on Mon/Wed/Fri Start the Meloxicam once daily (w/ food) for inflammation Use the Methocarbamol (Robaxin) as needed for headache or muscle spasm Start wearing wrist braces at night while sleeping- if no improvement in hand/wrist pain let me know Call with any questions or concerns Happy Valentine's Day!!!

## 2015-10-27 NOTE — Assessment & Plan Note (Signed)
Deteriorated.  Pt's Fiorinal is no longer as effective.  Will add Robaxin PRN.  Continue Topamax.  Discussed her seeing Neuro for ongoing management of HAs.  Pt will think about this.  Reviewed supportive care and red flags that should prompt return.  Pt expressed understanding and is in agreement w/ plan.

## 2015-10-29 NOTE — Telephone Encounter (Signed)
PA began in covermymeds 

## 2015-11-03 MED ORDER — ZOLMITRIPTAN 5 MG NA SOLN: 1.0000 | NASAL | Status: DC | PRN

## 2015-11-03 NOTE — Telephone Encounter (Signed)
PA approved Effective from 10/29/2015 through 09/12/2038.

## 2015-11-03 NOTE — Addendum Note (Signed)
Addended by: Davis Gourd on: 11/03/2015 02:05 PM   Modules accepted: Orders

## 2016-01-05 DIAGNOSIS — H524 Presbyopia: Secondary | ICD-10-CM | POA: Diagnosis not present

## 2016-02-13 ENCOUNTER — Other Ambulatory Visit: Payer: Self-pay | Admitting: Family Medicine

## 2016-02-13 NOTE — Telephone Encounter (Signed)
Medication filled to pharmacy as requested.   

## 2016-03-10 ENCOUNTER — Other Ambulatory Visit: Payer: Self-pay | Admitting: Family Medicine

## 2016-03-10 NOTE — Telephone Encounter (Signed)
Last OV 10/27/15 Fiorinal last filled 10/27/15 #45 with 1

## 2016-03-10 NOTE — Telephone Encounter (Signed)
Medication filled to pharmacy as requested.   

## 2016-03-12 ENCOUNTER — Ambulatory Visit (INDEPENDENT_AMBULATORY_CARE_PROVIDER_SITE_OTHER): Payer: BLUE CROSS/BLUE SHIELD | Admitting: Obstetrics and Gynecology

## 2016-03-12 ENCOUNTER — Encounter: Payer: Self-pay | Admitting: Obstetrics and Gynecology

## 2016-03-12 VITALS — BP 106/64 | HR 64 | Resp 16 | Ht 66.0 in | Wt 202.4 lb

## 2016-03-12 DIAGNOSIS — N393 Stress incontinence (female) (male): Secondary | ICD-10-CM

## 2016-03-12 DIAGNOSIS — N912 Amenorrhea, unspecified: Secondary | ICD-10-CM | POA: Diagnosis not present

## 2016-03-12 DIAGNOSIS — Z0000 Encounter for general adult medical examination without abnormal findings: Secondary | ICD-10-CM | POA: Diagnosis not present

## 2016-03-12 DIAGNOSIS — Z1211 Encounter for screening for malignant neoplasm of colon: Secondary | ICD-10-CM | POA: Diagnosis not present

## 2016-03-12 DIAGNOSIS — N8110 Cystocele, unspecified: Secondary | ICD-10-CM | POA: Diagnosis not present

## 2016-03-12 DIAGNOSIS — Z01419 Encounter for gynecological examination (general) (routine) without abnormal findings: Secondary | ICD-10-CM | POA: Diagnosis not present

## 2016-03-12 DIAGNOSIS — IMO0002 Reserved for concepts with insufficient information to code with codable children: Secondary | ICD-10-CM

## 2016-03-12 NOTE — Progress Notes (Signed)
Patient ID: Gwendolyn Shields, female   DOB: Jan 12, 1966, 50 y.o.   MRN: [id number] 50 y.o. [id number] Married Caucasian female here for annual exam.    Patient complaining of urinary incontinence and possible prolapse. Leaks with cough, sneeze.  Normal bowel movements.  On Camilla OCPs. No menses.  No hot flashes.   Gaining weight.  Not exercising.  Not feeling motivated.  Now on Prozac for about one year. Doing alterations in her thyroid function. Does not feel well at her current TSH level.   PCP:   Annye Asa, MD  No LMP recorded. Patient is not currently having periods (Reason: Oral contraceptives).     Period Cycle (Days):  (continuous OCPs)     Sexually active: No. female The current method of family planning is oral progesterone-only contraceptive--Micronor.    Exercising: No.   Smoker:  no  Health Maintenance: Pap:  03-11-15 Neg:Neg HR HPV History of abnormal Pap:  Yes, Hx of LEEP 1997. Paps normal since [facility]:  04-29-15 Density A/Neg/[facility]Rads[facility] Colonoscopy:  NEVER BMD:   n/a  Result  n/a TDaP:  2014 Gardasil:   N/A  Screening Labs:  Hb today: PCP, Urine today:  ---   reports that she has never smoked. She has never used smokeless tobacco. She reports that she drinks alcohol. She reports that she does not use illicit drugs.  Past Medical History  Diagnosis Date  . Depression   . STD (sexually transmitted disease)     HSV 2  . Migraine   . DVT (deep venous thrombosis) ([place])   . Hyperlipidemia 08/2014  . Urinary incontinence     Past Surgical History  Procedure Laterality Date  . Cervical biopsy  w/ loop electrode excision      60yrs ago  . Breast biopsy Left 2001    benign  . Dilation and curettage of uterus      age 20  . Intrauterine device (iud) insertion  07/19/2014    Mirena    Current Outpatient Prescriptions  Medication Sig Dispense Refill  . buPROPion (WELLBUTRIN XL) 300 MG 24 hr tablet Take 1 tablet (300 mg total) by mouth daily.  90 tablet 1  . butalbital-aspirin-caffeine (FIORINAL) 50-325-40 MG capsule TAKE 1 CAPSULE BY MOUTH THREE TIMES DAILY AS NEEDED FOR HEADACHE 45 capsule 0  . FLUoxetine (PROZAC) 20 MG tablet Take 1 tablet (20 mg total) by mouth daily. 90 tablet 1  . levothyroxine (SYNTHROID, LEVOTHROID) 112 MCG tablet TAKE 1 TABLET(112 MCG) BY MOUTH DAILY 30 tablet 6  . meloxicam (MOBIC) 15 MG tablet TAKE 1 TABLET(15 MG) BY MOUTH DAILY 30 tablet 0  . norethindrone (MICRONOR,CAMILA,ERRIN) 0.35 MG tablet Take 1 tablet (0.35 mg total) by mouth daily. 3 Package 2  . topiramate (TOPAMAX) 100 MG tablet Take 3 tabs Mon/Wed/Fri and 2 tabs on the other days 90 tablet 3  . zolmitriptan (ZOMIG) 5 MG nasal solution Place 1 spray into the nose as needed for migraine. 6 Units 6   No current facility-administered medications for this visit.    Family History  Problem Relation Age of Onset  . Breast cancer Mother 63    recurence of breast cancer early 83's then developed bone cancer ate 18   . Breast cancer Mother 29    then developed bone cancer from recurence age 46  . Heart failure Father 43  . Hypertension Father   . Stroke Maternal Grandmother   . Diabetes Maternal Grandfather   . Diabetes Paternal Grandmother   .  Cancer Sister 45    endometrial cancer    ROS:  Pertinent items are noted in HPI.  Otherwise, a comprehensive ROS was negative.  Exam:   BP 106/64 mmHg  Pulse 64  Resp 16  Ht 5\' 6"  (1.676 m)  Wt 202 lb 6.4 oz (91.808 kg)  BMI 32.68 kg/m2    General appearance: alert, cooperative and appears stated age Head: Normocephalic, without obvious abnormality, atraumatic Neck: no adenopathy, supple, symmetrical, trachea midline and thyroid normal to inspection and palpation Lungs: clear to auscultation bilaterally Breasts: normal appearance, no masses or tenderness, Inspection negative, No nipple retraction or dimpling, No nipple discharge or bleeding, No axillary or supraclavicular adenopathy Heart:  regular rate and rhythm Abdomen:  soft, non-tender; no masses, no organomegaly Extremities: extremities normal, atraumatic, no cyanosis or edema Skin: Skin color, texture, turgor normal. No rashes or lesions Lymph nodes: Cervical, supraclavicular, and axillary nodes normal. No abnormal inguinal nodes palpated Neurologic: Grossly normal  Pelvic: External genitalia:  no lesions              Urethra:  normal appearing urethra with no masses, tenderness or lesions              Bartholins and Skenes: normal                 Vagina: normal appearing vagina with normal color and discharge, no lesions.  First degree cystocele with Valsalva.              Cervix: no lesions              Pap taken: No. Bimanual Exam:  Uterus:  normal size, contour, position, consistency, mobility, non-tender              Adnexa: normal adnexa and no mass, fullness, tenderness              Rectal exam: Yes.  .  Confirms.              Anus:  normal sphincter tone, no lesions  Chaperone was present for exam.  Assessment:   Well woman visit with normal exam. Amenorrhea.  On Micronor.  Stress incontinence. First degree cystocele. Weight gain.  Depression.  On Wellbutrin and Prozac. Hx DVT.  Plan: Yearly mammogram recommended after age 85.  Recommended self breast exam.  Pap and HR HPV as above. Discussed Calcium, Vitamin D, regular exercise program including cardiovascular and weight bearing exercise. Labs performed.  Yes.  .   See orders. Prescription medication(s) given.  No..  Stop Micronor and will return to check [facility] and estradiol.  Referral for colonoscopy. Follow up annually and prn.   Additional counseling given.  Yes.  . __15_____ minutes face to face time of which over 50% was spent in counseling regarding cystocele, stress incontinence, and weight gain.  We discussed Impressa, pessary use, Kegel's pelvic floor PT, and midurethral slings.  Patient did ask about doing an abdominoplasty at the same  time as a urinary incontinence procedure. Will will see if she can come off her Micronor.  She will contact her PCP regarding her medication for depression to see if there are other options for her.  After visit summary provided.

## 2016-03-12 NOTE — Patient Instructions (Signed)
Health Maintenance, Female Adopting a healthy lifestyle and getting preventive care can go a long way to promote health and wellness. Talk with your health care provider about what schedule of regular examinations is right for you. This is a good chance for you to check in with your provider about disease prevention and staying healthy. In between checkups, there are plenty of things you can do on your own. Experts have done a lot of research about which lifestyle changes and preventive measures are most likely to keep you healthy. Ask your health care provider for more information. WEIGHT AND DIET  Eat a healthy diet  Be sure to include plenty of vegetables, fruits, low-fat dairy products, and lean protein.  Do not eat a lot of foods high in solid fats, added sugars, or salt.  Get regular exercise. This is one of the most important things you can do for your health.  Most adults should exercise for at least 150 minutes each week. The exercise should increase your heart rate and make you sweat (moderate-intensity exercise).  Most adults should also do strengthening exercises at least twice a week. This is in addition to the moderate-intensity exercise.  Maintain a healthy weight  Body mass index (BMI) is a measurement that can be used to identify possible weight problems. It estimates body fat based on height and weight. Your health care provider can help determine your BMI and help you achieve or maintain a healthy weight.  For females 20 years of age and older:   A BMI below 18.5 is considered underweight.  A BMI of 18.5 to 24.9 is normal.  A BMI of 25 to 29.9 is considered overweight.  A BMI of 30 and above is considered obese.  Watch levels of cholesterol and blood lipids  You should start having your blood tested for lipids and cholesterol at 51 years of age, then have this test every 5 years.  You may need to have your cholesterol levels checked more often if:  Your lipid  or cholesterol levels are high.  You are older than 50 years of age.  You are at high risk for heart disease.  CANCER SCREENING   Lung Cancer  Lung cancer screening is recommended for adults 55-80 years old who are at high risk for lung cancer because of a history of smoking.  A yearly low-dose CT scan of the lungs is recommended for people who:  Currently smoke.  Have quit within the past 15 years.  Have at least a 30-pack-year history of smoking. A pack year is smoking an average of one pack of cigarettes a day for 1 year.  Yearly screening should continue until it has been 15 years since you quit.  Yearly screening should stop if you develop a health problem that would prevent you from having lung cancer treatment.  Breast Cancer  Practice breast self-awareness. This means understanding how your breasts normally appear and feel.  It also means doing regular breast self-exams. Let your health care provider know about any changes, no matter how small.  If you are in your 20s or 30s, you should have a clinical breast exam (CBE) by a health care provider every 1-3 years as part of a regular health exam.  If you are 40 or older, have a CBE every year. Also consider having a breast X-ray (mammogram) every year.  If you have a family history of breast cancer, talk to your health care provider about genetic screening.  If you   are at high risk for breast cancer, talk to your health care provider about having an MRI and a mammogram every year.  Breast cancer gene (BRCA) assessment is recommended for women who have family members with BRCA-related cancers. BRCA-related cancers include:  Breast.  Ovarian.  Tubal.  Peritoneal cancers.  Results of the assessment will determine the need for genetic counseling and BRCA1 and BRCA2 testing. Cervical Cancer Your health care provider may recommend that you be screened regularly for cancer of the pelvic organs (ovaries, uterus, and  vagina). This screening involves a pelvic examination, including checking for microscopic changes to the surface of your cervix (Pap test). You may be encouraged to have this screening done every 3 years, beginning at age 21.  For women ages 30-65, health care providers may recommend pelvic exams and Pap testing every 3 years, or they may recommend the Pap and pelvic exam, combined with testing for human papilloma virus (HPV), every 5 years. Some types of HPV increase your risk of cervical cancer. Testing for HPV may also be done on women of any age with unclear Pap test results.  Other health care providers may not recommend any screening for nonpregnant women who are considered low risk for pelvic cancer and who do not have symptoms. Ask your health care provider if a screening pelvic exam is right for you.  If you have had past treatment for cervical cancer or a condition that could lead to cancer, you need Pap tests and screening for cancer for at least 20 years after your treatment. If Pap tests have been discontinued, your risk factors (such as having a new sexual partner) need to be reassessed to determine if screening should resume. Some women have medical problems that increase the chance of getting cervical cancer. In these cases, your health care provider may recommend more frequent screening and Pap tests. Colorectal Cancer  This type of cancer can be detected and often prevented.  Routine colorectal cancer screening usually begins at 50 years of age and continues through 51 years of age.  Your health care provider may recommend screening at an earlier age if you have risk factors for colon cancer.  Your health care provider may also recommend using home test kits to check for hidden blood in the stool.  A small camera at the end of a tube can be used to examine your colon directly (sigmoidoscopy or colonoscopy). This is done to check for the earliest forms of colorectal  cancer.  Routine screening usually begins at age 50.  Direct examination of the colon should be repeated every 5-10 years through 51 years of age. However, you may need to be screened more often if early forms of precancerous polyps or small growths are found. Skin Cancer  Check your skin from head to toe regularly.  Tell your health care provider about any new moles or changes in moles, especially if there is a change in a mole's shape or color.  Also tell your health care provider if you have a mole that is larger than the size of a pencil eraser.  Always use sunscreen. Apply sunscreen liberally and repeatedly throughout the day.  Protect yourself by wearing long sleeves, pants, a wide-brimmed hat, and sunglasses whenever you are outside. HEART DISEASE, DIABETES, AND HIGH BLOOD PRESSURE   High blood pressure causes heart disease and increases the risk of stroke. High blood pressure is more likely to develop in:  People who have blood pressure in the high end   of the normal range (130-139/85-89 mm Hg).  People who are overweight or obese.  People who are African American.  If you are 38-23 years of age, have your blood pressure checked every 3-5 years. If you are 61 years of age or older, have your blood pressure checked every year. You should have your blood pressure measured twice--once when you are at a hospital or clinic, and once when you are not at a hospital or clinic. Record the average of the two measurements. To check your blood pressure when you are not at a hospital or clinic, you can use:  An automated blood pressure machine at a pharmacy.  A home blood pressure monitor.  If you are between 45 years and 39 years old, ask your health care provider if you should take aspirin to prevent strokes.  Have regular diabetes screenings. This involves taking a blood sample to check your fasting blood sugar level.  If you are at a normal weight and have a low risk for diabetes,  have this test once every three years after 51 years of age.  If you are overweight and have a high risk for diabetes, consider being tested at a younger age or more often. PREVENTING INFECTION  Hepatitis B  If you have a higher risk for hepatitis B, you should be screened for this virus. You are considered at high risk for hepatitis B if:  You were born in a country where hepatitis B is common. Ask your health care provider which countries are considered high risk.  Your parents were born in a high-risk country, and you have not been immunized against hepatitis B (hepatitis B vaccine).  You have HIV or AIDS.  You use needles to inject street drugs.  You live with someone who has hepatitis B.  You have had sex with someone who has hepatitis B.  You get hemodialysis treatment.  You take certain medicines for conditions, including cancer, organ transplantation, and autoimmune conditions. Hepatitis C  Blood testing is recommended for:  Everyone born from [birth year] through 1966.  Anyone with known risk factors for hepatitis C. Sexually transmitted infections (STIs)  You should be screened for sexually transmitted infections (STIs) including gonorrhea and chlamydia if:  You are sexually active and are younger than 51 years of age.  You are older than 51 years of age and your health care provider tells you that you are at risk for this type of infection.  Your sexual activity has changed since you were last screened and you are at an increased risk for chlamydia or gonorrhea. Ask your health care provider if you are at risk.  If you do not have HIV, but are at risk, it may be recommended that you take a prescription medicine daily to prevent HIV infection. This is called pre-exposure prophylaxis (PrEP). You are considered at risk if:  You are sexually active and do not regularly use condoms or know the HIV status of your partner(s).  You take drugs by injection.  You are sexually  active with a partner who has HIV. Talk with your health care provider about whether you are at high risk of being infected with HIV. If you choose to begin PrEP, you should first be tested for HIV. You should then be tested every 3 months for as long as you are taking PrEP.  PREGNANCY   If you are premenopausal and you may become pregnant, ask your health care provider about preconception counseling.  If you may  become pregnant, take 400 to 800 micrograms (mcg) of folic acid every day.  If you want to prevent pregnancy, talk to your health care provider about birth control (contraception). OSTEOPOROSIS AND MENOPAUSE   Osteoporosis is a disease in which the bones lose minerals and strength with aging. This can result in serious bone fractures. Your risk for osteoporosis can be identified using a bone density scan.  If you are 61 years of age or older, or if you are at risk for osteoporosis and fractures, ask your health care provider if you should be screened.  Ask your health care provider whether you should take a calcium or vitamin D supplement to lower your risk for osteoporosis.  Menopause may have certain physical symptoms and risks.  Hormone replacement therapy may reduce some of these symptoms and risks. Talk to your health care provider about whether hormone replacement therapy is right for you.  HOME CARE INSTRUCTIONS   Schedule regular health, dental, and eye exams.  Stay current with your immunizations.   Do not use any tobacco products including cigarettes, chewing tobacco, or electronic cigarettes.  If you are pregnant, do not drink alcohol.  If you are breastfeeding, limit how much and how often you drink alcohol.  Limit alcohol intake to no more than 1 drink per day for nonpregnant women. One drink equals 12 ounces of beer, 5 ounces of wine, or 1 ounces of hard liquor.  Do not use street drugs.  Do not share needles.  Ask your health care provider for help if  you need support or information about quitting drugs.  Tell your health care provider if you often feel depressed.  Tell your health care provider if you have ever been abused or do not feel safe at home.   This information is not intended to replace advice given to you by your health care provider. Make sure you discuss any questions you have with your health care provider.   Document Released: 03/15/2011 Document Revised: 09/20/2014 Document Reviewed: 08/01/2013 Elsevier Interactive Patient Education [employer].

## 2016-03-23 ENCOUNTER — Telehealth: Payer: Self-pay | Admitting: Obstetrics and Gynecology

## 2016-03-23 NOTE — Telephone Encounter (Signed)
Left detailed message at number provided [phone], okay per ROI. Advised it is okay to keep her lab appointment as scheduled for 03/26/2016. Advised if she has any further questions to return call to the office.  Routing to provider for final review. Patient agreeable to disposition. Will close encounter.

## 2016-03-23 NOTE — Telephone Encounter (Signed)
Patient calling for advice on a medication she is coming in for a lab appointment for on 03/26/16. She is not sure of the name of the medication.

## 2016-03-23 NOTE — Telephone Encounter (Signed)
Patient is coming in to have a [place] and estradiol level checked. Per OV note on 03/12/2016 she was to stop her Micronor before having these labs. This will help to determine whether she needs to remain on Micronor or not. Spoke with patient. Patient states that she stopped taking her Micronor on 03/15/2016. She forgot she was to discontinue taking her pills and took 1 pill yesterday. She is scheduled for a lab appointment on 03/26/2016. Asking if she needs to rearrange this appointment. Advised I will speak with Dr.Silva and return call with further recommendations. She is agreeable.

## 2016-03-23 NOTE — Telephone Encounter (Signed)
Ok to keep appointment for lab visit.

## 2016-03-26 ENCOUNTER — Other Ambulatory Visit: Payer: BLUE CROSS/BLUE SHIELD

## 2016-03-26 ENCOUNTER — Other Ambulatory Visit: Payer: Self-pay | Admitting: Family Medicine

## 2016-03-26 NOTE — Telephone Encounter (Signed)
Medication filled to pharmacy as requested.   

## 2016-03-30 ENCOUNTER — Other Ambulatory Visit (INDEPENDENT_AMBULATORY_CARE_PROVIDER_SITE_OTHER): Payer: BLUE CROSS/BLUE SHIELD

## 2016-03-30 DIAGNOSIS — N912 Amenorrhea, unspecified: Secondary | ICD-10-CM

## 2016-03-31 ENCOUNTER — Other Ambulatory Visit: Payer: Self-pay | Admitting: Family Medicine

## 2016-03-31 LAB — ESTRADIOL: Estradiol: 74 pg/mL

## 2016-03-31 LAB — FOLLICLE STIMULATING HORMONE: FSH: 53.6 m[IU]/mL

## 2016-04-01 ENCOUNTER — Other Ambulatory Visit: Payer: Self-pay

## 2016-04-01 MED ORDER — NORETHINDRONE 0.35 MG PO TABS: 1.0000 | ORAL_TABLET | Freq: Every day | ORAL | Status: DC

## 2016-04-01 NOTE — Telephone Encounter (Signed)
-----   Message from Nunzio Cobbs, MD sent at 03/31/2016  6:12 AM EDT ----- Results to patient through My Chart. "Good morning Gracelyn,   Your blood work indicates you are perimenopausal, but not fully in menopause yet.   You still need the birth control, so I would recommend continuing with the Micronor.  Please call for any questions.   Josefa Half, MD"

## 2016-04-01 NOTE — Telephone Encounter (Signed)
Spoke with patient and notified labs indicate perimenopausal not menopause.  Advised patient she needs to continue with Micronor.  Patient states she needs refill sent to [facility]/[place]. Request #3packs at a time.will give 3 packs with 3 refills last AEX 03-12-16 and next AEX 03-18-17.Routed to Dr. Quincy Simmonds

## 2016-04-01 NOTE — Telephone Encounter (Signed)
Medication filled to pharmacy as requested.   

## 2016-04-05 ENCOUNTER — Other Ambulatory Visit: Payer: Self-pay | Admitting: Obstetrics and Gynecology

## 2016-04-05 NOTE — Telephone Encounter (Signed)
Medication refill request: Gwendolyn Shields Last AEX: 03/12/16 BS Next AEX: 03/18/17 BS Last MMG (if hormonal medication request): 04/28/15 [id number] Refill authorized: 04/01/16 #3Packages 0R.

## 2016-04-08 ENCOUNTER — Other Ambulatory Visit: Payer: Self-pay | Admitting: Obstetrics and Gynecology

## 2016-04-08 DIAGNOSIS — Z1231 Encounter for screening mammogram for malignant neoplasm of breast: Secondary | ICD-10-CM

## 2016-05-03 ENCOUNTER — Telehealth: Payer: Self-pay | Admitting: Obstetrics and Gynecology

## 2016-05-03 ENCOUNTER — Ambulatory Visit: Payer: BLUE CROSS/BLUE SHIELD

## 2016-05-03 NOTE — Telephone Encounter (Signed)
Do UPT first.   If negative, may do the following: She actually can take two Micronor per day for the next week to see if this will stop the bleeding.  Then resume one tablet daily.  The Micronor is similar medication to Aygestin, the latter of which is taken as 2.5 - 5 mg daily to treat bleeding.

## 2016-05-03 NOTE — Telephone Encounter (Signed)
Spoke with patient. Patient states that since restarting her Micronor after stopping to have her hormone levels tested she has been having continual bleeding. Reports she started her new pack around the week of July 24th unsure of the exact day. Denies missing any pills or taking any pills late. States she did not have any bleeding while off of the pills, but that bleeding started when she restarted the pills. Denies any heavy bleeding. Reports changing her pad twice per day. States that she is fatigued, but that this is normal for her. Denies SOB or dizziness. Patient is in the second week of her current pill pack. Asking for advice on how to stop her bleeding. Advised I will speak with Dr.Silva and return call with further recommendations. She is agreeable.

## 2016-05-03 NOTE — Telephone Encounter (Signed)
Patient is asking to talk with a nurse regarding her cycle. Patient said she is still having a period while on medication.

## 2016-05-03 NOTE — Telephone Encounter (Signed)
Call to patient. Per DPR, can leave detailed message on cell number.  Call to patient and left detailed instructions from Dr Quincy Simmonds.  Instructed to do UPT first. Always need to rule out pregnancy when patient has unusual bleeding. Then if negative, may increase Micronor to two tabs daily for 7 days to stop bleeding then decrease back to one tablet daily. Instructed to call back tomorrow to review instructions. Just wanted to call her so she could begin this tonight and hopefully help stop bleeding. Left message to call back tomorrow and speak to [place] or Gay Filler.

## 2016-05-07 ENCOUNTER — Ambulatory Visit: Payer: BLUE CROSS/BLUE SHIELD | Admitting: Family Medicine

## 2016-05-10 ENCOUNTER — Encounter: Payer: Self-pay | Admitting: Family Medicine

## 2016-05-10 ENCOUNTER — Ambulatory Visit (INDEPENDENT_AMBULATORY_CARE_PROVIDER_SITE_OTHER): Payer: BLUE CROSS/BLUE SHIELD | Admitting: Family Medicine

## 2016-05-10 VITALS — BP 106/77 | HR 100 | Temp 98.1°F | Resp 17 | Ht 66.0 in | Wt 209.1 lb

## 2016-05-10 DIAGNOSIS — D2261 Melanocytic nevi of right upper limb, including shoulder: Secondary | ICD-10-CM | POA: Diagnosis not present

## 2016-05-10 DIAGNOSIS — M2550 Pain in unspecified joint: Secondary | ICD-10-CM

## 2016-05-10 DIAGNOSIS — R5383 Other fatigue: Secondary | ICD-10-CM

## 2016-05-10 DIAGNOSIS — L814 Other melanin hyperpigmentation: Secondary | ICD-10-CM | POA: Diagnosis not present

## 2016-05-10 DIAGNOSIS — Z23 Encounter for immunization: Secondary | ICD-10-CM | POA: Diagnosis not present

## 2016-05-10 DIAGNOSIS — L819 Disorder of pigmentation, unspecified: Secondary | ICD-10-CM | POA: Diagnosis not present

## 2016-05-10 DIAGNOSIS — L821 Other seborrheic keratosis: Secondary | ICD-10-CM | POA: Diagnosis not present

## 2016-05-10 LAB — CBC WITH DIFFERENTIAL/PLATELET
Basophils Absolute: 0 cells/uL (ref 0–200)
Basophils Relative: 0 %
Eosinophils Absolute: 178 cells/uL (ref 15–500)
Eosinophils Relative: 2 %
HEMATOCRIT: 40.4 % (ref 35.0–45.0)
Hemoglobin: 13.4 g/dL (ref 11.7–15.5)
LYMPHS ABS: 2314 {cells}/uL (ref 850–3900)
LYMPHS PCT: 26 %
MCH: 30.2 pg (ref 27.0–33.0)
MCHC: 33.2 g/dL (ref 32.0–36.0)
MCV: 91.2 fL (ref 80.0–100.0)
MONO ABS: 623 {cells}/uL (ref 200–950)
MPV: 9.7 fL (ref 7.5–12.5)
Monocytes Relative: 7 %
NEUTROS PCT: 65 %
Neutro Abs: 5785 cells/uL (ref 1500–7800)
Platelets: 306 10*3/uL (ref 140–400)
RBC: 4.43 MIL/uL (ref 3.80–5.10)
RDW: 14 % (ref 11.0–15.0)
WBC: 8.9 10*3/uL (ref 3.8–10.8)

## 2016-05-10 LAB — BASIC METABOLIC PANEL
BUN: 11 mg/dL (ref 7–25)
CHLORIDE: 111 mmol/L — AB (ref 98–110)
CO2: 23 mmol/L (ref 20–31)
Calcium: 9.1 mg/dL (ref 8.6–10.4)
Creat: 0.98 mg/dL (ref 0.50–1.05)
GLUCOSE: 84 mg/dL (ref 65–99)
POTASSIUM: 4.3 mmol/L (ref 3.5–5.3)
Sodium: 142 mmol/L (ref 135–146)

## 2016-05-10 LAB — TSH: TSH: 2.78 mIU/L

## 2016-05-10 MED ORDER — BUTALBITAL-ASPIRIN-CAFFEINE 50-325-40 MG PO CAPS: ORAL_CAPSULE | ORAL | 0 refills | Status: DC

## 2016-05-10 MED ORDER — BUPROPION HCL ER (XL) 300 MG PO TB24: 300.0000 mg | ORAL_TABLET | Freq: Every day | ORAL | 1 refills | Status: DC

## 2016-05-10 NOTE — Progress Notes (Signed)
   Subjective:    Patient ID: Gwendolyn Shields, female    DOB: 28-Jul-1966, 50 y.o.   MRN: [id number]  HPI Fatigue- pt stopped her Prozac 6-8 weeks ago due to weight gain.  Increased joint pains- fingers, elbows, toes.  + body aches, legs feel heavy.  No energy.  sxs started ~2 months ago.  Husband reports loud snoring.     Review of Systems For ROS see HPI     Objective:   Physical Exam  Constitutional: She is oriented to person, place, and time. She appears well-developed and well-nourished. No distress.  HENT:  Head: Normocephalic and atraumatic.  Eyes: Conjunctivae and EOM are normal. Pupils are equal, round, and reactive to light.  Neck: Normal range of motion. Neck supple. No thyromegaly present.  Cardiovascular: Normal rate, regular rhythm, normal heart sounds and intact distal pulses.   No murmur heard. Pulmonary/Chest: Effort normal and breath sounds normal. No respiratory distress.  Abdominal: Soft. She exhibits no distension. There is no tenderness.  Musculoskeletal: She exhibits no edema, tenderness (no TTP over multiple trigger points) or deformity.  Lymphadenopathy:    She has no cervical adenopathy.  Neurological: She is alert and oriented to person, place, and time.  Skin: Skin is warm and dry.  Psychiatric: She has a normal mood and affect. Her behavior is normal.  Vitals reviewed.         Assessment & Plan:  Fatigue/polyarthralgia- discussed differential w/ pt including thyroid abnormality, fibromyalgia, sleep apnea, rheumatologic issues, depression/anxiety.  Check labs to r/o anemia, thyroid abnormality, electrolyte disturbance.  Check ANA, ESR.  If labs normal, will refer for sleep study to r/o sleep apnea.  If that is subsequently normal, will possibly need to restart SSRI.  Pt expressed understanding and is in agreement w/ plan.

## 2016-05-10 NOTE — Patient Instructions (Addendum)
Follow up as needed We'll notify you of your lab results and make any changes if needed If the labs are unrevealing, we'll proceed w/ a sleep study to assess the snoring and fatigue Continue to work on healthy diet and regular exercise- you can do it!!! Call with any questions or concerns Happy Labor Day!!!

## 2016-05-10 NOTE — Progress Notes (Signed)
Pre visit review using our clinic review tool, if applicable. No additional management support is needed unless otherwise documented below in the visit note. 

## 2016-05-11 LAB — SEDIMENTATION RATE: SED RATE: 1 mm/h (ref 0–20)

## 2016-05-11 LAB — ANA: Anti Nuclear Antibody(ANA): NEGATIVE

## 2016-05-12 ENCOUNTER — Other Ambulatory Visit: Payer: Self-pay | Admitting: Family Medicine

## 2016-05-12 DIAGNOSIS — R0683 Snoring: Secondary | ICD-10-CM

## 2016-05-13 ENCOUNTER — Telehealth: Payer: Self-pay | Admitting: Emergency Medicine

## 2016-05-13 MED ORDER — MELOXICAM 15 MG PO TABS: 15.0000 mg | ORAL_TABLET | Freq: Every day | ORAL | 1 refills | Status: DC

## 2016-05-13 NOTE — Telephone Encounter (Signed)
Patient notified of PCP recommendations and is agreement and expresses an understanding. Med filled to pharmacy.

## 2016-05-13 NOTE — Telephone Encounter (Signed)
Restarting Meloxicam would be reasonable at this time.  Ok to refill, #30 w/ 1

## 2016-05-13 NOTE — Telephone Encounter (Signed)
Advised patient of her lab results and referral to Pulmonology for sleep study. Patient is agreeable.  She is still having joint pains and achyness. She has tried Tylenol for the pain with no relief. She has been on Meloxicam in the past should she restart medication to help with pain?

## 2016-05-20 ENCOUNTER — Ambulatory Visit (INDEPENDENT_AMBULATORY_CARE_PROVIDER_SITE_OTHER): Payer: BLUE CROSS/BLUE SHIELD | Admitting: Podiatry

## 2016-05-20 ENCOUNTER — Ambulatory Visit (INDEPENDENT_AMBULATORY_CARE_PROVIDER_SITE_OTHER): Payer: BLUE CROSS/BLUE SHIELD

## 2016-05-20 ENCOUNTER — Encounter: Payer: Self-pay | Admitting: Podiatry

## 2016-05-20 VITALS — BP 109/69 | HR 71 | Resp 16 | Ht 66.0 in | Wt 190.0 lb

## 2016-05-20 DIAGNOSIS — M7661 Achilles tendinitis, right leg: Secondary | ICD-10-CM

## 2016-05-20 DIAGNOSIS — M79671 Pain in right foot: Secondary | ICD-10-CM

## 2016-05-20 DIAGNOSIS — M779 Enthesopathy, unspecified: Secondary | ICD-10-CM

## 2016-05-20 MED ORDER — TRIAMCINOLONE ACETONIDE 10 MG/ML IJ SUSP
10.0000 mg | Freq: Once | INTRAMUSCULAR | Status: AC
  Administered 2016-05-20: 10 mg

## 2016-05-20 NOTE — Progress Notes (Signed)
   Subjective:    Patient ID: Gwendolyn Shields, female    DOB: 05/03/1966, 50 y.o.   MRN: [id number]  HPI Chief Complaint  Patient presents with  . Foot Pain    Right foot; plantar forefoot & back of heel; pt stated, "Pain started in 2nd toe; now foot hurts"      Review of Systems  Constitutional: Positive for unexpected weight change.  Musculoskeletal: Positive for arthralgias.  All other systems reviewed and are negative.      Objective:   Physical Exam        Assessment & Plan:

## 2016-05-20 NOTE — Patient Instructions (Signed)

## 2016-05-21 ENCOUNTER — Ambulatory Visit: Payer: BLUE CROSS/BLUE SHIELD

## 2016-05-21 NOTE — Progress Notes (Signed)
Subjective:     Patient ID: Gwendolyn Shields, female   DOB: 1966/01/23, 50 y.o.   MRN: [id number]  HPI patient states that she's had a lot of pain in her forefoot right and that at times her Achilles tendon has been bothering her also   Review of Systems  All other systems reviewed and are negative.      Objective:   Physical Exam  Constitutional: She is oriented to person, place, and time.  Cardiovascular: Intact distal pulses.   Musculoskeletal: Normal range of motion.  Neurological: She is oriented to person, place, and time.  Skin: Skin is warm.  Nursing note and vitals reviewed.  neurovascular status intact with patient noted to have exquisite discomfort in the second metatarsophalangeal joint right and also noted to have moderate discomfort posterior heel with no increased warmth or swelling. Patient does not have equinus and was found to have good digital perfusion and is well oriented 3     Assessment:     Inflammatory capsulitis second MPJ right with mild to moderate Achilles tendinitis right    Plan:     H&P x-ray reviewed and recommended forefoot block with aspiration of joint explaining risk of rupture of flexor tendon. Patient wants procedure and I injected with 60 Milligan times like Marcaine mixture under sterile conditions did sterile prep of the area aspirated joint getting out a small amount of clear fluid and injected with a quarter cc dexamethasone Kenalog and applied thick plantar pad to reduce pressure on the joint. Explained range of motion exercises and stretching exercises for the Achilles along with ice and will reappoint again in the next several weeks  X-ray report was negative for significant digital elevation second digit right or indications of stress fracture arthritis with mild posterior spurring

## 2016-05-24 ENCOUNTER — Other Ambulatory Visit: Payer: Self-pay | Admitting: Obstetrics and Gynecology

## 2016-05-24 DIAGNOSIS — Z1231 Encounter for screening mammogram for malignant neoplasm of breast: Secondary | ICD-10-CM

## 2016-05-28 ENCOUNTER — Other Ambulatory Visit: Payer: Self-pay | Admitting: Family Medicine

## 2016-05-28 ENCOUNTER — Ambulatory Visit
Admission: RE | Admit: 2016-05-28 | Discharge: 2016-05-28 | Disposition: A | Discharge status: Home / Self Care | Payer: BLUE CROSS/BLUE SHIELD | Source: Ambulatory Visit | Attending: Obstetrics and Gynecology | Admitting: Obstetrics and Gynecology

## 2016-05-28 DIAGNOSIS — Z1231 Encounter for screening mammogram for malignant neoplasm of breast: Secondary | ICD-10-CM

## 2016-06-04 ENCOUNTER — Ambulatory Visit: Payer: BLUE CROSS/BLUE SHIELD | Admitting: Podiatry

## 2016-06-04 DIAGNOSIS — R635 Abnormal weight gain: Secondary | ICD-10-CM | POA: Diagnosis not present

## 2016-06-04 DIAGNOSIS — N951 Menopausal and female climacteric states: Secondary | ICD-10-CM | POA: Diagnosis not present

## 2016-06-07 ENCOUNTER — Other Ambulatory Visit: Payer: Self-pay | Admitting: Family Medicine

## 2016-06-11 DIAGNOSIS — N951 Menopausal and female climacteric states: Secondary | ICD-10-CM | POA: Diagnosis not present

## 2016-06-11 DIAGNOSIS — E538 Deficiency of other specified B group vitamins: Secondary | ICD-10-CM | POA: Diagnosis not present

## 2016-06-11 DIAGNOSIS — E669 Obesity, unspecified: Secondary | ICD-10-CM | POA: Diagnosis not present

## 2016-06-11 DIAGNOSIS — E782 Mixed hyperlipidemia: Secondary | ICD-10-CM | POA: Diagnosis not present

## 2016-06-11 DIAGNOSIS — E559 Vitamin D deficiency, unspecified: Secondary | ICD-10-CM | POA: Diagnosis not present

## 2016-06-18 DIAGNOSIS — E782 Mixed hyperlipidemia: Secondary | ICD-10-CM | POA: Diagnosis not present

## 2016-06-18 DIAGNOSIS — E039 Hypothyroidism, unspecified: Secondary | ICD-10-CM | POA: Diagnosis not present

## 2016-06-18 DIAGNOSIS — E669 Obesity, unspecified: Secondary | ICD-10-CM | POA: Diagnosis not present

## 2016-06-18 DIAGNOSIS — E538 Deficiency of other specified B group vitamins: Secondary | ICD-10-CM | POA: Diagnosis not present

## 2016-06-25 ENCOUNTER — Ambulatory Visit (INDEPENDENT_AMBULATORY_CARE_PROVIDER_SITE_OTHER): Payer: BLUE CROSS/BLUE SHIELD | Admitting: Internal Medicine

## 2016-06-25 ENCOUNTER — Encounter: Payer: Self-pay | Admitting: Internal Medicine

## 2016-06-25 VITALS — BP 120/74 | HR 73 | Ht 66.0 in | Wt 203.8 lb

## 2016-06-25 DIAGNOSIS — E782 Mixed hyperlipidemia: Secondary | ICD-10-CM | POA: Diagnosis not present

## 2016-06-25 DIAGNOSIS — G4733 Obstructive sleep apnea (adult) (pediatric): Secondary | ICD-10-CM | POA: Diagnosis not present

## 2016-06-25 DIAGNOSIS — E669 Obesity, unspecified: Secondary | ICD-10-CM | POA: Diagnosis not present

## 2016-06-25 DIAGNOSIS — E538 Deficiency of other specified B group vitamins: Secondary | ICD-10-CM | POA: Diagnosis not present

## 2016-06-25 DIAGNOSIS — G4700 Insomnia, unspecified: Secondary | ICD-10-CM | POA: Insufficient documentation

## 2016-06-25 DIAGNOSIS — F5101 Primary insomnia: Secondary | ICD-10-CM | POA: Diagnosis not present

## 2016-06-25 DIAGNOSIS — E039 Hypothyroidism, unspecified: Secondary | ICD-10-CM | POA: Diagnosis not present

## 2016-06-25 NOTE — Patient Instructions (Signed)
Order- schedule unattended home sleep test      Dx OSA  Please call as needed  We will help you schedule a return visit to follow-up

## 2016-06-25 NOTE — Assessment & Plan Note (Signed)
High probability for OSA based on history and exam. We emphasized good sleep habits, responsibility to drive safely, importance of maintaining normal body weight. Plan-schedule sleep study with office return to follow-up.

## 2016-06-25 NOTE — Assessment & Plan Note (Signed)
She describes mainly a lack of motivation to get up and put herself to bed in the evening. She sometimes uses an over-the-counter sleep aid to help with this. Plan-emphasis on good sleep habits and realistic expectations.

## 2016-06-25 NOTE — Progress Notes (Signed)
06/25/2016-50 year old never smoker referred courtesy of Dr Birdie Riddle; snoring has gotten worse-even wakes herself up. Drooling as well. No sleep study. Husband has commented on her loud snoring. Weight up 20 pounds in the past year or so. No ENT surgery. Treated for hypothyroidism. Epworth score 13/24. Has trouble motivating herself to go to bed at night and sometimes uses an over-the-counter sleep aid. Not told of other physical activity such as kicking or sleepwalking. Caffeine limited to Diet Coke. Does not drink coffee. Denies history of heart or lung disease. Some allergic rhinitis. Previous DVT without PE.  Prior to Admission medications   Medication Sig Start Date End Date Taking? Authorizing Provider  buPROPion (WELLBUTRIN XL) 300 MG 24 hr tablet TAKE 1 TABLET(300 MG) BY MOUTH DAILY 06/07/16  Yes [name], MD  butalbital-aspirin-caffeine [facility]) [id number] MG capsule TAKE 1 CAPSULE BY MOUTH THREE TIMES DAILY AS NEEDED FOR HEADACHE 05/10/16  Yes [name], MD  levothyroxine (SYNTHROID, LEVOTHROID) 112 MCG tablet TAKE 1 TABLET(112 MCG) BY MOUTH DAILY 05/28/16  Yes [name], MD  meloxicam (MOBIC) 15 MG tablet Take 1 tablet (15 mg total) by mouth daily. 05/13/16  Yes [name], MD  Multiple Vitamin (MULTIVITAMIN) tablet Take 1 tablet by mouth daily.   Yes Historical Provider, MD  norethindrone (MICRONOR,CAMILA,ERRIN) 0.35 MG tablet Take 1 tablet (0.35 mg total) by mouth daily. 04/01/16  Yes Brook E Yisroel Ramming, MD  topiramate (TOPAMAX) 100 MG tablet TAKE 3 TABLETS BY MOUTH ON MONDAY, WEDNESDAY, AND FRIDAY, AND 2 TABLETS ON ALL OTHER DAYS 06/07/16  Yes [name], MD  zolmitriptan (ZOMIG) 5 MG nasal solution Place 1 spray into the nose as needed for migraine. 11/03/15  Yes [name], MD   Past Medical History:  Diagnosis Date  . Depression   . DVT (deep venous thrombosis) ([place])   . Hyperlipidemia 08/2014  . Migraine   . STD (sexually  transmitted disease)    HSV 2  . Urinary incontinence    Past Surgical History:  Procedure Laterality Date  . BREAST BIOPSY Left 2001   benign  . CERVICAL BIOPSY  W/ LOOP ELECTRODE EXCISION     61yrs ago  . DILATION AND CURETTAGE OF UTERUS     age 27  . INTRAUTERINE DEVICE (IUD) INSERTION  07/19/2014   Mirena   Family History  Problem Relation Age of Onset  . Breast cancer Mother 76    recurence of breast cancer early 52's then developed bone cancer ate 24 /then developed bone cancer from recurence age 56  . Heart failure Father 69  . Hypertension Father   . Stroke Maternal Grandmother   . Diabetes Maternal Grandfather   . Diabetes Paternal Grandmother   . Cancer Sister 101    endometrial cancer   Social History   Social History  . Marital status: Married    Spouse name: N/A  . Number of children: N/A  . Years of education: N/A   Occupational History  . Not on file.   Social History Main Topics  . Smoking status: Never Smoker  . Smokeless tobacco: Never Used  . Alcohol use 0.0 oz/week     Comment: occ glass of wine (2-3 glasses of wine per month)  . Drug use: No  . Sexual activity: No     Comment: [employer]   Other Topics Concern  . Not on file   Social History Narrative  . No narrative on file   ROS-see HPI  Negative unless "+" Constitutional:    weight loss, night sweats, fevers, chills, fatigue, lassitude. HEENT:    +headaches, difficulty swallowing, tooth/dental problems, sore throat,       sneezing, itching, ear ache, +nasal congestion, post nasal drip, snoring CV:    chest pain, orthopnea, PND, swelling in lower extremities, anasarca,                                                         dizziness, palpitations Resp:   +shortness of breath with exertion or at rest.                productive cough,   +non-productive cough, coughing up of blood.              change in color of mucus.  wheezing.   Skin:    rash or lesions. GI:  No-   heartburn,  indigestion, abdominal pain, nausea, vomiting, diarrhea,                 change in bowel habits, loss of appetite GU: dysuria, change in color of urine, no urgency or frequency.   flank pain. MS:   joint pain, stiffness, decreased range of motion, back pain. Neuro-     nothing unusual Psych:  change in mood or affect.  depression or anxiety.   memory loss.  OBJ- Physical Exam General- Alert, Oriented, Affect-appropriate, Distress- none acute, + Overweight Skin- rash-none, lesions- none, excoriation- none Lymphadenopathy- none Head- atraumatic            Eyes- Gross vision intact, PERRLA, conjunctivae and secretions clear            Ears- Hearing, canals-normal            Nose- Clear, no-Septal dev, mucus, polyps, erosion, perforation             Throat- Mallampati III-IV , mucosa clear , drainage- none, tonsils- atrophic Neck- flexible , trachea midline, no stridor , thyroid nl, carotid no bruit Chest - symmetrical excursion , unlabored           Heart/CV- RRR , no murmur , no gallop  , no rub, nl s1 s2                           - JVD- none , edema- none, stasis changes- none, varices- none           Lung- clear to P&A, wheeze- none, cough- none , dullness-none, rub- none           Chest wall-  Abd-  Br/ Gen/ Rectal- Not done, not indicated Extrem- cyanosis- none, clubbing, none, atrophy- none, strength- nl Neuro- grossly intact to observation

## 2016-07-02 DIAGNOSIS — E538 Deficiency of other specified B group vitamins: Secondary | ICD-10-CM | POA: Diagnosis not present

## 2016-07-02 DIAGNOSIS — E669 Obesity, unspecified: Secondary | ICD-10-CM | POA: Diagnosis not present

## 2016-07-02 DIAGNOSIS — E559 Vitamin D deficiency, unspecified: Secondary | ICD-10-CM | POA: Diagnosis not present

## 2016-07-02 DIAGNOSIS — E782 Mixed hyperlipidemia: Secondary | ICD-10-CM | POA: Diagnosis not present

## 2016-07-09 ENCOUNTER — Telehealth: Payer: Self-pay | Admitting: *Deleted

## 2016-07-09 DIAGNOSIS — E039 Hypothyroidism, unspecified: Secondary | ICD-10-CM | POA: Diagnosis not present

## 2016-07-09 DIAGNOSIS — E538 Deficiency of other specified B group vitamins: Secondary | ICD-10-CM | POA: Diagnosis not present

## 2016-07-09 DIAGNOSIS — E669 Obesity, unspecified: Secondary | ICD-10-CM | POA: Diagnosis not present

## 2016-07-09 DIAGNOSIS — E782 Mixed hyperlipidemia: Secondary | ICD-10-CM | POA: Diagnosis not present

## 2016-07-09 NOTE — Telephone Encounter (Signed)
-----   Message from Deneise Lever, MD sent at 06/30/2016  8:42 PM EDT ----- Yes please. She won't like it , but best for her to schedule a split study NPSG for dx OSA. Need to bring her back after for office f/u.  ----- Message ----- From: Lorane Gell, CMA Sent: 06/30/2016   4:51 PM To: Deneise Lever, MD  CY Please advise. Thanks  ----- Message ----- From: Joellen Jersey Sent: 06/29/2016  10:53 AM To: Lorane Gell, CMA, Deneise Lever, MD  bcbs denied home sleep study do you want to set up a split night?

## 2016-07-09 NOTE — Telephone Encounter (Signed)
Spoke to pt regarding split night study request-stated she has loss weight since being seen and is worried about cost of split night study in lab. Pt would like to know if CY is agreeable to let her continue working on weight loss and be seen first of the year in follow up.    Called patient back (had to [facility]) to let her know CY is okay with her request and pt can be seen 09-14-16 in RNA slots for next OV. Pt will need to contact us sooner if she notices sleep issues return or worsen.Thanks .

## 2016-07-10 ENCOUNTER — Other Ambulatory Visit: Payer: Self-pay | Admitting: Family Medicine

## 2016-07-19 DIAGNOSIS — E559 Vitamin D deficiency, unspecified: Secondary | ICD-10-CM | POA: Diagnosis not present

## 2016-07-19 DIAGNOSIS — E538 Deficiency of other specified B group vitamins: Secondary | ICD-10-CM | POA: Diagnosis not present

## 2016-07-19 DIAGNOSIS — E782 Mixed hyperlipidemia: Secondary | ICD-10-CM | POA: Diagnosis not present

## 2016-07-19 DIAGNOSIS — N951 Menopausal and female climacteric states: Secondary | ICD-10-CM | POA: Diagnosis not present

## 2016-07-19 DIAGNOSIS — E669 Obesity, unspecified: Secondary | ICD-10-CM | POA: Diagnosis not present

## 2016-08-02 DIAGNOSIS — E782 Mixed hyperlipidemia: Secondary | ICD-10-CM | POA: Diagnosis not present

## 2016-08-02 DIAGNOSIS — E039 Hypothyroidism, unspecified: Secondary | ICD-10-CM | POA: Diagnosis not present

## 2016-08-02 DIAGNOSIS — E538 Deficiency of other specified B group vitamins: Secondary | ICD-10-CM | POA: Diagnosis not present

## 2016-08-02 DIAGNOSIS — E669 Obesity, unspecified: Secondary | ICD-10-CM | POA: Diagnosis not present

## 2016-08-13 DIAGNOSIS — E039 Hypothyroidism, unspecified: Secondary | ICD-10-CM | POA: Diagnosis not present

## 2016-08-13 DIAGNOSIS — E669 Obesity, unspecified: Secondary | ICD-10-CM | POA: Diagnosis not present

## 2016-08-13 DIAGNOSIS — E782 Mixed hyperlipidemia: Secondary | ICD-10-CM | POA: Diagnosis not present

## 2016-08-13 DIAGNOSIS — E538 Deficiency of other specified B group vitamins: Secondary | ICD-10-CM | POA: Diagnosis not present

## 2016-08-13 DIAGNOSIS — N951 Menopausal and female climacteric states: Secondary | ICD-10-CM | POA: Diagnosis not present

## 2016-08-16 ENCOUNTER — Other Ambulatory Visit: Payer: Self-pay | Admitting: Family Medicine

## 2016-08-25 ENCOUNTER — Other Ambulatory Visit: Payer: Self-pay | Admitting: Family Medicine

## 2016-08-26 NOTE — Telephone Encounter (Signed)
Last OV 05/10/16 topamax last filled 07/12/16 #90 with 0

## 2016-08-30 DIAGNOSIS — N951 Menopausal and female climacteric states: Secondary | ICD-10-CM | POA: Diagnosis not present

## 2016-08-30 DIAGNOSIS — E039 Hypothyroidism, unspecified: Secondary | ICD-10-CM | POA: Diagnosis not present

## 2016-08-30 DIAGNOSIS — E669 Obesity, unspecified: Secondary | ICD-10-CM | POA: Diagnosis not present

## 2016-08-30 DIAGNOSIS — E538 Deficiency of other specified B group vitamins: Secondary | ICD-10-CM | POA: Diagnosis not present

## 2016-12-08 ENCOUNTER — Other Ambulatory Visit: Payer: Self-pay | Admitting: Family Medicine

## 2017-01-14 ENCOUNTER — Ambulatory Visit (INDEPENDENT_AMBULATORY_CARE_PROVIDER_SITE_OTHER): Payer: BLUE CROSS/BLUE SHIELD | Admitting: Obstetrics and Gynecology

## 2017-01-14 ENCOUNTER — Encounter: Payer: Self-pay | Admitting: Obstetrics and Gynecology

## 2017-01-14 VITALS — BP 108/64 | HR 70 | Ht 66.0 in | Wt 197.0 lb

## 2017-01-14 DIAGNOSIS — N393 Stress incontinence (female) (male): Secondary | ICD-10-CM

## 2017-01-14 DIAGNOSIS — R8290 Unspecified abnormal findings in urine: Secondary | ICD-10-CM

## 2017-01-14 DIAGNOSIS — N912 Amenorrhea, unspecified: Secondary | ICD-10-CM | POA: Diagnosis not present

## 2017-01-14 DIAGNOSIS — R3915 Urgency of urination: Secondary | ICD-10-CM | POA: Diagnosis not present

## 2017-01-14 LAB — POCT URINALYSIS DIPSTICK
BILIRUBIN UA: NEGATIVE
GLUCOSE UA: NEGATIVE
KETONES UA: NEGATIVE
NITRITE UA: NEGATIVE
Protein, UA: NEGATIVE
Urobilinogen, UA: 0.2 E.U./dL
pH, UA: 5 (ref 5.0–8.0)

## 2017-01-14 MED ORDER — SULFAMETHOXAZOLE-TRIMETHOPRIM 800-160 MG PO TABS: 1.0000 | ORAL_TABLET | Freq: Two times a day (BID) | ORAL | 0 refills | Status: DC

## 2017-01-14 NOTE — Progress Notes (Signed)
GYNECOLOGY  VISIT   HPI: 51 y.o.   Married  Caucasian  female   [phone] with Patient's last menstrual period was 06/30/2015 (approximate).   here for possible vaginal prolapse. Patient complaining of urinary urgency but not emptying well.   Voiding a lot and having a lot of pressure for 2 weeks. No dysuria.  No itching or burning.  No hematuria.   Vaginal dryness.   Still with stress incontinence.  Thinking about surgical treatment.   No prior UTIs. No pyelonephritis.  [place] 53.6 and E2 74 in July 2017.   Going to [place] in July this year.   Urine Dip: 1+WBC, 1+RBCs  GYNECOLOGIC HISTORY: Patient's last menstrual period was 06/30/2015 (approximate). Contraception: none.  Menopausal hormone therapy: none Last mammogram: 05-28-16 Density B/Neg/BiRads1:TBC Last pap smear:  03-11-15 Neg:Neg HR HPV; 12-15-12 Neg:Neg HR HPV        OB History    Gravida Para Term Preterm AB Living   4 2 2   2 2    SAB TAB Ectopic Multiple Live Births   2       2         Patient Active Problem List   Diagnosis Date Noted  . Obstructive sleep apnea 06/25/2016  . Insomnia 06/25/2016  . Hyperlipidemia 03/21/2015  . Allergic rhinitis 10/08/2014  . Medication side effect 10/08/2014  . Preventative health care 08/13/2014  . Hypothyroidism 04/19/2014  . Weight gain 01/17/2014  . Other malaise and fatigue 01/17/2014  . Depression 01/17/2014  . DVT (deep venous thrombosis) ([place]) 11/29/2013  . Osteoarthritis of [place] joint of thumb 11/28/2013  . Pain of right lower leg 11/28/2013  . Routine general medical examination at a health care facility 07/20/2013  . Fungal dermatitis 07/20/2013  . Shoulder blade pain 07/20/2013  . CERVICAL CANCER 05/21/2010  . ASTHMA, UNSPECIFIED 05/21/2010  . DYSPNEA 05/21/2010  . Migraine 05/21/2010    Past Medical History:  Diagnosis Date  . Depression   . DVT (deep venous thrombosis) ([place])   . Hyperlipidemia 08/2014  . Migraine   . STD (sexually transmitted  disease)    HSV 2  . Urinary incontinence     Past Surgical History:  Procedure Laterality Date  . BREAST BIOPSY Left 2001   benign  . CERVICAL BIOPSY  W/ LOOP ELECTRODE EXCISION     3yrs ago  . DILATION AND CURETTAGE OF UTERUS     age 63  . INTRAUTERINE DEVICE (IUD) INSERTION  07/19/2014   Mirena    Current Outpatient Prescriptions  Medication Sig Dispense Refill  . buPROPion (WELLBUTRIN XL) 300 MG 24 hr tablet TAKE 1 TABLET(300 MG) BY MOUTH DAILY 90 tablet 0  . butalbital-aspirin-caffeine (FIORINAL) 50-325-40 MG capsule TAKE 1 CAPSULE BY MOUTH THREE TIMES DAILY AS NEEDED FOR HEADACHE 45 capsule 0  . levothyroxine (SYNTHROID, LEVOTHROID) 112 MCG tablet TAKE 1 TABLET(112 MCG) BY MOUTH DAILY 30 tablet 0  . Multiple Vitamin (MULTIVITAMIN) tablet Take 1 tablet by mouth daily.    Marland Kitchen topiramate (TOPAMAX) 100 MG tablet TAKE 3 TABLETS BY MOUTH ON MONDAY, WEDNESDAY, AND FRIDAY, AND 2 TABLETS ON ALL OTHER DAYS 90 tablet 3  . zolmitriptan (ZOMIG) 5 MG nasal solution Place 1 spray into the nose as needed for migraine. 6 Units 6   No current facility-administered medications for this visit.      ALLERGIES: Lipitor [atorvastatin]  Family History  Problem Relation Age of Onset  . Breast cancer Mother 57    recurence of breast  cancer early 59's then developed bone cancer ate 2 /then developed bone cancer from recurence age 60  . Heart failure Father 29  . Hypertension Father   . Stroke Maternal Grandmother   . Diabetes Maternal Grandfather   . Diabetes Paternal Grandmother   . Cancer Sister 33    endometrial cancer    Social History   Social History  . Marital status: Married    Spouse name: N/A  . Number of children: N/A  . Years of education: N/A   Occupational History  . Not on file.   Social History Main Topics  . Smoking status: Never Smoker  . Smokeless tobacco: Never Used  . Alcohol use 0.0 oz/week     Comment: occ glass of wine (2-3 glasses of wine per month)  .  Drug use: No  . Sexual activity: No   Other Topics Concern  . Not on file   Social History Narrative  . No narrative on file    ROS:  Pertinent items are noted in HPI.  PHYSICAL EXAMINATION:    BP 108/64 (BP Location: Left Arm, Patient Position: Sitting, Cuff Size: Large)   Pulse 70   Ht 5\' 6"  (1.676 m)   Wt 197 lb (89.4 kg)   LMP 06/30/2015 (Approximate)   BMI 31.80 kg/m     General appearance: alert, cooperative and appears stated age   Abdomen: soft, non-tender, no masses,  no organomegaly   Pelvic: External genitalia:  no lesions              Urethra:  normal appearing urethra with no masses, tenderness or lesions              Bartholins and Skenes: normal                 Vagina: normal appearing vagina with normal color and discharge, no lesions              Cervix: no lesions                Bimanual Exam:  Uterus:  normal size, contour, position, consistency, mobility, non-tender              Adnexa: no mass, fullness, tenderness                Chaperone was present for exam.  ASSESSMENT  Pelvic pressure.  Abnormal urine.  Stress incontinence. Atrophic symptoms.  Amenorrhea.  Perimenopausal with last [place] and estradiol. Hx DVT.   PLAN  Urine micro and culture.  Bactrim DS po bid for 3 days.  FSH and estradiol. Discussed cooking oils for hydration of the vagina.  She is not a candidate for vaginal estrogens.  Josph Macho Touch may be an option as well. We talked about midurethral sling and the recovery needed for this.  I would recommend considering this after her upcoming travel but not before due to lifting restrictions.  Follow up for annual exam and prn.  An After Visit Summary was printed and given to the patient.  __25____ minutes face to face time of which over 50% was spent in counseling.

## 2017-01-15 LAB — URINALYSIS, MICROSCOPIC ONLY
BACTERIA UA: NONE SEEN [HPF]
Casts: NONE SEEN [LPF]
YEAST: NONE SEEN [HPF]

## 2017-01-15 LAB — FOLLICLE STIMULATING HORMONE: FSH: 100.1 m[IU]/mL

## 2017-01-15 LAB — ESTRADIOL: ESTRADIOL: 23 pg/mL

## 2017-01-16 LAB — URINE CULTURE

## 2017-01-17 ENCOUNTER — Telehealth: Payer: Self-pay

## 2017-01-17 NOTE — Telephone Encounter (Signed)
-----   Message from Nunzio Cobbs, MD sent at 01/17/2017 10:23 AM EDT ----- Please contact patient in follow up to her visit with me.  Her final urine culture was negative for infection so she can stop the antibiotics if she has not finished already. The microscopic test showed white blood cells and crystals.  The crystals can be associated with kidney stones.  She needs to hydrate really well.  I would like to have her return for an appointment for a recheck at the end of the week.  If she is having progressive urinary pressure/pain, I may need to send her to urology to rule out stones.   Her hormonal testing confirms she is in menopause!

## 2017-01-17 NOTE — Telephone Encounter (Signed)
Spoke with patient. Advised of message as seen below from [place]. Patient verbalizes understanding. Would like to push fluids and return for urine recheck. States she is still having urinary urgency and slight pressure, but that it is not as bad as last week. Will monitor and if symptoms change she will contact the office so appointment can be made with urology. Urine recheck scheduled for 01/21/2017 at 10 am. Patient is agreeable to date and time.  Routing to provider for final review. Patient agreeable to disposition. Will close encounter.

## 2017-01-20 ENCOUNTER — Telehealth: Payer: Self-pay | Admitting: Podiatry

## 2017-01-20 NOTE — Telephone Encounter (Signed)
Returned call from pts vm she left and had to leave a message for pt to call to schedule an appt with Dr R.

## 2017-01-21 ENCOUNTER — Ambulatory Visit (INDEPENDENT_AMBULATORY_CARE_PROVIDER_SITE_OTHER): Payer: BLUE CROSS/BLUE SHIELD

## 2017-01-21 VITALS — BP 110/70 | HR 64 | Resp 16 | Ht 66.0 in | Wt 201.2 lb

## 2017-01-21 DIAGNOSIS — R8290 Unspecified abnormal findings in urine: Secondary | ICD-10-CM | POA: Diagnosis not present

## 2017-01-21 NOTE — Progress Notes (Signed)
Patient here for repeat urine microscope. Patient had urine micro on 01/14/17 and results showed + WBC and crystals.

## 2017-01-22 LAB — URINALYSIS, MICROSCOPIC ONLY
BACTERIA UA: NONE SEEN [HPF]
CRYSTALS: NONE SEEN [HPF]
Casts: NONE SEEN [LPF]
RBC / HPF: NONE SEEN RBC/HPF (ref <=2)
Squamous Epithelial / LPF: NONE SEEN [HPF] (ref <=5)
WBC UA: NONE SEEN WBC/HPF (ref <=5)
YEAST: NONE SEEN [HPF]

## 2017-01-23 ENCOUNTER — Other Ambulatory Visit: Payer: Self-pay | Admitting: Family Medicine

## 2017-01-24 ENCOUNTER — Encounter: Payer: Self-pay | Admitting: General Practice

## 2017-01-24 ENCOUNTER — Other Ambulatory Visit: Payer: Self-pay | Admitting: Family Medicine

## 2017-01-25 ENCOUNTER — Telehealth: Payer: Self-pay | Admitting: Obstetrics and Gynecology

## 2017-01-25 NOTE — Telephone Encounter (Signed)
Spoke with patient. Advised of message as seen below from [place]. Patient verbalizes understanding. States she is still having urinary pressure and urgency. Denies any pain with urination, lower back pain, fever, or chills. Asking if Dr.Silva feels this is related to bladder dropping due to age since her testing returned normal. Advised will speak with Dr.Silva and return call. Patient is agreeable.

## 2017-01-25 NOTE — Telephone Encounter (Signed)
Left message to call [place] at [phone].  Notes recorded by Nunzio Cobbs, MD on 01/24/2017 at 8:27 AM EDT Results to patient through My Chart.  Hi [place],   Your repeat urine microscopic exam is completely normal.   Please contact the office for any questions.   Josefa Half, MD

## 2017-01-25 NOTE — Telephone Encounter (Signed)
Patient called requesting results from 01/21/17 for a urine micro test.

## 2017-01-26 NOTE — Telephone Encounter (Signed)
Spoke with patient. Advised of message as seen below from [place]. Patient verbalizes understanding. Appointment scheduled for 02/10/2017 at 1 pm with Dr.Silva. Patient is agreeable to date and time.  Routing to provider for final review. Patient agreeable to disposition. Will close encounter.

## 2017-01-26 NOTE — Telephone Encounter (Signed)
It may be helpful for the patient to return for re-evaluation.  I would like to check a post void residual in the office.  If this is normal, I would recommend a possible pelvic ultrasound to look at her reproductive anatomy.

## 2017-02-04 ENCOUNTER — Encounter: Payer: Self-pay | Admitting: Family Medicine

## 2017-02-04 ENCOUNTER — Ambulatory Visit (INDEPENDENT_AMBULATORY_CARE_PROVIDER_SITE_OTHER): Payer: BLUE CROSS/BLUE SHIELD | Admitting: Family Medicine

## 2017-02-04 VITALS — BP 116/80 | HR 78 | Temp 99.1°F | Resp 16 | Ht 66.0 in | Wt 202.0 lb

## 2017-02-04 DIAGNOSIS — F32A Depression, unspecified: Secondary | ICD-10-CM

## 2017-02-04 DIAGNOSIS — E785 Hyperlipidemia, unspecified: Secondary | ICD-10-CM | POA: Diagnosis not present

## 2017-02-04 DIAGNOSIS — F329 Major depressive disorder, single episode, unspecified: Secondary | ICD-10-CM

## 2017-02-04 DIAGNOSIS — E038 Other specified hypothyroidism: Secondary | ICD-10-CM | POA: Diagnosis not present

## 2017-02-04 LAB — CBC WITH DIFFERENTIAL/PLATELET
BASOS ABS: 0 10*3/uL (ref 0.0–0.1)
Basophils Relative: 0.5 % (ref 0.0–3.0)
EOS ABS: 0.1 10*3/uL (ref 0.0–0.7)
EOS PCT: 1.9 % (ref 0.0–5.0)
HCT: 40.4 % (ref 36.0–46.0)
Hemoglobin: 13.3 g/dL (ref 12.0–15.0)
Lymphocytes Relative: 22.4 % (ref 12.0–46.0)
Lymphs Abs: 1.7 10*3/uL (ref 0.7–4.0)
MCHC: 33 g/dL (ref 30.0–36.0)
MCV: 92.1 fl (ref 78.0–100.0)
MONO ABS: 0.5 10*3/uL (ref 0.1–1.0)
Monocytes Relative: 6.5 % (ref 3.0–12.0)
NEUTROS PCT: 68.7 % (ref 43.0–77.0)
Neutro Abs: 5.3 10*3/uL (ref 1.4–7.7)
Platelets: 281 10*3/uL (ref 150.0–400.0)
RBC: 4.39 Mil/uL (ref 3.87–5.11)
RDW: 13.3 % (ref 11.5–15.5)
WBC: 7.7 10*3/uL (ref 4.0–10.5)

## 2017-02-04 LAB — BASIC METABOLIC PANEL
BUN: 14 mg/dL (ref 6–23)
CHLORIDE: 109 meq/L (ref 96–112)
CO2: 28 mEq/L (ref 19–32)
CREATININE: 1 mg/dL (ref 0.40–1.20)
Calcium: 9.4 mg/dL (ref 8.4–10.5)
GFR: 61.98 mL/min (ref >60.00)
GLUCOSE: 71 mg/dL (ref 70–99)
Potassium: 4.5 mEq/L (ref 3.5–5.1)
Sodium: 142 mEq/L (ref 135–145)

## 2017-02-04 LAB — LIPID PANEL
CHOL/HDL RATIO: 3
Cholesterol: 237 mg/dL — ABNORMAL HIGH (ref 0–200)
HDL: 71.2 mg/dL (ref >39.00)
LDL Cholesterol: 150 mg/dL — ABNORMAL HIGH (ref 0–99)
NonHDL: 165.42
TRIGLYCERIDES: 75 mg/dL (ref 0.0–149.0)
VLDL: 15 mg/dL (ref 0.0–40.0)

## 2017-02-04 LAB — HEPATIC FUNCTION PANEL
ALBUMIN: 4.2 g/dL (ref 3.5–5.2)
ALT: 17 U/L (ref 0–35)
AST: 17 U/L (ref 0–37)
Alkaline Phosphatase: 79 U/L (ref 39–117)
BILIRUBIN TOTAL: 0.3 mg/dL (ref 0.2–1.2)
Bilirubin, Direct: 0.1 mg/dL (ref 0.0–0.3)
TOTAL PROTEIN: 6.5 g/dL (ref 6.0–8.3)

## 2017-02-04 LAB — TSH: TSH: 2.73 u[IU]/mL (ref 0.35–4.50)

## 2017-02-04 MED ORDER — SERTRALINE HCL 50 MG PO TABS: 50.0000 mg | ORAL_TABLET | Freq: Every day | ORAL | 3 refills | Status: DC

## 2017-02-04 NOTE — Progress Notes (Signed)
   Subjective:    Patient ID: Gwendolyn Shields, female    DOB: 21-May-1966, 51 y.o.   MRN: [id number]  HPI Hypothyroid- chronic problem, on Levothyroxine 197mcg daily.  Denies excessive fatigue, changes to skin/hair/nails.  Hyperlipidemia- pt has hx of this.  Has not had lipids in nearly 2 yrs.  Pt remains obese.  No CP, SOB, visual changes, edema, abd pain.  Anxiety- chronic problem, on Bupropion 300mg  daily.  Pt reports stress levels are very high w/ daughter's upcoming graduation and daughter's increased stress.  Pt feels Wellbutrin is no longer effective in controlling her sxs.  Notes heart will race at night, poor sleep.  Stress eating.  Pt is withdrawing from friends, cancelling plans.  Decreased motivation.   Review of Systems For ROS see HPI     Objective:   Physical Exam  Constitutional: She is oriented to person, place, and time. She appears well-developed and well-nourished. No distress.  obese  HENT:  Head: Normocephalic and atraumatic.  Eyes: Conjunctivae and EOM are normal. Pupils are equal, round, and reactive to light.  Neck: Normal range of motion. Neck supple. No thyromegaly present.  Cardiovascular: Normal rate, regular rhythm, normal heart sounds and intact distal pulses.   No murmur heard. Pulmonary/Chest: Effort normal and breath sounds normal. No respiratory distress.  Abdominal: Soft. She exhibits no distension. There is no tenderness.  Musculoskeletal: She exhibits no edema.  Lymphadenopathy:    She has no cervical adenopathy.  Neurological: She is alert and oriented to person, place, and time.  Skin: Skin is warm and dry.  Psychiatric: She has a normal mood and affect. Her behavior is normal.  Vitals reviewed.         Assessment & Plan:

## 2017-02-04 NOTE — Assessment & Plan Note (Signed)
Chronic problem.  Having some palpitations at night but I suspect this is anxiety related.  Check labs.  Adjust meds prn.  Will follow.

## 2017-02-04 NOTE — Patient Instructions (Signed)
Follow up in 1 month to recheck anxiety/depression We'll notify you of your lab results and make any changes if needed Continue the Wellbutrin daily ADD the Sertraline once daily Try and work on stress management- art, music, exercise, etc are all good options Call with any questions or concerns Enjoy graduation!!!

## 2017-02-04 NOTE — Progress Notes (Signed)
Pre visit review using our clinic review tool, if applicable. No additional management support is needed unless otherwise documented below in the visit note. 

## 2017-02-04 NOTE — Assessment & Plan Note (Signed)
Deteriorated.  Pt reports increased depression and anxiety.  Doesn't feel Wellbutrin is currently effective in controlling her sxs.  Add Sertraline as pt has previously been on Celexa and Prozac w/o improvement in sxs.  Encouraged stress management.  Will continue to follow closely.

## 2017-02-04 NOTE — Assessment & Plan Note (Signed)
Pt has hx of this but has not had recent labs.  Will repeat labs today and start meds if needed.

## 2017-02-08 ENCOUNTER — Ambulatory Visit: Payer: BLUE CROSS/BLUE SHIELD | Admitting: Podiatry

## 2017-02-08 NOTE — Progress Notes (Signed)
Called pt and lmovm to return call.

## 2017-02-09 ENCOUNTER — Telehealth: Payer: Self-pay | Admitting: Obstetrics and Gynecology

## 2017-02-09 ENCOUNTER — Other Ambulatory Visit: Payer: Self-pay | Admitting: Family Medicine

## 2017-02-09 DIAGNOSIS — E785 Hyperlipidemia, unspecified: Secondary | ICD-10-CM

## 2017-02-09 NOTE — Telephone Encounter (Signed)
Patient cancelled her recheck appointment tomorrow and will call back to reschedule.

## 2017-02-09 NOTE — Telephone Encounter (Signed)
Chart reviewed.  I closed the encounter. Ok to wait for her to call back.

## 2017-02-10 ENCOUNTER — Ambulatory Visit: Payer: BLUE CROSS/BLUE SHIELD | Admitting: Obstetrics and Gynecology

## 2017-02-14 ENCOUNTER — Ambulatory Visit (INDEPENDENT_AMBULATORY_CARE_PROVIDER_SITE_OTHER): Payer: BLUE CROSS/BLUE SHIELD | Admitting: Podiatry

## 2017-02-14 ENCOUNTER — Encounter: Payer: Self-pay | Admitting: Podiatry

## 2017-02-14 ENCOUNTER — Ambulatory Visit (INDEPENDENT_AMBULATORY_CARE_PROVIDER_SITE_OTHER): Payer: BLUE CROSS/BLUE SHIELD

## 2017-02-14 DIAGNOSIS — M7661 Achilles tendinitis, right leg: Secondary | ICD-10-CM

## 2017-02-14 DIAGNOSIS — M79671 Pain in right foot: Secondary | ICD-10-CM | POA: Diagnosis not present

## 2017-02-14 DIAGNOSIS — Q828 Other specified congenital malformations of skin: Secondary | ICD-10-CM | POA: Diagnosis not present

## 2017-02-14 DIAGNOSIS — M779 Enthesopathy, unspecified: Secondary | ICD-10-CM

## 2017-02-14 MED ORDER — TRIAMCINOLONE ACETONIDE 10 MG/ML IJ SUSP
10.0000 mg | Freq: Once | INTRAMUSCULAR | Status: AC
  Administered 2017-02-14: 10 mg

## 2017-02-14 NOTE — Progress Notes (Signed)
Subjective:    Patient ID: Gwendolyn Shields, female   DOB: 50 y.o.   MRN: [id number]   HPI patient states that the right forefoot has been quite painful and the heel has been hurting her quite a bit along with a lesion on the big toe. Patient is leaving for [place] next month and wants to get this worked on    ROS      Objective:  Physical Exam neurovascular status intact negative Homans sign was noted with patient found to have acute inflammation that's returned in the second MPJ right with fluid buildup and pain in the posterior lateral aspect of the right heel at the insertion of the tendon into the calcaneus     Assessment:   Acute capsulitis second MPJ right with inflammation fluid buildup and discomfort in the lateral side posterior heel at the insertion into the calcaneus along with porokeratotic lesion right hallux      Plan:   H&P x-rays reviewed conditions discussed. I discussed the risk inserted injections and patient wants both these areas treated due to pain and today I did a careful lateral injection posterior calcaneus lateral side 3 mg Dexon some Kenalog 5 mg Xylocaine and injected the second MPJ after proximal nerve block with a quarter cc deck some some Kenalog after draining the joint with 10 mL syringe and getting out a clear fluid. Debris did lesion right and reappoint to recheck  X-ray indicates there is small posterior spur and no indication of arthritis around the second MPJ

## 2017-02-16 ENCOUNTER — Other Ambulatory Visit: Payer: Self-pay | Admitting: Obstetrics and Gynecology

## 2017-02-16 MED ORDER — VALACYCLOVIR HCL 500 MG PO TABS: 500.0000 mg | ORAL_TABLET | Freq: Two times a day (BID) | ORAL | 1 refills | Status: DC

## 2017-02-16 NOTE — Telephone Encounter (Signed)
Medication refill request: Valtrex  Last AEX:  03-12-16  Next AEX: 05-02-17  Last MMG (if hormonal medication request): [id number]  Refill authorized: please advise

## 2017-02-16 NOTE — Telephone Encounter (Signed)
Patient requesting refill of her herpes medication but does not remember the name of it.  States it has been years since she had it filled.

## 2017-02-21 ENCOUNTER — Other Ambulatory Visit: Payer: Self-pay | Admitting: Family Medicine

## 2017-02-21 NOTE — Telephone Encounter (Signed)
Last OV 02/04/17 Fiorinal last filled 05/10/16 #45 with 0

## 2017-02-21 NOTE — Telephone Encounter (Signed)
Medication filled to pharmacy as requested.   

## 2017-02-27 ENCOUNTER — Other Ambulatory Visit: Payer: Self-pay | Admitting: Family Medicine

## 2017-03-08 ENCOUNTER — Other Ambulatory Visit: Payer: Self-pay | Admitting: Family Medicine

## 2017-03-11 ENCOUNTER — Ambulatory Visit (INDEPENDENT_AMBULATORY_CARE_PROVIDER_SITE_OTHER): Payer: BLUE CROSS/BLUE SHIELD | Admitting: Family Medicine

## 2017-03-11 ENCOUNTER — Ambulatory Visit (INDEPENDENT_AMBULATORY_CARE_PROVIDER_SITE_OTHER): Payer: BLUE CROSS/BLUE SHIELD | Admitting: Podiatry

## 2017-03-11 ENCOUNTER — Encounter: Payer: Self-pay | Admitting: Podiatry

## 2017-03-11 ENCOUNTER — Encounter: Payer: Self-pay | Admitting: Family Medicine

## 2017-03-11 VITALS — BP 114/78 | HR 73 | Temp 98.0°F | Resp 16 | Ht 66.0 in | Wt 205.2 lb

## 2017-03-11 DIAGNOSIS — M779 Enthesopathy, unspecified: Secondary | ICD-10-CM | POA: Diagnosis not present

## 2017-03-11 DIAGNOSIS — F329 Major depressive disorder, single episode, unspecified: Secondary | ICD-10-CM | POA: Diagnosis not present

## 2017-03-11 DIAGNOSIS — F32A Depression, unspecified: Secondary | ICD-10-CM

## 2017-03-11 DIAGNOSIS — G43009 Migraine without aura, not intractable, without status migrainosus: Secondary | ICD-10-CM | POA: Diagnosis not present

## 2017-03-11 DIAGNOSIS — M7661 Achilles tendinitis, right leg: Secondary | ICD-10-CM

## 2017-03-11 MED ORDER — PROPRANOLOL HCL ER 60 MG PO CP24: 60.0000 mg | ORAL_CAPSULE | Freq: Every day | ORAL | 3 refills | Status: DC

## 2017-03-11 MED ORDER — DICLOFENAC SODIUM 75 MG PO TBEC: 75.0000 mg | DELAYED_RELEASE_TABLET | Freq: Two times a day (BID) | ORAL | 2 refills | Status: DC

## 2017-03-11 NOTE — Patient Instructions (Signed)
Follow up ~3-4 weeks after you change your meds Continue the Wellbutrin and the Sertraline daily for the anxiety- I'm SO glad that you're feeling better When you get back from vacation, decrease the Topamax to 200mg  daily x2 weeks and then change to 200mg  M/W/F and 100mg  the other days START the Propranolol once daily Add a daily OTC Magnesium supplement Drink plenty of fluids Call with any questions or concerns Have a great trip!!!

## 2017-03-11 NOTE — Assessment & Plan Note (Signed)
Much improved since adding Zoloft to Wellbutrin.  No changes at this time.  Will follow.

## 2017-03-11 NOTE — Progress Notes (Signed)
   Subjective:    Patient ID: Gwendolyn Shields, female    DOB: Nov 24, 1965, 51 y.o.   MRN: [id number]  HPI Depression/anxiety- pt was started on Zoloft at last visit in addition to her Wellbutrin.  Since the last visit, pt survived daughter's graduation and party.  Feels everything went very well.  Feels good since adding the Zoloft.  Feeling less wound, less tearful, less irritable.    HAs- pt does not feel migraines are well controlled.  She is not happy with the cognitive side effects of Topamax.  Is asking about switching to Zonisamide.   Review of Systems For ROS see HPI     Objective:   Physical Exam  Constitutional: She is oriented to person, place, and time. She appears well-developed and well-nourished. No distress.  obese  HENT:  Head: Normocephalic and atraumatic.  Eyes: Conjunctivae and EOM are normal. Pupils are equal, round, and reactive to light.  Neurological: She is alert and oriented to person, place, and time.  Skin: Skin is warm and dry.  Psychiatric: She has a normal mood and affect. Her behavior is normal. Thought content normal.  Vitals reviewed.         Assessment & Plan:

## 2017-03-11 NOTE — Progress Notes (Signed)
Subjective:    Patient ID: Gwendolyn Shields, female   DOB: 51 y.o.   MRN: [id number]   HPI patient states the posterior heel is still bothering me some and the forefoot seems to be somewhat better. Patient is getting ready to go on her trip the next few days and states she still having moderate discomfort    ROS      Objective:  Physical Exam neurovascular status intact with patient found to have moderate discomfort posterior aspect right heel lateral and central portion of the tendon with diminished calor noted and is noted to have significant diminishment of pain forefoot     Assessment:    Achilles tendinitis with mild improvement but still present along with inflammatory capsulitis right still present but significantly improved     Plan:    At this point we'll start her on diclofenac 75 mg twice a day and she is dispensed a silicone type dressing to cover the posterior heel. I then went ahead and I advised on aggressive ice and we discussed long-term orthotics which we'll do when she gets home from her trip

## 2017-03-11 NOTE — Progress Notes (Signed)
Pre visit review using our clinic review tool, if applicable. No additional management support is needed unless otherwise documented below in the visit note. 

## 2017-03-11 NOTE — Assessment & Plan Note (Signed)
Pt is not able to tolerate the topamax at this time due to the cognitive issues.  She is fearful of breakthrough headaches and wants to consider other options.  She has never been on beta blocker.  Was considering Zonisamid but I have never used this as prophylaxis and would prefer to start low dose beta blocker.  Will need to wean off Topamax as pt has been on this for years.  Weaning schedule outlined for pt.  Will follow closely.

## 2017-03-14 ENCOUNTER — Telehealth: Payer: Self-pay | Admitting: Family Medicine

## 2017-03-14 MED ORDER — ZOLMITRIPTAN 5 MG NA SOLN: 1.0000 | NASAL | 6 refills | Status: DC | PRN

## 2017-03-14 NOTE — Telephone Encounter (Signed)
Medication filled to pharmacy as requested.   

## 2017-03-14 NOTE — Telephone Encounter (Signed)
Pt asking for a refill on zomig, pt states that her Rx has expired and that they are leaving to go on a 2 week trip today at 11:00 and asking that this be called in asap. [facility] in [place].

## 2017-03-18 ENCOUNTER — Ambulatory Visit: Payer: BLUE CROSS/BLUE SHIELD | Admitting: Obstetrics and Gynecology

## 2017-04-02 ENCOUNTER — Other Ambulatory Visit: Payer: Self-pay | Admitting: Family Medicine

## 2017-04-06 ENCOUNTER — Telehealth: Payer: Self-pay | Admitting: Family Medicine

## 2017-04-06 NOTE — Telephone Encounter (Signed)
Patient notified of PCP recommendations and is agreement and expresses an understanding.  

## 2017-04-06 NOTE — Telephone Encounter (Signed)
This is not typically a medication issue and sounds more like IBS.  Is she under more stress recently?  Would recommend bland diet, increase fiber slowly, add daily probiotic and if no improvement, will need OV

## 2017-04-06 NOTE — Telephone Encounter (Signed)
Has noticed that she has stomach pain every time she eats and diarrhea. Is unsure if this is from tapering topamax or starting propranolol. Please call to discuss.

## 2017-04-06 NOTE — Telephone Encounter (Signed)
Please advise 

## 2017-04-29 NOTE — Progress Notes (Signed)
51 y.o. [id number] Married Caucasian female here for annual exam.     Some vaginal dryness.  No hot flashes.   Had [place] of 100.1 on 01/14/17.   Went to [place] with her family.   Bladder functioning is normal now.  Not feeling so much urgency.  Emptying bladder well.   Work starts Architectural technologist.   Labs with PCP.   PCP:   Dr.Tabori  Patient's last menstrual period was 08/13/2016 (approximate).           Sexually active: Yes.    The current method of family planning is none.    Exercising: No.   Smoker:  no  Health Maintenance: Pap: 03-11-15 Neg:Neg HR HPV; 12-15-12 Neg:Neg HR HPV History of abnormal Pap:  Yes,  Hx of LEEP 1997. Paps normal since [facility]G: 05-28-16 Density B/Neg/BiRads1:TBC Colonoscopy: NEVER BMD:   n/a  Result  n/a TDaP:  2014 Gardasil:   no MVE:HMCNOB Hep C:unsure Screening Labs:  Hb today: PCP, Urine today: not done   reports that she has never smoked. She has never used smokeless tobacco. She reports that she drinks alcohol. She reports that she does not use drugs.  Past Medical History:  Diagnosis Date  . Depression   . DVT (deep venous thrombosis) ([place])   . Hyperlipidemia 08/2014  . Migraine   . STD (sexually transmitted disease)    HSV 2  . Urinary incontinence     Past Surgical History:  Procedure Laterality Date  . BREAST BIOPSY Left 2001   benign  . CERVICAL BIOPSY  W/ LOOP ELECTRODE EXCISION     25yrs ago  . DILATION AND CURETTAGE OF UTERUS     age 4  . INTRAUTERINE DEVICE (IUD) INSERTION  07/19/2014   Mirena    Current Outpatient Prescriptions  Medication Sig Dispense Refill  . buPROPion (WELLBUTRIN XL) 300 MG 24 hr tablet TAKE 1 TABLET(300 MG) BY MOUTH DAILY 90 tablet 0  . butalbital-aspirin-caffeine (FIORINAL) 50-325-40 MG capsule TAKE 1 CAPSULE BY MOUTH THREE TIMES DAILY AS NEEDED FOR HEADACHE 45 capsule 0  . diclofenac (VOLTAREN) 75 MG EC tablet Take 1 tablet (75 mg total) by mouth 2 (two) times daily. 50 tablet 2  . levothyroxine  (SYNTHROID, LEVOTHROID) 112 MCG tablet TAKE 1 TABLET BY MOUTH EVERY DAY 30 tablet 6  . Multiple Vitamin (MULTIVITAMIN) tablet Take 1 tablet by mouth daily.    . sertraline (ZOLOFT) 50 MG tablet Take 1 tablet (50 mg total) by mouth daily. 30 tablet 3  . valACYclovir (VALTREX) 500 MG tablet Take 1 tablet (500 mg total) by mouth 2 (two) times daily. Take for 3 days prn. 30 tablet 1  . zolmitriptan (ZOMIG) 5 MG nasal solution Place 1 spray into the nose as needed for migraine. 6 Units 6   No current facility-administered medications for this visit.     Family History  Problem Relation Age of Onset  . Breast cancer Mother 75       recurence of breast cancer early 20's then developed bone cancer ate 13 /then developed bone cancer from recurence age 59  . Heart failure Father 55  . Hypertension Father   . Stroke Maternal Grandmother   . Diabetes Maternal Grandfather   . Diabetes Paternal Grandmother   . Cancer Sister 72       endometrial cancer    ROS:  Pertinent items are noted in HPI.  Otherwise, a comprehensive ROS was negative.  Exam:   BP 106/60 (BP Location: Right Arm,  Patient Position: Sitting, Cuff Size: Large)   Pulse 66   Resp 16   Ht 5' 6.5" (1.689 m)   Wt 211 lb 9.6 oz (96 kg)   LMP 08/13/2016 (Approximate)   BMI 33.64 kg/m     General appearance: alert, cooperative and appears stated age Head: Normocephalic, without obvious abnormality, atraumatic Neck: no adenopathy, supple, symmetrical, trachea midline and thyroid normal to inspection and palpation Lungs: clear to auscultation bilaterally Breasts: normal appearance, no masses or tenderness, No nipple retraction or dimpling, No nipple discharge or bleeding, No axillary or supraclavicular adenopathy Heart: regular rate and rhythm Abdomen: soft, non-tender; no masses, no organomegaly Extremities: extremities normal, atraumatic, no cyanosis or edema Skin: Skin color, texture, turgor normal. No rashes or lesions Lymph  nodes: Cervical, supraclavicular, and axillary nodes normal. No abnormal inguinal nodes palpated Neurologic: Grossly normal  Pelvic: External genitalia:  no lesions              Urethra:  normal appearing urethra with no masses, tenderness or lesions              Bartholins and Skenes: normal                 Vagina: normal appearing vagina with normal color and discharge, no lesions              Cervix: no lesions              Pap taken: No. Bimanual Exam:  Uterus:  normal size, contour, position, consistency, mobility, non-tender              Adnexa: no mass, fullness, tenderness              Rectal exam: Yes.  .  Confirms.              Anus:  normal sphincter tone,  Large prolapsing hemorrhoid.  Chaperone was present for exam.  Assessment:   Well woman visit with normal exam. FH of breast and possible uterine cancer.   Hx DVT.  Plan: Mammogram screening discussed. Recommended self breast awareness. Pap and HR HPV as above. Guidelines for Calcium, Vitamin D, regular exercise program including cardiovascular and weight bearing exercise. Referral to heme onc for follow up of DVT as she never had evaluation.  She will also consider genetic counseling through heme onc.  Referral to [facility] for colonoscopy. Labs with PCP.   Follow up annually and prn.   After visit summary provided.

## 2017-05-02 ENCOUNTER — Ambulatory Visit (INDEPENDENT_AMBULATORY_CARE_PROVIDER_SITE_OTHER): Payer: BLUE CROSS/BLUE SHIELD | Admitting: Obstetrics and Gynecology

## 2017-05-02 ENCOUNTER — Encounter: Payer: Self-pay | Admitting: Obstetrics and Gynecology

## 2017-05-02 VITALS — BP 106/60 | HR 66 | Resp 16 | Ht 66.5 in | Wt 211.6 lb

## 2017-05-02 DIAGNOSIS — Z86718 Personal history of other venous thrombosis and embolism: Secondary | ICD-10-CM

## 2017-05-02 DIAGNOSIS — Z1211 Encounter for screening for malignant neoplasm of colon: Secondary | ICD-10-CM

## 2017-05-02 DIAGNOSIS — Z803 Family history of malignant neoplasm of breast: Secondary | ICD-10-CM | POA: Diagnosis not present

## 2017-05-02 DIAGNOSIS — Z01419 Encounter for gynecological examination (general) (routine) without abnormal findings: Secondary | ICD-10-CM

## 2017-05-02 NOTE — Patient Instructions (Signed)

## 2017-05-06 ENCOUNTER — Other Ambulatory Visit: Payer: Self-pay | Admitting: Family Medicine

## 2017-05-09 ENCOUNTER — Telehealth: Payer: Self-pay | Admitting: Hematology and Oncology

## 2017-05-09 ENCOUNTER — Encounter: Payer: Self-pay | Admitting: Genetic Counselor

## 2017-05-09 ENCOUNTER — Encounter: Payer: Self-pay | Admitting: Hematology and Oncology

## 2017-05-09 NOTE — Telephone Encounter (Signed)
A hematology appt hs been scheduled for the pt to see Dr. Lindi Adie on 9/24 at 1pm. Genetic counseling appt has been scheduled for the pt to see Roma Kayser on 9/24 at 10am. Pt has agreed to the appts. Letter mailed.

## 2017-05-19 DIAGNOSIS — H524 Presbyopia: Secondary | ICD-10-CM | POA: Diagnosis not present

## 2017-05-20 DIAGNOSIS — M9902 Segmental and somatic dysfunction of thoracic region: Secondary | ICD-10-CM | POA: Diagnosis not present

## 2017-05-20 DIAGNOSIS — M9905 Segmental and somatic dysfunction of pelvic region: Secondary | ICD-10-CM | POA: Diagnosis not present

## 2017-05-20 DIAGNOSIS — M5386 Other specified dorsopathies, lumbar region: Secondary | ICD-10-CM | POA: Diagnosis not present

## 2017-05-20 DIAGNOSIS — M9903 Segmental and somatic dysfunction of lumbar region: Secondary | ICD-10-CM | POA: Diagnosis not present

## 2017-05-23 DIAGNOSIS — M9903 Segmental and somatic dysfunction of lumbar region: Secondary | ICD-10-CM | POA: Diagnosis not present

## 2017-05-23 DIAGNOSIS — M9905 Segmental and somatic dysfunction of pelvic region: Secondary | ICD-10-CM | POA: Diagnosis not present

## 2017-05-23 DIAGNOSIS — M5386 Other specified dorsopathies, lumbar region: Secondary | ICD-10-CM | POA: Diagnosis not present

## 2017-05-23 DIAGNOSIS — M9902 Segmental and somatic dysfunction of thoracic region: Secondary | ICD-10-CM | POA: Diagnosis not present

## 2017-05-26 DIAGNOSIS — M9903 Segmental and somatic dysfunction of lumbar region: Secondary | ICD-10-CM | POA: Diagnosis not present

## 2017-05-26 DIAGNOSIS — M5386 Other specified dorsopathies, lumbar region: Secondary | ICD-10-CM | POA: Diagnosis not present

## 2017-05-26 DIAGNOSIS — M9905 Segmental and somatic dysfunction of pelvic region: Secondary | ICD-10-CM | POA: Diagnosis not present

## 2017-05-26 DIAGNOSIS — M9902 Segmental and somatic dysfunction of thoracic region: Secondary | ICD-10-CM | POA: Diagnosis not present

## 2017-05-31 DIAGNOSIS — M9905 Segmental and somatic dysfunction of pelvic region: Secondary | ICD-10-CM | POA: Diagnosis not present

## 2017-05-31 DIAGNOSIS — M9902 Segmental and somatic dysfunction of thoracic region: Secondary | ICD-10-CM | POA: Diagnosis not present

## 2017-05-31 DIAGNOSIS — M5386 Other specified dorsopathies, lumbar region: Secondary | ICD-10-CM | POA: Diagnosis not present

## 2017-05-31 DIAGNOSIS — M9903 Segmental and somatic dysfunction of lumbar region: Secondary | ICD-10-CM | POA: Diagnosis not present

## 2017-06-02 ENCOUNTER — Encounter: Payer: Self-pay | Admitting: Gastroenterology

## 2017-06-03 ENCOUNTER — Other Ambulatory Visit: Payer: Self-pay

## 2017-06-03 DIAGNOSIS — I82401 Acute embolism and thrombosis of unspecified deep veins of right lower extremity: Secondary | ICD-10-CM

## 2017-06-05 ENCOUNTER — Other Ambulatory Visit: Payer: Self-pay | Admitting: Family Medicine

## 2017-06-06 ENCOUNTER — Encounter: Payer: Self-pay | Admitting: Genetic Counselor

## 2017-06-06 ENCOUNTER — Other Ambulatory Visit (HOSPITAL_BASED_OUTPATIENT_CLINIC_OR_DEPARTMENT_OTHER): Payer: BLUE CROSS/BLUE SHIELD

## 2017-06-06 ENCOUNTER — Ambulatory Visit: Payer: BLUE CROSS/BLUE SHIELD

## 2017-06-06 ENCOUNTER — Ambulatory Visit (HOSPITAL_BASED_OUTPATIENT_CLINIC_OR_DEPARTMENT_OTHER): Payer: BLUE CROSS/BLUE SHIELD | Admitting: Hematology and Oncology

## 2017-06-06 ENCOUNTER — Ambulatory Visit (HOSPITAL_BASED_OUTPATIENT_CLINIC_OR_DEPARTMENT_OTHER): Payer: BLUE CROSS/BLUE SHIELD | Admitting: Genetic Counselor

## 2017-06-06 DIAGNOSIS — Z86718 Personal history of other venous thrombosis and embolism: Secondary | ICD-10-CM

## 2017-06-06 DIAGNOSIS — Z803 Family history of malignant neoplasm of breast: Secondary | ICD-10-CM

## 2017-06-06 DIAGNOSIS — I82401 Acute embolism and thrombosis of unspecified deep veins of right lower extremity: Secondary | ICD-10-CM

## 2017-06-06 DIAGNOSIS — C539 Malignant neoplasm of cervix uteri, unspecified: Secondary | ICD-10-CM

## 2017-06-06 DIAGNOSIS — Z7183 Encounter for nonprocreative genetic counseling: Secondary | ICD-10-CM

## 2017-06-06 LAB — COMPREHENSIVE METABOLIC PANEL
ALT: 13 U/L (ref 0–55)
ANION GAP: 8 meq/L (ref 3–11)
AST: 20 U/L (ref 5–34)
Albumin: 3.8 g/dL (ref 3.5–5.0)
Alkaline Phosphatase: 92 U/L (ref 40–150)
BILIRUBIN TOTAL: 0.3 mg/dL (ref 0.20–1.20)
BUN: 11.7 mg/dL (ref 7.0–26.0)
CALCIUM: 9.7 mg/dL (ref 8.4–10.4)
CHLORIDE: 109 meq/L (ref 98–109)
CO2: 26 meq/L (ref 22–29)
Creatinine: 1 mg/dL (ref 0.6–1.1)
EGFR: 66 mL/min/{1.73_m2} — AB (ref >90)
Glucose: 90 mg/dl (ref 70–140)
Potassium: 4.1 mEq/L (ref 3.5–5.1)
Sodium: 143 mEq/L (ref 136–145)
TOTAL PROTEIN: 7.1 g/dL (ref 6.4–8.3)

## 2017-06-06 LAB — CBC WITH DIFFERENTIAL/PLATELET
BASO%: 0.2 % (ref 0.0–2.0)
Basophils Absolute: 0 10*3/uL (ref 0.0–0.1)
EOS ABS: 0.2 10*3/uL (ref 0.0–0.5)
EOS%: 2.4 % (ref 0.0–7.0)
HEMATOCRIT: 41.7 % (ref 34.8–46.6)
HGB: 13.7 g/dL (ref 11.6–15.9)
LYMPH%: 23.1 % (ref 14.0–49.7)
MCH: 30.5 pg (ref 25.1–34.0)
MCHC: 32.9 g/dL (ref 31.5–36.0)
MCV: 92.9 fL (ref 79.5–101.0)
MONO#: 0.5 10*3/uL (ref 0.1–0.9)
MONO%: 5.4 % (ref 0.0–14.0)
NEUT#: 6.4 10*3/uL (ref 1.5–6.5)
NEUT%: 68.9 % (ref 38.4–76.8)
PLATELETS: 256 10*3/uL (ref 145–400)
RBC: 4.49 10*6/uL (ref 3.70–5.45)
RDW: 12.9 % (ref 11.2–14.5)
WBC: 9.3 10*3/uL (ref 3.9–10.3)
lymph#: 2.1 10*3/uL (ref 0.9–3.3)

## 2017-06-06 NOTE — Progress Notes (Signed)
REFERRING PROVIDER: Nunzio Cobbs, MD [address] [place], [place] [address]  PRIMARY PROVIDER:  [name], MD  PRIMARY REASON FOR VISIT:  1. Malignant neoplasm of cervix, unspecified site ([place])   2. Family history of breast cancer      HISTORY OF PRESENT ILLNESS:   Gwendolyn Shields, a 51 y.o. female, was seen for a [place] cancer genetics consultation at the request of Dr. Yisroel Ramming due to a family history of cancer.  Gwendolyn Shields presents to clinic today to discuss the possibility of a hereditary predisposition to cancer, genetic testing, and to further clarify her future cancer risks, as well as potential cancer risks for family members.   At the age of 37, Gwendolyn Shields was diagnosed with cervical cancer. This was treated with LEEP procedure.  Gwendolyn Shields has not had any other diagnosis of cancer, and is being screened for breast cancer annually.  She has a colonoscopy scheduled for November.  Two years ago, Gwendolyn Shields had a blood clot in her leg.  It was not related to trauma, sitting for long periods of time such as traveling in a car or plane, or due to illness.  She was taken off of her birth control pills at the time, which she had been taking for approximately 29 years uneventfully, and is planning on seeing hematology about this.     CANCER HISTORY:   No history exists.     HORMONAL RISK FACTORS:  Menarche was at age 51.  First live birth at age 56.  OCP use for approximately 29 years.  Ovaries intact: yes.  Hysterectomy: no.  Menopausal status: postmenopausal.  HRT use: 0 years. Colonoscopy: no; not examined. Mammogram within the last year: yes. Number of breast biopsies: 1. Up to date with pelvic exams:  yes. Any excessive radiation exposure in the past:  no  Past Medical History:  Diagnosis Date  . Depression   . DVT (deep venous thrombosis) ([place])   . Family history of breast cancer   . Hyperlipidemia 08/2014  .  Migraine   . STD (sexually transmitted disease)    HSV 2  . Urinary incontinence     Past Surgical History:  Procedure Laterality Date  . BREAST BIOPSY Left 2001   benign  . CERVICAL BIOPSY  W/ LOOP ELECTRODE EXCISION     47yr ago  . DILATION AND CURETTAGE OF UTERUS     age 51 . INTRAUTERINE DEVICE (IUD) INSERTION  07/19/2014   Mirena    Social History   Social History  . Marital status: Married    Spouse name: N/A  . Number of children: N/A  . Years of education: N/A   Social History Main Topics  . Smoking status: Never Smoker  . Smokeless tobacco: Never Used  . Alcohol use 0.0 oz/week     Comment: occ glass of wine (2-3 glasses of wine per month)  . Drug use: No  . Sexual activity: No   Other Topics Concern  . None   Social History Narrative  . None     FAMILY HISTORY:  We obtained a detailed, 4-generation family history.  Significant diagnoses are listed below: Family History  Problem Relation Age of Onset  . Breast cancer Mother 679      recurence of breast cancer early 642'sthen developed bone cancer ate 648/then developed bone cancer from recurence age 51 . Heart failure Father 666 .  Hypertension Father   . Stroke Maternal Grandmother   . Diabetes Maternal Grandfather   . Diabetes Paternal Grandmother   . Cervical cancer Sister 85    The patient did not have a lot of knowledge of her extended family history.  Below is what she feels is accurate.    The patient has two daughters who are cancer free. She has two sisters, one who had cervical cancer and had a hysterectomy at age 45.  Both parents are deceased.  The patient's father had heart disease, and died around age 61.  He had four brothers and two sisters.  The patient is not aware that anyone had cancer.  His parents are deceased.  His mother died from complications of diabetes, and his father died before the patient was born.  The patient's mother had breast cancer in her 47's.  This was  treated with unilateral mastectomy and tamoxifen.  She had a recurrence on the same side about 7 years later in her early 60's.  This was treated with chemotherapy and radiation.  The breast cancer eventually metastasized to her bone.  She had a twin brother and another brother.  Neither had cancer.  The patient is not aware that their children were affected by cancer.  The maternal grandparents are both deceased.  The grandmother had a stroke at age 71 and was disabled but lived until her 9's and the grandfather had diabetes.  [name] is unaware of previous family history of genetic testing for hereditary cancer risks. Patient's maternal ancestors are of possibly [place] descent, and paternal ancestors are of [place] descent. There is no reported Ashkenazi Jewish ancestry. There is no known consanguinity.  GENETIC COUNSELING ASSESSMENT: Gwendolyn Shields is a 51 y.o. female with a family history of breast cancer which is somewhat suggestive of a sporadic cancer. We, therefore, discussed and recommended the following at today's visit.   DISCUSSION: We discussed that about 5-10% of breast cancer is hereditary with most cases due to BRCA mutations.  Other genes can play a part of a hereditary breast cancer syndrome, including ATM, CHEK2 and PALB2.  The patient's sister, as well as the patient, have been diagnosed with cervical cancer, or precancerous cells of the cervix.  We discussed that most cervical cancer is not part of a hereditary syndrome, and is more commonly associated to virus' such as HPV.  Therefore, we do not consider her sister's cancer, or her personal history of precancerous cervical cells, in the assessment.  We reviewed the characteristics, features and inheritance patterns of hereditary cancer syndromes. We also discussed genetic testing, including the appropriate family members to test, the process of testing, insurance coverage and turn-around-time for results. We discussed with Ms. Kimbrell  that the family history is not highly consistent with a familial hereditary cancer syndrome, and we feel she is at low risk to harbor a gene mutation associated with such a condition. We discussed that she may have genetic testing and pay out of pocket.  The cost is typically $250.  We discussed the implications of a negative, positive and/or variant of uncertain significant result. If Ms. Laskin decides to pursue out of pocket testing, we recommended Ms. Beehler pursue genetic testing for the Multi-gene panel. The Multi-Gene Panel offered by [facility] includes sequencing and/or deletion duplication testing of the following 80 genes: ALK, APC, ATM, AXIN2,BAP1,  BARD1, BLM, BMPR1A, BRCA1, BRCA2, BRIP1, CASR, CDC73, CDH1, CDK4, CDKN1B, CDKN1C, CDKN2A (p14ARF), CDKN2A (p16INK4a), CEBPA, CHEK2, CTNNA1, DICER1, DIS3L2,  EGFR (c.2369C>T, p.Thr790Met variant only), EPCAM (Deletion/duplication testing only), FH, FLCN, GATA2, GPC3, GREM1 (Promoter region deletion/duplication testing only), HOXB13 (c.251G>A, p.Gly84Glu), HRAS, KIT, MAX, MEN1, MET, MITF (c.952G>A, p.Glu318Lys variant only), MLH1, MSH2, MSH3, MSH6, MUTYH, NBN, NF1, NF2, NTHL1, PALB2, PDGFRA, PHOX2B, PMS2, POLD1, POLE, POT1, PRKAR1A, PTCH1, PTEN, RAD50, RAD51C, RAD51D, RB1, RECQL4, RET, RUNX1, SDHAF2, SDHA (sequence changes only), SDHB, SDHC, SDHD, SMAD4, SMARCA4, SMARCB1, SMARCE1, STK11, SUFU, TERT, TERT, TMEM127, TP53, TSC1, TSC2, VHL, WRN and WT1.    Ms. Reierson mentioned that she had a blood clot.  She took OCP's for about 29 years and had one blood clot about 2 years ago.  At this time, she was taken off OCP's.  Ms. Cilento does not report a family history of blood clots.  We discussed that there are several conditions that can predispose someone to clotting disorders.  Most commonly it is due to Factor V Leiden, but there are other clotting disorders as well.  Clotting disorders are not seen more commonly in hereditary cancer syndromes, and therefore, our  testing recommendations are not changed based on this information.  We discussed that some people do not want to undergo genetic testing due to fear of genetic discrimination.  A federal law called the Genetic Information Non-Discrimination Act (GINA) of 2008 helps protect individuals against genetic discrimination based on their genetic test results.  It impacts both health insurance and employment.  With health insurance, it protects against increased premiums, being kicked off insurance or being forced to take a test in order to be insured.  For employment it protects against hiring, firing and promoting decisions based on genetic test results.  Health status due to a cancer diagnosis is not protected under GINA.   Based on the patient's personal and family history, statistical models (Tyrer Cusik)  and literature data were used to estimate her risk of developing breast cancer. These estimate her lifetime risk of developing breast cancer to be approximately 19.1%. This estimation does not take into account any genetic testing results.  The patient's lifetime breast cancer risk is a preliminary estimate based on available information using one of several models endorsed by the [place] (ACS). The ACS recommends consideration of breast MRI screening as an adjunct to mammography for patients at high risk (defined as 20% or greater lifetime risk). A more detailed breast cancer risk assessment can be considered, if clinically indicated.      PLAN: After considering the risks, benefits, and limitations, Ms. Limones  provided decided to think about testing and will call if she decides to pursue genetic testing.  Ms. Causey did not wish to pursue genetic testing at today's visit. We understand this decision, and remain available to coordinate genetic testing at any time in the future. We; therefore, recommend Ms. Grabinski continue to follow the cancer screening guidelines given by her primary  healthcare provider.  Lastly, we encouraged Ms. Leedy to remain in contact with cancer genetics annually so that we can continuously update the family history and inform her of any changes in cancer genetics and testing that may be of benefit for this family.   Ms.  Eshleman questions were answered to her satisfaction today. Our contact information was provided should additional questions or concerns arise. Thank you for the referral and allowing [place] to share in the care of your patient.   Karen P. Florene Glen, [place], [facility] Certified Genetic Counselor [email or web address] phone: [phone]  The patient was seen for a total of 60 minutes in  face-to-face genetic counseling.  This patient was discussed with Drs. Magrinat, Lindi Adie and/or Burr Medico who agrees with the above.    _______________________________________________________________________ For Office Staff:  Number of people involved in session: 1 Was an Intern/ student involved with case: yes; Rise Paganini

## 2017-06-06 NOTE — Progress Notes (Signed)
[facility] NOTE  Patient Care Team: [name], MD as PCP - General (Family Medicine)  CHIEF COMPLAINTS/PURPOSE OF CONSULTATION:  History of DVT  HISTORY OF PRESENTING ILLNESS:  Gwendolyn Shields 51 y.o. female is here because of prior history of DVT without any underlying reason. Over a year ago she presented with pain and discomfort in the right lower extremity. She underwent ultrasound evaluation that revealed DVT. She was put on Xarelto and took it for 6 months. She has since also developed occasional discomfort but recent ultrasound did not reveal any blood clot. She was 5 sent to [place] for discussion regarding hypercoagulability workup as well as to assist her family in determining if they have to be worried about blood clots as well. There is no family history of blood clots. Patient has had a couple of pregnancy losses in the first trimester.  I reviewed her records extensively and collaborated the history with the patient.   MEDICAL HISTORY:  Past Medical History:  Diagnosis Date  . Depression   . DVT (deep venous thrombosis) ([place])   . Family history of breast cancer   . Hyperlipidemia 08/2014  . Migraine   . STD (sexually transmitted disease)    HSV 2  . Urinary incontinence     SURGICAL HISTORY: Past Surgical History:  Procedure Laterality Date  . BREAST BIOPSY Left 2001   benign  . CERVICAL BIOPSY  W/ LOOP ELECTRODE EXCISION     12yrs ago  . DILATION AND CURETTAGE OF UTERUS     age 13  . INTRAUTERINE DEVICE (IUD) INSERTION  07/19/2014   Mirena    SOCIAL HISTORY: Social History   Social History  . Marital status: Married    Spouse name: N/A  . Number of children: N/A  . Years of education: N/A   Occupational History  . Not on file.   Social History Main Topics  . Smoking status: Never Smoker  . Smokeless tobacco: Never Used  . Alcohol use 0.0 oz/week     Comment: occ glass of wine (2-3 glasses of wine per month)  . Drug  use: No  . Sexual activity: No   Other Topics Concern  . Not on file   Social History Narrative  . No narrative on file    FAMILY HISTORY: Family History  Problem Relation Age of Onset  . Breast cancer Mother 40       recurence of breast cancer early 62's then developed bone cancer ate 45 /then developed bone cancer from recurence age 28  . Heart failure Father 47  . Hypertension Father   . Stroke Maternal Grandmother   . Diabetes Maternal Grandfather   . Diabetes Paternal Grandmother   . Cervical cancer Sister 100    ALLERGIES:  is allergic to lipitor [atorvastatin].  MEDICATIONS:  Current Outpatient Prescriptions  Medication Sig Dispense Refill  . buPROPion (WELLBUTRIN XL) 300 MG 24 hr tablet TAKE 1 TABLET(300 MG) BY MOUTH DAILY 90 tablet 0  . butalbital-aspirin-caffeine (FIORINAL) 50-325-40 MG capsule TAKE 1 CAPSULE BY MOUTH THREE TIMES DAILY AS NEEDED FOR HEADACHE 45 capsule 0  . levothyroxine (SYNTHROID, LEVOTHROID) 112 MCG tablet TAKE 1 TABLET BY MOUTH EVERY DAY 30 tablet 6  . Multiple Vitamin (MULTIVITAMIN) tablet Take 1 tablet by mouth daily.    . sertraline (ZOLOFT) 50 MG tablet TAKE 1 TABLET(50 MG) BY MOUTH DAILY 30 tablet 6  . valACYclovir (VALTREX) 500 MG tablet Take 1 tablet (500 mg total)  by mouth 2 (two) times daily. Take for 3 days prn. 30 tablet 1  . zolmitriptan (ZOMIG) 5 MG nasal solution Place 1 spray into the nose as needed for migraine. 6 Units 6   No current facility-administered medications for this visit.     REVIEW OF SYSTEMS:   Constitutional: Denies fevers, chills or abnormal night sweats Eyes: Denies blurriness of vision, double vision or watery eyes Ears, nose, mouth, throat, and face: Denies mucositis or sore throat Respiratory: Denies cough, dyspnea or wheezes Cardiovascular: Denies palpitation, chest discomfort or lower extremity swelling Gastrointestinal:  Denies nausea, heartburn or change in bowel habits Skin: Denies abnormal skin  rashes Lymphatics: Denies new lymphadenopathy or easy bruising Neurological:Denies numbness, tingling or new weaknesses Behavioral/Psych: Mood is stable, no new changes   All other systems were reviewed with the patient and are negative.  PHYSICAL EXAMINATION: ECOG PERFORMANCE STATUS: 1 - Symptomatic but completely ambulatory  Vitals:   06/06/17 1313  BP: (!) 126/50  Pulse: 70  Resp: 20  Temp: 98.4 F (36.9 C)  SpO2: 99%   Filed Weights   06/06/17 1313  Weight: 213 lb 11.2 oz (96.9 kg)    GENERAL:alert, no distress and comfortable SKIN: skin color, texture, turgor are normal, no rashes or significant lesions EYES: normal, conjunctiva are pink and non-injected, sclera clear OROPHARYNX:no exudate, no erythema and lips, buccal mucosa, and tongue normal  NECK: supple, thyroid normal size, non-tender, without nodularity LYMPH:  no palpable lymphadenopathy in the cervical, axillary or inguinal LUNGS: clear to auscultation and percussion with normal breathing effort HEART: regular rate & rhythm and no murmurs and no lower extremity edema ABDOMEN:abdomen soft, non-tender and normal bowel sounds Musculoskeletal:no cyanosis of digits and no clubbing  PSYCH: alert & oriented x 3 with fluent speech NEURO: no focal motor/sensory deficits  LABORATORY DATA:  I have reviewed the data as listed Lab Results  Component Value Date   WBC 9.3 06/06/2017   HGB 13.7 06/06/2017   HCT 41.7 06/06/2017   MCV 92.9 06/06/2017   PLT 256 06/06/2017   Lab Results  Component Value Date   NA 143 06/06/2017   K 4.1 06/06/2017   CL 109 02/04/2017   CO2 26 06/06/2017    RADIOGRAPHIC STUDIES: I have personally reviewed the radiological reports and agreed with the findings in the report.  ASSESSMENT AND PLAN:  DVT (deep venous thrombosis) 11/28/2013: A peroneal vein branch at the level of the ankle demonstrates thrombus. A branch of the greater saphenous vein in the mid calf demonstrates  thrombus I discussed with the patient risk factors for blood clots.  Inherited risk factors include: 1. Factor V Leiden mutation 2. Prothrombin gene [id number] 3. Protein S deficiency  4. Protein C deficiency  5. Antithrombin deficiency  Acquired risk factors include: 1. Antiphospholipid antibody syndrome 2. Tobacco use: Patient does not smoke 3. Obesity: Patient is not obese 4. Medications including oral contraceptives/ HRT: Patient stopped it 5. Sedentary behavior including postoperative state 6. Foreign bodies in circulation: Patient does not have any  Workup recommended: Bloodwork to evaluate for the 5 inherited factors mentioned above along with antiphospholipid antibodies. I will see the patient back if she has any abnormalities on the blood work. I will call her with the results of these tests.   All questions were answered. The patient knows to call the clinic with any problems, questions or concerns.    Rulon Eisenmenger, MD 06/06/17

## 2017-06-06 NOTE — Assessment & Plan Note (Signed)
11/28/2013: A peroneal vein branch at the level of the ankle demonstrates thrombus. A branch of the greater saphenous vein in the mid calf demonstrates thrombus

## 2017-06-07 ENCOUNTER — Ambulatory Visit: Payer: BLUE CROSS/BLUE SHIELD

## 2017-06-07 DIAGNOSIS — M9902 Segmental and somatic dysfunction of thoracic region: Secondary | ICD-10-CM | POA: Diagnosis not present

## 2017-06-07 DIAGNOSIS — M5386 Other specified dorsopathies, lumbar region: Secondary | ICD-10-CM | POA: Diagnosis not present

## 2017-06-07 DIAGNOSIS — M9903 Segmental and somatic dysfunction of lumbar region: Secondary | ICD-10-CM | POA: Diagnosis not present

## 2017-06-07 DIAGNOSIS — M9905 Segmental and somatic dysfunction of pelvic region: Secondary | ICD-10-CM | POA: Diagnosis not present

## 2017-06-07 LAB — LUPUS ANTICOAGULANT PANEL
DRVVT: 43.9 s (ref 0.0–47.0)
PTT-LA: 37.5 s (ref 0.0–51.9)

## 2017-06-07 LAB — HOMOCYSTEINE: HOMOCYSTEINE: 7 umol/L (ref 0.0–15.0)

## 2017-06-07 LAB — PROTEIN S, TOTAL: Protein S, Total: 90 % (ref 60–150)

## 2017-06-07 LAB — ANTITHROMBIN III: Antithrombin Activity: 112 % (ref 75–135)

## 2017-06-08 LAB — BETA-2-GLYCOPROTEIN I ABS, IGG/M/A

## 2017-06-08 LAB — CARDIOLIPIN ANTIBODIES, IGG, IGM, IGA
Anticardiolipin Ab,IgA,Qn: <9 APL U/mL (ref 0–11)
Anticardiolipin Ab,IgG,Qn: <9 GPL U/mL (ref 0–14)
Anticardiolipin Ab,IgM,Qn: <9 MPL U/mL (ref 0–12)

## 2017-06-08 LAB — PROTEIN C, TOTAL: Protein C Antigen: 134 % (ref 60–150)

## 2017-06-09 ENCOUNTER — Other Ambulatory Visit: Payer: Self-pay | Admitting: Family Medicine

## 2017-06-09 NOTE — Telephone Encounter (Signed)
Medication filled to pharmacy as requested.   

## 2017-06-09 NOTE — Telephone Encounter (Signed)
Last OV 03/11/17 [facility] last filled 02/21/17 #45 with 0

## 2017-06-10 LAB — FACTOR 5 LEIDEN

## 2017-06-10 LAB — PROTHROMBIN GENE MUTATION

## 2017-06-17 ENCOUNTER — Other Ambulatory Visit: Payer: Self-pay | Admitting: Family Medicine

## 2017-06-20 ENCOUNTER — Telehealth: Payer: Self-pay | Admitting: Hematology and Oncology

## 2017-06-20 NOTE — Telephone Encounter (Signed)
I called the patient to discuss the hypercoagulability workup. The entire panel was negative for any inherited or acquired risk factors and we could test for. There is no need to be on anticoagulation. Patient was extremely happy to hear that.

## 2017-06-29 ENCOUNTER — Ambulatory Visit (INDEPENDENT_AMBULATORY_CARE_PROVIDER_SITE_OTHER): Payer: BLUE CROSS/BLUE SHIELD

## 2017-06-29 DIAGNOSIS — Z23 Encounter for immunization: Secondary | ICD-10-CM | POA: Diagnosis not present

## 2017-06-30 ENCOUNTER — Other Ambulatory Visit: Payer: Self-pay | Admitting: Obstetrics and Gynecology

## 2017-06-30 DIAGNOSIS — Z1231 Encounter for screening mammogram for malignant neoplasm of breast: Secondary | ICD-10-CM

## 2017-07-04 DIAGNOSIS — L814 Other melanin hyperpigmentation: Secondary | ICD-10-CM | POA: Diagnosis not present

## 2017-07-04 DIAGNOSIS — L821 Other seborrheic keratosis: Secondary | ICD-10-CM | POA: Diagnosis not present

## 2017-07-04 DIAGNOSIS — L570 Actinic keratosis: Secondary | ICD-10-CM | POA: Diagnosis not present

## 2017-07-04 DIAGNOSIS — D225 Melanocytic nevi of trunk: Secondary | ICD-10-CM | POA: Diagnosis not present

## 2017-07-04 DIAGNOSIS — D2272 Melanocytic nevi of left lower limb, including hip: Secondary | ICD-10-CM | POA: Diagnosis not present

## 2017-07-22 ENCOUNTER — Ambulatory Visit
Admission: RE | Admit: 2017-07-22 | Discharge: 2017-07-22 | Disposition: A | Discharge status: Home / Self Care | Payer: BLUE CROSS/BLUE SHIELD | Source: Ambulatory Visit | Attending: Obstetrics and Gynecology | Admitting: Obstetrics and Gynecology

## 2017-07-22 DIAGNOSIS — Z1231 Encounter for screening mammogram for malignant neoplasm of breast: Secondary | ICD-10-CM | POA: Diagnosis not present

## 2017-07-28 ENCOUNTER — Ambulatory Visit: Payer: BLUE CROSS/BLUE SHIELD | Admitting: Podiatry

## 2017-07-28 ENCOUNTER — Other Ambulatory Visit: Payer: Self-pay | Admitting: Podiatry

## 2017-07-28 ENCOUNTER — Encounter: Payer: Self-pay | Admitting: Podiatry

## 2017-07-28 ENCOUNTER — Ambulatory Visit (INDEPENDENT_AMBULATORY_CARE_PROVIDER_SITE_OTHER): Payer: BLUE CROSS/BLUE SHIELD

## 2017-07-28 DIAGNOSIS — M79672 Pain in left foot: Secondary | ICD-10-CM | POA: Diagnosis not present

## 2017-07-28 DIAGNOSIS — M109 Gout, unspecified: Secondary | ICD-10-CM | POA: Diagnosis not present

## 2017-07-28 DIAGNOSIS — M79671 Pain in right foot: Secondary | ICD-10-CM

## 2017-07-28 DIAGNOSIS — M779 Enthesopathy, unspecified: Secondary | ICD-10-CM

## 2017-07-28 MED ORDER — TRIAMCINOLONE ACETONIDE 10 MG/ML IJ SUSP
10.0000 mg | Freq: Once | INTRAMUSCULAR | Status: AC
  Administered 2017-07-28: 10 mg

## 2017-07-28 MED ORDER — PREDNISONE 10 MG PO TABS: ORAL_TABLET | ORAL | 0 refills | Status: DC

## 2017-07-29 ENCOUNTER — Other Ambulatory Visit: Payer: Self-pay

## 2017-07-29 ENCOUNTER — Ambulatory Visit (AMBULATORY_SURGERY_CENTER): Payer: Self-pay | Admitting: *Deleted

## 2017-07-29 VITALS — Ht 67.0 in | Wt 216.2 lb

## 2017-07-29 DIAGNOSIS — M109 Gout, unspecified: Secondary | ICD-10-CM | POA: Diagnosis not present

## 2017-07-29 DIAGNOSIS — Z1211 Encounter for screening for malignant neoplasm of colon: Secondary | ICD-10-CM

## 2017-07-29 MED ORDER — NA SULFATE-K SULFATE-MG SULF 17.5-3.13-1.6 GM/177ML PO SOLN: 1.0000 [IU] | Freq: Once | ORAL | 0 refills | Status: AC

## 2017-07-29 NOTE — Progress Notes (Signed)
Subjective:    Patient ID: Gwendolyn Shields, female   DOB: 51 y.o.   MRN: [id number]   HPI patient states that the joints are hurting her again in the left one is actually worse than the right one and she also is getting pain in her hands in her hips. States this is been going on for any extended period of time    ROS      Objective:  Physical Exam neurovascular status intact with inflammation around the second MPJ left over right with fluid buildup around the joint surface and inflammation upon palpation     Assessment:   Inflammatory capsulitis second MPJ left over right with possibility for systemic issues      Plan:    H&P x-rays reviewed and at this time I'm focusing on the left and did proximal nerve block aspirated the joint.out a small amount of clear fluid and injected quarter cc dexamethasone Kenalog. Advised on rigid bottom shoes and also I placed on sterile prep 12 day DS Dosepak and reviewed x-rays. Reappoint for [place] to recheck again in the next 3 weeks  X-ray was negative for fracture and it appears that this is inflammatory and I did go ahead and sent for blood work to rule out any systemic arthritic or gout condition

## 2017-07-29 NOTE — Progress Notes (Signed)
No egg or soy allergy known to patient  No issues with past sedation with any surgeries  or procedures, no intubation problems  No diet pills per patient No home 02 use per patient  No blood thinners per patient  Pt denies issues with constipation  No A fib or A flutter  EMMI video sent to pt's e mail  

## 2017-08-01 LAB — SEDIMENTATION RATE: SED RATE: 6 mm/h (ref 0–30)

## 2017-08-01 LAB — RHEUMATOID FACTOR

## 2017-08-01 LAB — ANA, IFA COMPREHENSIVE PANEL
ANA: NEGATIVE
ENA SM Ab Ser-aCnc: <1 AI
SM/RNP: NEGATIVE AI
SSA (RO) (ENA) ANTIBODY, IGG: NEGATIVE AI
SSB (La) (ENA) Antibody, IgG: <1 AI
Scleroderma (Scl-70) (ENA) Antibody, IgG: <1 AI
ds DNA Ab: 1 IU/mL

## 2017-08-01 LAB — C-REACTIVE PROTEIN: CRP: 4.4 mg/L (ref <8.0)

## 2017-08-01 LAB — URIC ACID: URIC ACID, SERUM: 3.7 mg/dL (ref 2.5–7.0)

## 2017-08-09 ENCOUNTER — Emergency Department (HOSPITAL_COMMUNITY)
Admission: EM | Admit: 2017-08-09 | Discharge: 2017-08-09 | Disposition: A | Discharge status: Home / Self Care | Payer: BLUE CROSS/BLUE SHIELD | Attending: Emergency Medicine | Admitting: Emergency Medicine

## 2017-08-09 ENCOUNTER — Encounter (HOSPITAL_COMMUNITY): Payer: Self-pay | Admitting: Emergency Medicine

## 2017-08-09 ENCOUNTER — Emergency Department (HOSPITAL_COMMUNITY): Payer: BLUE CROSS/BLUE SHIELD

## 2017-08-09 ENCOUNTER — Other Ambulatory Visit: Payer: Self-pay

## 2017-08-09 ENCOUNTER — Telehealth: Payer: Self-pay | Admitting: *Deleted

## 2017-08-09 DIAGNOSIS — R079 Chest pain, unspecified: Secondary | ICD-10-CM | POA: Insufficient documentation

## 2017-08-09 DIAGNOSIS — Z5321 Procedure and treatment not carried out due to patient leaving prior to being seen by health care provider: Secondary | ICD-10-CM | POA: Diagnosis not present

## 2017-08-09 LAB — CBC
HCT: 43 % (ref 36.0–46.0)
HEMOGLOBIN: 14.4 g/dL (ref 12.0–15.0)
MCH: 30.8 pg (ref 26.0–34.0)
MCHC: 33.5 g/dL (ref 30.0–36.0)
MCV: 91.9 fL (ref 78.0–100.0)
PLATELETS: 325 10*3/uL (ref 150–400)
RBC: 4.68 MIL/uL (ref 3.87–5.11)
RDW: 13.8 % (ref 11.5–15.5)
WBC: 19.7 10*3/uL — ABNORMAL HIGH (ref 4.0–10.5)

## 2017-08-09 LAB — BASIC METABOLIC PANEL
ANION GAP: 8 (ref 5–15)
BUN: 12 mg/dL (ref 6–20)
CALCIUM: 9.4 mg/dL (ref 8.9–10.3)
CO2: 27 mmol/L (ref 22–32)
CREATININE: 0.9 mg/dL (ref 0.44–1.00)
Chloride: 104 mmol/L (ref 101–111)
GLUCOSE: 121 mg/dL — AB (ref 65–99)
Potassium: 4.4 mmol/L (ref 3.5–5.1)
Sodium: 139 mmol/L (ref 135–145)

## 2017-08-09 LAB — I-STAT TROPONIN, ED: TROPONIN I, POC: 0 ng/mL (ref 0.00–0.08)

## 2017-08-09 NOTE — ED Notes (Signed)
Pt left because of wait time.  

## 2017-08-09 NOTE — Telephone Encounter (Signed)
Copied from [place]. Topic: Inquiry >> Aug 09, 2017 10:56 AM Aurelio Brash B wrote: Reason for CRM:   Pt was seen in ER for chest pain last night,  blood work ekg and xray pt wants to know if there is a way she can get the results and speak with Dr Birdie Riddle  about the results without making an apt.   Pt did not wait for results because she had waited over 2 hours.  Also suggesting pt to sign up for mychart

## 2017-08-09 NOTE — Telephone Encounter (Signed)
Can you please triage pt since she left ED last night without speaking with Provider.

## 2017-08-09 NOTE — Telephone Encounter (Signed)
Spoke with patient regarding symptoms and ER visit. Patient reports chest pain and arm numbness last night and once over the past weekend. She went to the ER last night where CXR, labs and EKG were completed. Patient states she left before receiving results d/t long wait time, all symptoms are currently resolved. Patient scheduled for f/u with PCP on Thursday, 08/11/17 @ 3 pm. Advised if CP re-occurs to go to Emergency Room, patient verbalized understanding.

## 2017-08-09 NOTE — ED Triage Notes (Signed)
Pt reports she went to lay down this AM and had sudden onset of central CP with radiation to back. Describes as heavy feeling, 7/10. Currently CP free. States hx of DVT

## 2017-08-11 ENCOUNTER — Encounter: Payer: Self-pay | Admitting: Family Medicine

## 2017-08-11 ENCOUNTER — Ambulatory Visit: Payer: BLUE CROSS/BLUE SHIELD | Admitting: Family Medicine

## 2017-08-11 ENCOUNTER — Other Ambulatory Visit (INDEPENDENT_AMBULATORY_CARE_PROVIDER_SITE_OTHER): Payer: BLUE CROSS/BLUE SHIELD

## 2017-08-11 ENCOUNTER — Telehealth: Payer: Self-pay | Admitting: Gastroenterology

## 2017-08-11 ENCOUNTER — Other Ambulatory Visit: Payer: Self-pay

## 2017-08-11 VITALS — BP 122/81 | HR 78 | Temp 98.6°F | Resp 16 | Ht 67.0 in | Wt 216.1 lb

## 2017-08-11 DIAGNOSIS — G43009 Migraine without aura, not intractable, without status migrainosus: Secondary | ICD-10-CM | POA: Diagnosis not present

## 2017-08-11 DIAGNOSIS — R079 Chest pain, unspecified: Secondary | ICD-10-CM

## 2017-08-11 MED ORDER — ELETRIPTAN HYDROBROMIDE 40 MG PO TABS: 40.0000 mg | ORAL_TABLET | ORAL | 6 refills | Status: DC | PRN

## 2017-08-11 NOTE — Telephone Encounter (Signed)
Ok no charge

## 2017-08-11 NOTE — Progress Notes (Signed)
   Subjective:    Patient ID: Gwendolyn Shields, female    DOB: Nov 20, 1965, 51 y.o.   MRN: [id number]  HPI ER F/U- Pt went to ER on 11/27 w/ chest pain.  Had sxs over the weekend and then again Monday night which prompted the ER visit.  Pt left w/o being seen.  EKG WNL.  Troponin was negative.  Yesterday had 'terrible headache' (hx of migraines).  Vomiting.  HA has resolved.  Pt reports very high levels of stress.  Denies GERD sxs.  No CP yesterday or today.  Pt will get 'hot, flushed feeling and then a wave of nausea'.  sxs are occurring 3+/week x1 month.     Review of Systems For ROS see HPI     Objective:   Physical Exam  Constitutional: She is oriented to person, place, and time. She appears well-developed and well-nourished. No distress.  HENT:  Head: Normocephalic and atraumatic.  Eyes: Conjunctivae and EOM are normal. Pupils are equal, round, and reactive to light.  Neck: Normal range of motion. Neck supple. No thyromegaly present.  Cardiovascular: Normal rate, regular rhythm, normal heart sounds and intact distal pulses.  No murmur heard. Pulmonary/Chest: Effort normal and breath sounds normal. No respiratory distress.  Abdominal: Soft. She exhibits no distension. There is no tenderness.  Musculoskeletal: She exhibits no edema.  Lymphadenopathy:    She has no cervical adenopathy.  Neurological: She is alert and oriented to person, place, and time.  Skin: Skin is warm and dry.  Psychiatric: She has a normal mood and affect. Her behavior is normal.  Vitals reviewed.         Assessment & Plan:

## 2017-08-11 NOTE — Telephone Encounter (Signed)
Patient canceled procedure for tomorrow 11.30.18 due to having to go to the hosp for chest pain this Monday. Patient states she has to follow up with her pcp first before she can have appt. Patient rescheduled to 12.27.18.

## 2017-08-11 NOTE — Patient Instructions (Signed)
Go to [facility] and get your labs done (our lab is out sick today) We'll notify you of your lab results and make any changes if needed We'll call you with your stress test to make sure this is not heart related Take the Eletriptan as needed for migraines Drink plenty of fluids to help prevent headaches REST! Call with any questions or concerns Hang in there!  We'll figure this out!

## 2017-08-12 ENCOUNTER — Encounter: Payer: BLUE CROSS/BLUE SHIELD | Admitting: Gastroenterology

## 2017-08-12 LAB — CBC WITH DIFFERENTIAL/PLATELET
BASOS PCT: 0.8 % (ref 0.0–3.0)
Basophils Absolute: 0.1 10*3/uL (ref 0.0–0.1)
EOS PCT: 1.5 % (ref 0.0–5.0)
Eosinophils Absolute: 0.2 10*3/uL (ref 0.0–0.7)
HEMATOCRIT: 43 % (ref 36.0–46.0)
Hemoglobin: 13.9 g/dL (ref 12.0–15.0)
LYMPHS ABS: 2.7 10*3/uL (ref 0.7–4.0)
LYMPHS PCT: 21 % (ref 12.0–46.0)
MCHC: 32.4 g/dL (ref 30.0–36.0)
MCV: 92.9 fl (ref 78.0–100.0)
MONOS PCT: 8.1 % (ref 3.0–12.0)
Monocytes Absolute: 1.1 10*3/uL — ABNORMAL HIGH (ref 0.1–1.0)
NEUTROS ABS: 8.9 10*3/uL — AB (ref 1.4–7.7)
NEUTROS PCT: 68.6 % (ref 43.0–77.0)
PLATELETS: 309 10*3/uL (ref 150.0–400.0)
RBC: 4.62 Mil/uL (ref 3.87–5.11)
RDW: 13.8 % (ref 11.5–15.5)
WBC: 13 10*3/uL — ABNORMAL HIGH (ref 4.0–10.5)

## 2017-08-12 LAB — BASIC METABOLIC PANEL
BUN: 16 mg/dL (ref 6–23)
CHLORIDE: 103 meq/L (ref 96–112)
CO2: 29 meq/L (ref 19–32)
Calcium: 9.5 mg/dL (ref 8.4–10.5)
Creatinine, Ser: 0.96 mg/dL (ref 0.40–1.20)
GFR: 64.84 mL/min (ref >60.00)
GLUCOSE: 82 mg/dL (ref 70–99)
POTASSIUM: 4.5 meq/L (ref 3.5–5.1)
SODIUM: 140 meq/L (ref 135–145)

## 2017-08-12 LAB — TSH: TSH: 2.07 u[IU]/mL (ref 0.35–4.50)

## 2017-08-15 NOTE — Assessment & Plan Note (Signed)
Deteriorated.  Pt reports Zomig is not working for her but her daughter's Relpax 'knocks it right out'.  Prescription given for Relpax.  Reviewed supportive care and red flags that should prompt return.  Pt expressed understanding and is in agreement w/ plan.

## 2017-08-15 NOTE — Assessment & Plan Note (Signed)
New.  Pt's cursory ER workup WNL but she left w/o being seen.  No CP for the 2 days prior to being seen today.  Check labs to r/o thyroid, anemia, or electrolyte disturbance.  Will refer for stress test to ensure this is not cardiac- although the sxs sound atypical of cardiac chest pain.  Will follow closely.

## 2017-08-17 ENCOUNTER — Ambulatory Visit: Payer: BLUE CROSS/BLUE SHIELD | Admitting: Physician Assistant

## 2017-08-17 ENCOUNTER — Other Ambulatory Visit: Payer: Self-pay

## 2017-08-17 ENCOUNTER — Encounter: Payer: Self-pay | Admitting: Physician Assistant

## 2017-08-17 VITALS — BP 118/78 | HR 75 | Temp 97.7°F | Resp 14 | Ht 67.0 in | Wt 216.0 lb

## 2017-08-17 DIAGNOSIS — G43709 Chronic migraine without aura, not intractable, without status migrainosus: Secondary | ICD-10-CM

## 2017-08-17 DIAGNOSIS — IMO0002 Reserved for concepts with insufficient information to code with codable children: Secondary | ICD-10-CM

## 2017-08-17 MED ORDER — ONDANSETRON 4 MG PO TBDP: 4.0000 mg | ORAL_TABLET | Freq: Three times a day (TID) | ORAL | 0 refills | Status: DC | PRN

## 2017-08-17 MED ORDER — VENLAFAXINE HCL ER 37.5 MG PO CP24: 37.5000 mg | ORAL_CAPSULE | Freq: Every day | ORAL | 1 refills | Status: DC

## 2017-08-17 NOTE — Progress Notes (Signed)
Patient presents to clinic today c/o continued daily headache. Patient with history of chronic migraine, previously having 1 headache every 1-2 weeks. Over the past few months have noticed increasing frequency. Denies trauma or injury. Headaches are similar to previous migraines in the sense that they cause photophobia, phonophobia and nausea. Has taken Relpax which works well for abortive therapy but she is having to use daily. Was previously on Propranolol but made her excessively fatigued. Wants to avoid elavil as her daughher tried and had significant side effect. Was also on Topamax previously with some relief but does not want to restart.  Past Medical History:  Diagnosis Date  . Clotting disorder ([place])    dvt in rt.leg 2016  . Depression   . DVT (deep venous thrombosis) ([place])   . Dysplasia of cervix   . Family history of breast cancer   . Hyperlipidemia 08/2014   borderline  . Migraine   . STD (sexually transmitted disease)    HSV 2  . Thyroid disease   . Urinary incontinence     Current Outpatient Medications on File Prior to Visit  Medication Sig Dispense Refill  . buPROPion (WELLBUTRIN XL) 300 MG 24 hr tablet TAKE 1 TABLET(300 MG) BY MOUTH DAILY 90 tablet 0  . eletriptan (RELPAX) 40 MG tablet Take 1 tablet (40 mg total) by mouth as needed for migraine or headache. May repeat in 2 hours if headache persists or recurs. 10 tablet 6  . levothyroxine (SYNTHROID, LEVOTHROID) 112 MCG tablet TAKE 1 TABLET BY MOUTH EVERY DAY 30 tablet 6  . Multiple Vitamin (MULTIVITAMIN) tablet Take 1 tablet by mouth daily.     No current facility-administered medications on file prior to visit.     Allergies  Allergen Reactions  . Lipitor [Atorvastatin] Nausea Only and Other (See Comments)    HA  . Adhesive [Tape] Rash    Family History  Problem Relation Age of Onset  . Breast cancer Mother 15       recurence of breast cancer early 61's then developed bone cancer ate 51 /then developed  bone cancer from recurence age 28  . Heart failure Father 4  . Hypertension Father   . Stroke Maternal Grandmother   . Diabetes Maternal Grandfather   . Diabetes Paternal Grandmother   . Cervical cancer Sister 51  . Colon cancer Neg Hx   . Colon polyps Neg Hx   . Esophageal cancer Neg Hx   . Rectal cancer Neg Hx   . Stomach cancer Neg Hx     Social History   Socioeconomic History  . Marital status: Married    Spouse name: None  . Number of children: None  . Years of education: None  . Highest education level: None  Social Needs  . Financial resource strain: None  . Food insecurity - worry: None  . Food insecurity - inability: None  . Transportation needs - medical: None  . Transportation needs - non-medical: None  Occupational History  . None  Tobacco Use  . Smoking status: Never Smoker  . Smokeless tobacco: Never Used  Substance and Sexual Activity  . Alcohol use: Yes    Alcohol/week: 0.0 oz    Comment: occ glass of wine (2-3 glasses of wine per month)  . Drug use: No  . Sexual activity: No    Partners: Male    Birth control/protection: None  Other Topics Concern  . None  Social History Narrative  . None   Review of  Systems - See HPI.  All other ROS are negative.  BP 118/78   Pulse 75   Temp 97.7 F (36.5 C) (Oral)   Resp 14   Ht 5' 7" (1.702 m)   Wt 216 lb (98 kg)   LMP 08/13/2016 (Approximate)   SpO2 98%   BMI 33.83 kg/m   Physical Exam  Constitutional: She is oriented to person, place, and time and well-developed, well-nourished, and in no distress.  HENT:  Head: Normocephalic and atraumatic.  Right Ear: External ear normal.  Left Ear: External ear normal.  Nose: Nose normal.  Mouth/Throat: Oropharynx is clear and moist.  Eyes: Conjunctivae are normal.  Neck: Neck supple.  Cardiovascular: Normal rate, regular rhythm, normal heart sounds and intact distal pulses.  Pulmonary/Chest: Effort normal and breath sounds normal. No respiratory  distress. She has no wheezes. She has no rales. She exhibits no tenderness.  Neurological: She is alert and oriented to person, place, and time. No cranial nerve deficit. Gait normal. Coordination normal.  Skin: Skin is warm and dry. No rash noted.  Psychiatric: Affect normal.  Vitals reviewed.  Recent Results (from the past 2160 hour(s))  CBC with Differential     Status: None   Collection Time: 06/06/17 12:56 PM  Result Value Ref Range   WBC 9.3 3.9 - 10.3 10e3/uL   NEUT# 6.4 1.5 - 6.5 10e3/uL   HGB 13.7 11.6 - 15.9 g/dL   HCT 41.7 34.8 - 46.6 %   Platelets 256 145 - 400 10e3/uL   MCV 92.9 79.5 - 101.0 fL   MCH 30.5 25.1 - 34.0 pg   MCHC 32.9 31.5 - 36.0 g/dL   RBC 4.49 3.70 - 5.45 10e6/uL   RDW 12.9 11.2 - 14.5 %   lymph# 2.1 0.9 - 3.3 10e3/uL   MONO# 0.5 0.1 - 0.9 10e3/uL   Eosinophils Absolute 0.2 0.0 - 0.5 10e3/uL   Basophils Absolute 0.0 0.0 - 0.1 10e3/uL   NEUT% 68.9 38.4 - 76.8 %   LYMPH% 23.1 14.0 - 49.7 %   MONO% 5.4 0.0 - 14.0 %   EOS% 2.4 0.0 - 7.0 %   BASO% 0.2 0.0 - 2.0 %  Comprehensive metabolic panel     Status: Abnormal   Collection Time: 06/06/17 12:56 PM  Result Value Ref Range   Sodium 143 136 - 145 mEq/L   Potassium 4.1 3.5 - 5.1 mEq/L   Chloride 109 98 - 109 mEq/L   CO2 26 22 - 29 mEq/L   Glucose 90 70 - 140 mg/dl    Comment: Glucose reference range is for nonfasting patients. Fasting glucose reference range is 70- 100.   BUN 11.7 7.0 - 26.0 mg/dL   Creatinine 1.0 0.6 - 1.1 mg/dL   Total Bilirubin 0.30 0.20 - 1.20 mg/dL   Alkaline Phosphatase 92 40 - 150 U/L   AST 20 5 - 34 U/L   ALT 13 0 - 55 U/L   Total Protein 7.1 6.4 - 8.3 g/dL   Albumin 3.8 3.5 - 5.0 g/dL   Calcium 9.7 8.4 - 10.4 mg/dL   Anion Gap 8 3 - 11 mEq/L   EGFR 66 (L) >90 ml/min/1.73 m2    Comment: eGFR is calculated using the CKD-EPI Creatinine Equation (2009)  Lupus anticoagulant panel     Status: None   Collection Time: 06/06/17  1:54 PM  Result Value Ref Range   PTT-LA 37.5  0.0 - 51.9 sec   dRVVT 43.9 0.0 - 47.0 sec  Lupus Reflex Interpretation Comment:     Comment: No lupus anticoagulant was detected.  Factor 5 leiden     Status: None   Collection Time: 06/06/17  1:54 PM  Result Value Ref Range   Factor V Leiden Comment     Comment: Result:  Negative (no mutation found) Factor V Leiden is a specific mutation (R506Q) in the factor V gene that is associated with an increased risk of venous thrombosis. Factor V Leiden is more resistant to inactivation by activated protein C.  As a result, factor V persists in the circulation leading to a mild hyper- coagulable state.  The Leiden mutation accounts for 90% - 95% of APC resistance.  Factor V Leiden has been reported in patients with deep vein thrombosis, pulmonary embolus, central retinal vein occlusion, cerebral sinus thrombosis and hepatic vein thrombosis. Other risk factors to be considered in the workup for venous thrombosis include the G20210A mutation in the factor II (prothrombin) gene, protein S and C deficiency, and antithrombin deficiencies. Anticardiolipin antibody and lupus anticoagulant analysis may be appropriate for certain patients, as well as homocysteine levels. Contact your local [facility] for information on how to order additional tes ting if desired. **Genetic counselors are available for health care providers to**   discuss results at [phone]-GENE [phone]). Methodology: DNA analysis of the Factor V gene was performed by allele-specific PCR. The diagnostic sensitivity and specificity is >99% for both. Molecular-based testing is highly accurate, but as in any laboratory test, diagnostic errors may occur. All test results must be combined with clinical information for the most accurate interpretation. This test was developed and its performance characteristics determined by [facility]. It has not been cleared or approved by the Food and Drug Administration. References: Voelkerding K  (1996).  Clin Lab Med [phone]. Allison Quarry, PhD, [facility] Ruben Reason, PhD, [facility] Annetta Maw, M.S., PhD, [facility] Alfredo Bach, PhD, [facility] Norva Riffle, PhD, [facility] Earlean Polka PhD, [facility]   Prothrombin gene mutation     Status: None   Collection Time: 06/06/17  1:54 PM  Result Value Ref Range   Factor II, DNA Analysis Comment     Comment: NEGATIVE No mutation identified. Comment: A point mutation (G20210A) in the factor II (prothrombin) gene is the second most common cause of inherited thrombophilia. The incidence of this mutation in the [place] Caucasian population is about 2% and in the [place] American population it is approximately 0.5%. This mutation is rare in the [place] and Native American population. Being heterozygous for a prothrombin mutation increases the risk for developing venous thrombosis about 2 to 3 times above the general population risk. Being homozygous for the prothrombin gene mutation increases the relative risk for venous thrombosis further, although it is not yet known how much further the risk is increased. In women heterozygous for the prothrombin gene mutation, the use of estrogen containing oral contraceptives increases the relative risk of venous thrombosis about 16 times and the risk of developing cerebral thrombosis is also significantly increased. In pregnancy the prothrombin  gene mutation increases risk for venous thrombosis and may increase risk for stillbirth, placental abruption, pre-eclampsia and fetal growth restriction. If the patient possesses two or more congenital or acquired thrombophilic risk factors, the risk for thrombosis may rise to more than the sum of the risk ratios for the individual mutations. This assay detects only the prothrombin G20210A mutation and does not measure genetic abnormalities elsewhere in the genome. Other thrombotic risk factors may be pursued through systematic  clinical laboratory analysis.  These factors include the R506Q (Leiden) mutation in the Factor V gene, plasma homocysteine levels, as well as testing for deficiencies of antithrombin III, protein C and protein S. Genetic Counselors are available for health care providers to discuss results at [phone]-GENE [phone]). Methodology: DNA analysis of the Factor II gene was performed by PCR amplification followed by restriction analysis. The diagnostic  sensitivity is >99% for both. All the tests must be combined with clinical information for the most accurate interpretation. Molecular-based testing is highly accurate, but as in any laboratory test, diagnostic errors may occur. This test was developed and its performance characteristics determined by [facility]. It has not been cleared or approved by the Food and Drug Administration. Poort SR, et al. Blood. 1996; 02:7253-6644. Varga EA. Circulation. 2004; 034:V42-V95. Mervin Hack, et Petrey; 19:700-703. Allison Quarry, PhD, [facility] Ruben Reason, PhD, [facility] Annetta Maw, M.S., PhD, [facility] Alfredo Bach, PhD, [facility] Norva Riffle, PhD, [facility] Earlean Polka, PhD, [place], total     Status: None   Collection Time: 06/06/17  1:54 PM  Result Value Ref Range   Protein S, Total 90 60 - 150 %    Comment: This test was developed and its performance characteristics determined by [facility]. It has not been cleared or approved by the Food and Drug Administration.   Protein C, total*     Status: None   Collection Time: 06/06/17  1:54 PM  Result Value Ref Range   Protein C Antigen 134 60 - 150 %  Homocysteine, serum     Status: None   Collection Time: 06/06/17  1:54 PM  Result Value Ref Range   Homocysteine 7.0 0.0 - 15.0 umol/L  Cardiolipin antibodies, IgG, IgM, IgA*     Status: None   Collection Time: 06/06/17  1:54 PM  Result Value Ref Range   Anticardiolipin Ab,IgG,Qn <9 0 - 14 GPL U/mL    Comment:                                           Negative:              <15                                          Indeterminate:     15 - 20                                          Low-Med Positive: >20 - 80                                          High Positive:         >80    Anticardiolipin Ab,IgM,Qn <9 0 - 12 MPL U/mL    Comment:  Negative:              <13                                          Indeterminate:     13 - 20                                          Low-Med Positive: >20 - 80                                          High Positive:         >80    Anticardiolipin Ab,IgA,Qn <9 0 - 11 APL U/mL    Comment:                                          Negative:              <12                                          Indeterminate:     12 - 20                                          Low-Med Positive: >20 - 80                                          High Positive:         >80   Antithrombin III*     Status: None   Collection Time: 06/06/17  1:54 PM  Result Value Ref Range   Antithrombin Activity 112 75 - 135 %    Comment: Direct Xa inhibitor anticoagulants such as rivaroxaban, apixaban and edoxaban will lead to spuriously elevated antithrombin activity levels possibly masking a deficiency.   Beta-2-glycoprotein i abs, IgG/M/A     Status: None   Collection Time: 06/06/17  1:54 PM  Result Value Ref Range   Beta-2 Glycoprotein I Ab, IgG <9 0 - 20 GPI IgG units    Comment: The reference interval reflects a 3SD or 99th percentile interval, which is thought to represent a potentially clinically significant result in accordance with the International Consensus Statement on the classification criteria for definitive antiphospholipid syndrome (APS). J Thromb Haem 2006;4:295-306.    Beta-2 Glyco 1 IgA <9 0 - 25 GPI IgA units    Comment: The reference interval reflects a 3SD or 99th percentile interval, which is thought to represent a potentially clinically  significant result in accordance with the International Consensus Statement on the classification criteria for definitive antiphospholipid syndrome (APS). J Thromb Haem 2006;4:295-306.    Beta-2 Glyco 1 IgM <9 0 - 32 GPI IgM units    Comment: The reference  interval reflects a 3SD or 99th percentile interval, which is thought to represent a potentially clinically significant result in accordance with the International Consensus Statement on the classification criteria for definitive antiphospholipid syndrome (APS). J Thromb Haem 2006;4:295-306.   ANA, IFA Comprehensive Panel     Status: None   Collection Time: 07/29/17 11:59 AM  Result Value Ref Range   Anit Nuclear Antibody(ANA) NEGATIVE NEGATIVE    Comment: ANA IFA is a first line screen for detecting the presence of up to approximately 150 autoantibodies in various autoimmune diseases. A negative ANA IFA result suggests ANA-associated autoimmune diseases are not present at this time. . Visit Physician FAQs for interpretation of all antibodies in the [place], prevalence, and association with diseases at http://education.[facility].com/ [email or web address]/[email or web address] .    ds DNA Ab 1 IU/mL    Comment:                            IU/mL       Interpretation                            < or = 4    Negative                            5-9         Indeterminate                            > or = 10   Positive .    Scleroderma (Scl-70) (ENA) Antibody, IgG <1.0 NEG <1.0 NEG AI   ENA SM Ab Ser-aCnc <1.0 NEG <1.0 NEG AI   SM/RNP <1.0 NEG <1.0 NEG AI   SSA (Ro) (ENA) Antibody, IgG <1.0 NEG <1.0 NEG AI   SSB (La) (ENA) Antibody, IgG <1.0 NEG <1.0 NEG AI  C-reactive protein     Status: None   Collection Time: 07/29/17 11:59 AM  Result Value Ref Range   CRP 4.4 <8.0 mg/L  Rheumatoid factor     Status: None   Collection Time: 07/29/17 11:59 AM  Result Value Ref Range   Rhuematoid fact SerPl-aCnc <14 <14 IU/mL  Sedimentation rate     Status: None    Collection Time: 07/29/17 11:59 AM  Result Value Ref Range   Sed Rate 6 0 - 30 mm/h  Uric acid     Status: None   Collection Time: 07/29/17 11:59 AM  Result Value Ref Range   Uric Acid, Serum 3.7 2.5 - 7.0 mg/dL    Comment: Therapeutic target for gout patients: <6.0 mg/dL .   Basic metabolic panel     Status: Abnormal   Collection Time: 08/09/17  2:38 AM  Result Value Ref Range   Sodium 139 135 - 145 mmol/L   Potassium 4.4 3.5 - 5.1 mmol/L   Chloride 104 101 - 111 mmol/L   CO2 27 22 - 32 mmol/L   Glucose, Bld 121 (H) 65 - 99 mg/dL   BUN 12 6 - 20 mg/dL   Creatinine, Ser 0.90 0.44 - 1.00 mg/dL   Calcium 9.4 8.9 - 10.3 mg/dL   GFR calc non Af Amer >60 >60 mL/min   GFR calc Af Amer >60 >60 mL/min    Comment: (NOTE) The eGFR has been calculated using the CKD EPI equation. This calculation has not been  validated in all clinical situations. eGFR's persistently <60 mL/min signify possible Chronic Kidney Disease.    Anion gap 8 5 - 15  CBC     Status: Abnormal   Collection Time: 08/09/17  2:38 AM  Result Value Ref Range   WBC 19.7 (H) 4.0 - 10.5 K/uL   RBC 4.68 3.87 - 5.11 MIL/uL   Hemoglobin 14.4 12.0 - 15.0 g/dL   HCT 43.0 36.0 - 46.0 %   MCV 91.9 78.0 - 100.0 fL   MCH 30.8 26.0 - 34.0 pg   MCHC 33.5 30.0 - 36.0 g/dL   RDW 13.8 11.5 - 15.5 %   Platelets 325 150 - 400 K/uL  I-stat troponin, ED     Status: None   Collection Time: 08/09/17  2:42 AM  Result Value Ref Range   Troponin i, poc 0.00 0.00 - 0.08 ng/mL   Comment 3            Comment: Due to the release kinetics of cTnI, a negative result within the first hours of the onset of symptoms does not rule out myocardial infarction with certainty. If myocardial infarction is still suspected, repeat the test at appropriate intervals.   Basic metabolic panel     Status: None   Collection Time: 08/11/17  2:54 PM  Result Value Ref Range   Sodium 140 135 - 145 mEq/L   Potassium 4.5 3.5 - 5.1 mEq/L   Chloride 103 96 -  112 mEq/L   CO2 29 19 - 32 mEq/L   Glucose, Bld 82 70 - 99 mg/dL   BUN 16 6 - 23 mg/dL   Creatinine, Ser 0.96 0.40 - 1.20 mg/dL   Calcium 9.5 8.4 - 10.5 mg/dL   GFR 64.84 >60.00 mL/min  TSH     Status: None   Collection Time: 08/11/17  2:54 PM  Result Value Ref Range   TSH 2.07 0.35 - 4.50 uIU/mL  CBC with Differential/Platelet     Status: Abnormal   Collection Time: 08/11/17  2:54 PM  Result Value Ref Range   WBC 13.0 (H) 4.0 - 10.5 K/uL   RBC 4.62 3.87 - 5.11 Mil/uL   Hemoglobin 13.9 12.0 - 15.0 g/dL   HCT 43.0 36.0 - 46.0 %   MCV 92.9 78.0 - 100.0 fl   MCHC 32.4 30.0 - 36.0 g/dL   RDW 13.8 11.5 - 15.5 %   Platelets 309.0 150.0 - 400.0 K/uL   Neutrophils Relative % 68.6 43.0 - 77.0 %   Lymphocytes Relative 21.0 12.0 - 46.0 %   Monocytes Relative 8.1 3.0 - 12.0 %   Eosinophils Relative 1.5 0.0 - 5.0 %   Basophils Relative 0.8 0.0 - 3.0 %   Neutro Abs 8.9 (H) 1.4 - 7.7 K/uL   Lymphs Abs 2.7 0.7 - 4.0 K/uL   Monocytes Absolute 1.1 (H) 0.1 - 1.0 K/uL   Eosinophils Absolute 0.2 0.0 - 0.7 K/uL   Basophils Absolute 0.1 0.0 - 0.1 K/uL    Assessment/Plan: 1. Chronic migraine Will start Effexor XR 37.5 mg daily. Continue Relpax for abortive therapy. I do believe stress is a factor speaking with patient. Recommended time for daily decompression. Follow-up with headache specialist as scheduled.   Leeanne Rio, PA-C

## 2017-08-17 NOTE — Patient Instructions (Signed)
Please stay well-hydrated and get plenty of rest. Try to keep a well-balanced diet and make sure not to skip meals. Continue Relpax as needed for migraine abortive therapy. Please start the low dose of Venlafaxine XR daily.  This should start helping with headaches and hot-flashes.  Follow-up with Headache specialist as scheduled.   Migraine Headache A migraine headache is a very strong throbbing pain on one side or both sides of your head. Migraines can also cause other symptoms. Talk with your doctor about what things may bring on (trigger) your migraine headaches. Follow these instructions at home: Medicines  Take over-the-counter and prescription medicines only as told by your doctor.  Do not drive or use heavy machinery while taking prescription pain medicine.  To prevent or treat constipation while you are taking prescription pain medicine, your doctor may recommend that you: ? Drink enough fluid to keep your pee (urine) clear or pale yellow. ? Take over-the-counter or prescription medicines. ? Eat foods that are high in fiber. These include fresh fruits and vegetables, whole grains, and beans. ? Limit foods that are high in fat and processed sugars. These include fried and sweet foods. Lifestyle  Avoid alcohol.  Do not use any products that contain nicotine or tobacco, such as cigarettes and e-cigarettes. If you need help quitting, ask your doctor.  Get at least 8 hours of sleep every night.  Limit your stress. General instructions   Keep a journal to find out what may bring on your migraines. For example, write down: ? What you eat and drink. ? How much sleep you get. ? Any change in what you eat or drink. ? Any change in your medicines.  If you have a migraine: ? Avoid things that make your symptoms worse, such as bright lights. ? It may help to lie down in a dark, quiet room. ? Do not drive or use heavy machinery. ? Ask your doctor what activities are safe for  you.  Keep all follow-up visits as told by your doctor. This is important. Contact a doctor if:  You get a migraine that is different or worse than your usual migraines. Get help right away if:  Your migraine gets very bad.  You have a fever.  You have a stiff neck.  You have trouble seeing.  Your muscles feel weak or like you cannot control them.  You start to lose your balance a lot.  You start to have trouble walking.  You pass out (faint). This information is not intended to replace advice given to you by your health care provider. Make sure you discuss any questions you have with your health care provider. Document Released: 06/08/2008 Document Revised: 03/19/2016 Document Reviewed: 02/16/2016 Elsevier Interactive Patient Education  2017 [employer].

## 2017-08-19 ENCOUNTER — Ambulatory Visit: Payer: BLUE CROSS/BLUE SHIELD | Admitting: Podiatry

## 2017-08-19 DIAGNOSIS — F900 Attention-deficit hyperactivity disorder, predominantly inattentive type: Secondary | ICD-10-CM | POA: Diagnosis not present

## 2017-08-19 DIAGNOSIS — F3342 Major depressive disorder, recurrent, in full remission: Secondary | ICD-10-CM | POA: Diagnosis not present

## 2017-08-27 ENCOUNTER — Other Ambulatory Visit: Payer: Self-pay | Admitting: Family Medicine

## 2017-08-29 MED ORDER — BUTALBITAL-ASPIRIN-CAFFEINE 50-325-40 MG PO CAPS: ORAL_CAPSULE | ORAL | 0 refills | Status: DC

## 2017-08-29 NOTE — Telephone Encounter (Signed)
Medication filled to pharmacy as requested.   

## 2017-08-29 NOTE — Telephone Encounter (Signed)
Last OV 08/17/17 (migraine- Gwendolyn Shields) wellbutrin last filled 06/20/17 #90 with 0 Fiorinal last filled 02/21/17 #45 with 0

## 2017-08-30 DIAGNOSIS — G43709 Chronic migraine without aura, not intractable, without status migrainosus: Secondary | ICD-10-CM | POA: Diagnosis not present

## 2017-08-31 ENCOUNTER — Telehealth: Payer: Self-pay | Admitting: *Deleted

## 2017-08-31 NOTE — Telephone Encounter (Signed)
Dr Silverio Decamp and/or Osvaldo Angst,    Pt recently arrived at ED on 08/10/17 with complaints of chest pain x 2 days.  Left before being seen.  Saw pcp a few days later on Dec 3rd.  Does she need an OV with KN? Or cardiac clearance? Or does not qualify for LEC?  Thank you for your assistance, Please advise, Carmie End RN

## 2017-08-31 NOTE — Telephone Encounter (Signed)
She was referred for cardiac stress test by PMD, if stress test negative ok to proceed with the procedure. Thanks

## 2017-09-01 NOTE — Telephone Encounter (Signed)
Butch Penny,  I concur with Dr. Silverio Decamp.  When her stress test is completed, although it appears yet to be scheduled, and found WNL, we can proceed.  Thanks,  Osvaldo Angst

## 2017-09-07 ENCOUNTER — Telehealth: Payer: Self-pay | Admitting: Gastroenterology

## 2017-09-08 ENCOUNTER — Encounter: Payer: BLUE CROSS/BLUE SHIELD | Admitting: Gastroenterology

## 2017-09-08 NOTE — Telephone Encounter (Signed)
Dr. Silverio Decamp is aware that pt is cancelling her procedure for today d/t intolerance of prep.  Pt hasn't had her exercise stress test yet. Spoke with her husband who states she will check to make sure she has cardiac clearance and will call back to schedule appointment.

## 2017-09-08 NOTE — Telephone Encounter (Signed)
Didn't tolerate the prep, switched to Miralax and Gatorade, she could tolerate even that. Has Zofran for prn symptoms. She will call to reschedule.

## 2017-09-13 ENCOUNTER — Encounter: Payer: Self-pay | Admitting: Family Medicine

## 2017-09-15 ENCOUNTER — Telehealth: Payer: Self-pay | Admitting: Obstetrics and Gynecology

## 2017-09-15 ENCOUNTER — Encounter: Payer: Self-pay | Admitting: Family Medicine

## 2017-09-15 ENCOUNTER — Encounter: Payer: Self-pay | Admitting: Obstetrics and Gynecology

## 2017-09-15 NOTE — Telephone Encounter (Signed)
Returned patient's call about not being about to do colonoscopy prep. It made her have N & V and exam was cancelled. She wanted to know if she could do other testing like IFOB, cologuard, virtual colonoscopy. When I returned her call I left her a detailed message, per DPR, stating this was really up to GI Dr. and she should call them to discuss. I will discuss with our provider covering for Dr.Silva to make them aware. Advised her to call us back if any problems or further questions. Routed to [place]

## 2017-09-15 NOTE — Telephone Encounter (Signed)
-----   Message from [place], Generic sent at 09/15/2017 2:21 PM EST -----    Happy New Years. I was wondering is there is a way to order a smear test instead of having the colonoscopy. I had one scheduled over the holiday and the drink made me instantly sick. I violently vomited several times. I know that this is not true idea way to do it but at this time I dont foresee my going in. My husband has already missed a day of work due to the cancellation of the appointment. I will go in for the actual colonoscopy if something comes back on the other test.     Thanks so much   [facility]

## 2017-09-21 ENCOUNTER — Telehealth: Payer: Self-pay | Admitting: Gastroenterology

## 2017-09-22 NOTE — Telephone Encounter (Signed)
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 She would like to be considered for Cologuard. Please advise.

## 2017-09-22 NOTE — Telephone Encounter (Signed)
If has no family history of colon cancer and is an average risk for colorectal cancer screening, ok to proceed with Cologaurd. Patient can request PMD ,Dr Bennye Alm to order it . Thanks

## 2017-09-22 NOTE — Telephone Encounter (Signed)
Patient is advised. She will contact her provider and request a Cologuard test. She is a candidate for this test. Office visit required for it to be ordered through [place].

## 2017-09-22 NOTE — Telephone Encounter (Signed)
Beth, can you please check what prep she was given? Thanks

## 2017-09-27 ENCOUNTER — Other Ambulatory Visit: Payer: Self-pay | Admitting: Family Medicine

## 2017-09-29 ENCOUNTER — Encounter: Payer: Self-pay | Admitting: Family Medicine

## 2017-10-14 DIAGNOSIS — F3342 Major depressive disorder, recurrent, in full remission: Secondary | ICD-10-CM | POA: Diagnosis not present

## 2017-10-16 ENCOUNTER — Other Ambulatory Visit: Payer: Self-pay | Admitting: Physician Assistant

## 2017-10-21 ENCOUNTER — Encounter: Payer: Self-pay | Admitting: Family Medicine

## 2017-11-14 DIAGNOSIS — G43009 Migraine without aura, not intractable, without status migrainosus: Secondary | ICD-10-CM | POA: Diagnosis not present

## 2017-11-18 DIAGNOSIS — I8312 Varicose veins of left lower extremity with inflammation: Secondary | ICD-10-CM | POA: Diagnosis not present

## 2017-11-24 ENCOUNTER — Encounter: Payer: Self-pay | Admitting: Family Medicine

## 2017-11-25 ENCOUNTER — Other Ambulatory Visit: Payer: Self-pay | Admitting: Family Medicine

## 2017-12-01 ENCOUNTER — Encounter: Payer: Self-pay | Admitting: Family Medicine

## 2017-12-09 DIAGNOSIS — I8312 Varicose veins of left lower extremity with inflammation: Secondary | ICD-10-CM | POA: Diagnosis not present

## 2018-01-11 DIAGNOSIS — I8312 Varicose veins of left lower extremity with inflammation: Secondary | ICD-10-CM | POA: Diagnosis not present

## 2018-01-13 ENCOUNTER — Encounter: Payer: Self-pay | Admitting: Podiatry

## 2018-01-13 ENCOUNTER — Ambulatory Visit: Payer: BLUE CROSS/BLUE SHIELD | Admitting: Podiatry

## 2018-01-13 DIAGNOSIS — M2041 Other hammer toe(s) (acquired), right foot: Secondary | ICD-10-CM | POA: Diagnosis not present

## 2018-01-13 DIAGNOSIS — M779 Enthesopathy, unspecified: Secondary | ICD-10-CM

## 2018-01-13 MED ORDER — TRIAMCINOLONE ACETONIDE 10 MG/ML IJ SUSP
10.0000 mg | Freq: Once | INTRAMUSCULAR | Status: AC
  Administered 2018-01-13: 10 mg

## 2018-01-16 NOTE — Progress Notes (Signed)
Subjective:   Patient ID: Gwendolyn Shields, female   DOB: 52 y.o.   MRN: [id number]   HPI Patient is noted to have inflammation pain of the second metatarsal left over right foot and also has digital deformity second digit right with elevation of the toe   ROS      Objective:  Physical Exam  Neurovascular status intact with inflammation which is reoccurred of the second MPJ left over right with fluid buildup within the joint and moderate digital deformity second digit right foot     Assessment:  2 separate problems one being inflammation of the joints second MPJ left and the other one being hammertoe deformity right     Plan:  H&P conditions reviewed and today working to focus on the left even though I did discuss ultimate surgical intervention which may be necessary.  I went ahead today did a proximal nerve block left aspirated the second MPJ getting out a small amount of clear fluid and injected quarter cc dexamethasone Kenalog and I discussed the hammertoe deformity second right and possibility at one point for digital fusion to straighten the toe

## 2018-02-20 ENCOUNTER — Ambulatory Visit: Payer: BLUE CROSS/BLUE SHIELD | Admitting: Family Medicine

## 2018-02-21 ENCOUNTER — Telehealth: Payer: Self-pay | Admitting: Podiatry

## 2018-02-21 NOTE — Telephone Encounter (Signed)
I informed pt that often pt's will go back into their regular shoes after the injection, the shoes often allow the area to get a lot of movement which continues to injure the already irritated area, to go in to a thick or stiff bottom shoe would decrease the irritation to the area and allow it to rest, add ice 15 minutes/session 3-4 times a day protection the skin from the ice with a towel and to make an appt to see Dr. Paulla Dolly to discuss other treatment options. Pt agreed and I transferred pt to schedulers.

## 2018-02-21 NOTE — Telephone Encounter (Signed)
I saw Dr. Paulla Dolly maybe 2-3 weeks ago and received an injection to help with some pain. Now my toe hurts really bad. I don't know if that was to help with that also? If you would please give me a call back at [phone]. Thank you.

## 2018-02-22 ENCOUNTER — Encounter: Payer: Self-pay | Admitting: Podiatry

## 2018-02-22 ENCOUNTER — Ambulatory Visit: Payer: BLUE CROSS/BLUE SHIELD | Admitting: Podiatry

## 2018-02-22 DIAGNOSIS — M779 Enthesopathy, unspecified: Secondary | ICD-10-CM

## 2018-02-22 MED ORDER — DICLOFENAC SODIUM 75 MG PO TBEC: 75.0000 mg | DELAYED_RELEASE_TABLET | Freq: Two times a day (BID) | ORAL | 2 refills | Status: DC

## 2018-02-22 NOTE — Progress Notes (Signed)
Subjective:   Patient ID: Gwendolyn Shields, female   DOB: 52 y.o.   MRN: [id number]   HPI Patient states I am still having discomfort in the second joint but it somewhat better and it seemed like the injection helped but it still bothers   ROS      Objective:  Physical Exam  Neurovascular status intact with patient second MPJ showing mild discomfort in the second joint surface     Assessment:  Continuation of inflammatory condition second MPJ left     Plan:  Reapplied padding start on oral anti-inflammatories and recommended boot usage which she has at home.  Patient will be seen back for [place] to recheck again in 6 weeks and may have to consider shortening osteotomy depending on response placed on diclofenac at this time

## 2018-02-23 ENCOUNTER — Other Ambulatory Visit: Payer: Self-pay | Admitting: Family Medicine

## 2018-02-23 NOTE — Addendum Note (Signed)
Addended by: Harriett Sine D on: 02/23/2018 06:19 PM   Modules accepted: Orders

## 2018-02-27 ENCOUNTER — Other Ambulatory Visit: Payer: Self-pay

## 2018-02-27 ENCOUNTER — Ambulatory Visit: Payer: BLUE CROSS/BLUE SHIELD | Admitting: Family Medicine

## 2018-02-27 ENCOUNTER — Encounter: Payer: Self-pay | Admitting: Family Medicine

## 2018-02-27 DIAGNOSIS — I8392 Asymptomatic varicose veins of left lower extremity: Secondary | ICD-10-CM | POA: Insufficient documentation

## 2018-02-27 DIAGNOSIS — I83812 Varicose veins of left lower extremities with pain: Secondary | ICD-10-CM

## 2018-02-27 NOTE — Assessment & Plan Note (Signed)
New to provider, ongoing for pt.  Her varicose veins have been the source of pain for ~18 months.  She will have pain, itching, swelling.  I agree, she needs to see a vein specialist for treatment of this issue given the amount of time she spends on her feet.  Will follow.

## 2018-02-27 NOTE — Progress Notes (Signed)
   Subjective:    Patient ID: Gwendolyn Shields, female    DOB: [birth year], 52 y.o.   MRN: [id number]  HPI Leg pain- pt has been seeing Dr [facility] of varicose veins.  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  Pt has hx of this but veins were not completely treated at that time and have since recurred and worsened.  Pt is not aware of lower leg swelling but does note large amounts of blood pooling in veins after being on her feet for any period of time.  Veins will itch.  Denies redness or warmth.  Does not feel similar to previous DVT.  Veins will make exercise difficult due to pain, itching.   Review of Systems For ROS see HPI     Objective:   Physical Exam  Constitutional: She appears well-developed and well-nourished. No distress.  Cardiovascular: Intact distal pulses.  Multiple varicosities of L lower leg and posterior to L knee- mild TTP.  No overlying redness/warmth  Musculoskeletal: She exhibits no edema or tenderness.  Skin: Skin is warm and dry. No erythema.  Psychiatric: She has a normal mood and affect. Her behavior is normal. Thought content normal.  Vitals reviewed.         Assessment & Plan:

## 2018-02-27 NOTE — Patient Instructions (Signed)
Follow up as needed or as scheduled I wholly support seeing the vein specialist- this will be very helpful for your leg pain and swelling Call with any questions or concerns Have a great summer!!

## 2018-03-14 ENCOUNTER — Other Ambulatory Visit: Payer: Self-pay | Admitting: Family Medicine

## 2018-03-14 DIAGNOSIS — F331 Major depressive disorder, recurrent, moderate: Secondary | ICD-10-CM | POA: Diagnosis not present

## 2018-03-30 DIAGNOSIS — F331 Major depressive disorder, recurrent, moderate: Secondary | ICD-10-CM | POA: Diagnosis not present

## 2018-04-05 ENCOUNTER — Ambulatory Visit: Payer: BLUE CROSS/BLUE SHIELD | Admitting: Podiatry

## 2018-04-06 DIAGNOSIS — F331 Major depressive disorder, recurrent, moderate: Secondary | ICD-10-CM | POA: Diagnosis not present

## 2018-04-07 DIAGNOSIS — F331 Major depressive disorder, recurrent, moderate: Secondary | ICD-10-CM | POA: Diagnosis not present

## 2018-04-07 DIAGNOSIS — F900 Attention-deficit hyperactivity disorder, predominantly inattentive type: Secondary | ICD-10-CM | POA: Diagnosis not present

## 2018-04-14 DIAGNOSIS — I8312 Varicose veins of left lower extremity with inflammation: Secondary | ICD-10-CM | POA: Diagnosis not present

## 2018-04-17 DIAGNOSIS — F331 Major depressive disorder, recurrent, moderate: Secondary | ICD-10-CM | POA: Diagnosis not present

## 2018-04-21 DIAGNOSIS — N951 Menopausal and female climacteric states: Secondary | ICD-10-CM | POA: Diagnosis not present

## 2018-04-25 DIAGNOSIS — R232 Flushing: Secondary | ICD-10-CM | POA: Diagnosis not present

## 2018-04-25 DIAGNOSIS — N951 Menopausal and female climacteric states: Secondary | ICD-10-CM | POA: Diagnosis not present

## 2018-04-25 DIAGNOSIS — R6882 Decreased libido: Secondary | ICD-10-CM | POA: Diagnosis not present

## 2018-04-25 DIAGNOSIS — G479 Sleep disorder, unspecified: Secondary | ICD-10-CM | POA: Diagnosis not present

## 2018-04-26 ENCOUNTER — Other Ambulatory Visit: Payer: Self-pay | Admitting: Family Medicine

## 2018-05-01 DIAGNOSIS — F331 Major depressive disorder, recurrent, moderate: Secondary | ICD-10-CM | POA: Diagnosis not present

## 2018-05-02 IMAGING — MG DIGITAL SCREENING BILATERAL MAMMOGRAM WITH CAD
4 series · 4 of 4 positions shown · non-contrast
Comparison: Previous exam(s).

CLINICAL DATA: Screening.

EXAM:
DIGITAL SCREENING BILATERAL MAMMOGRAM WITH CAD

[L MLO]
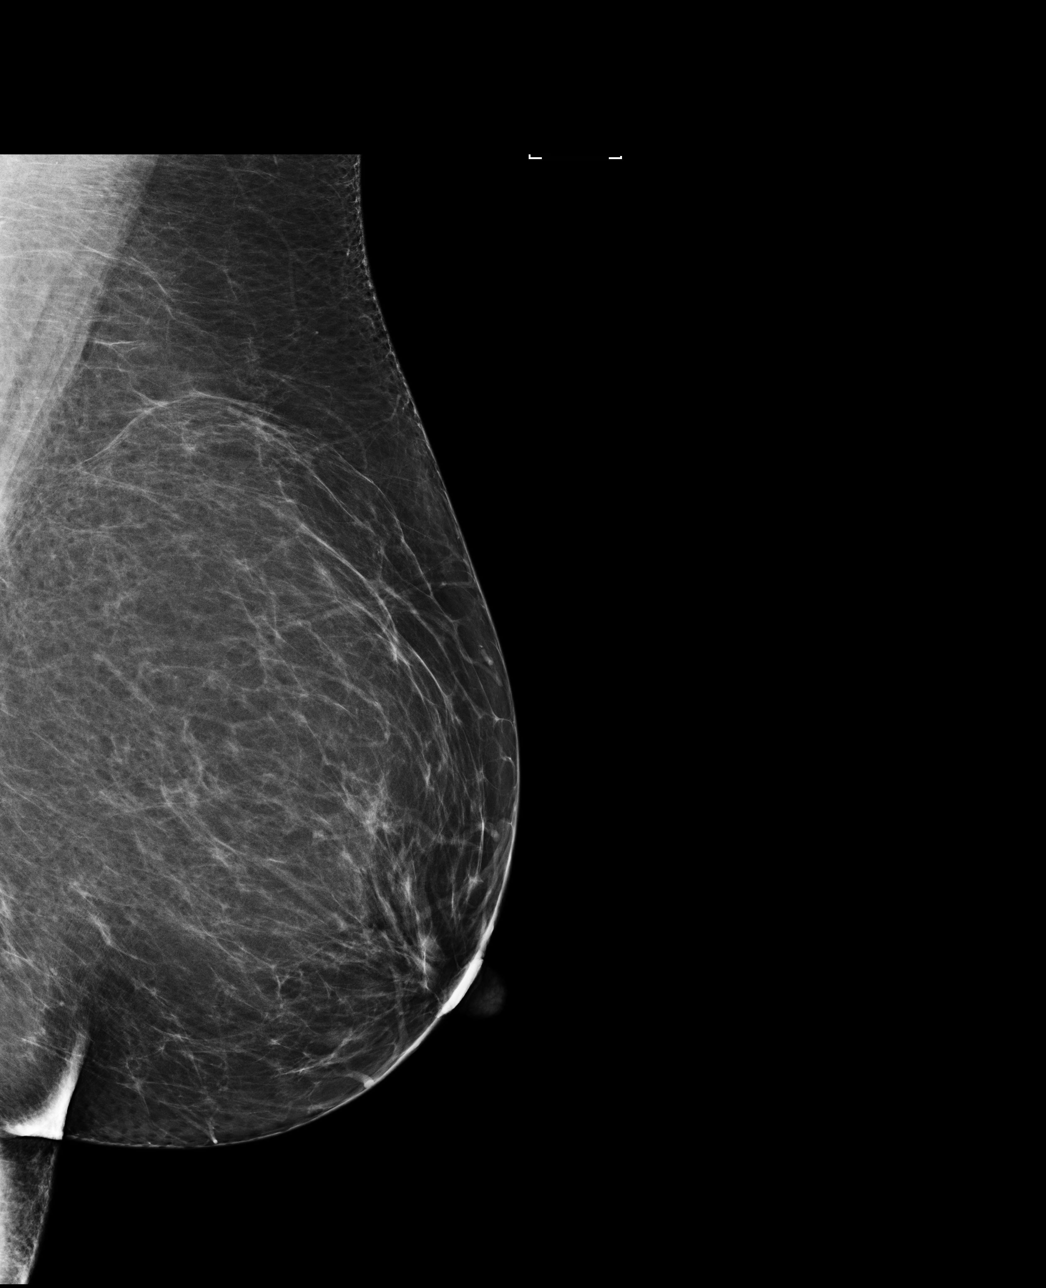

[R CC]
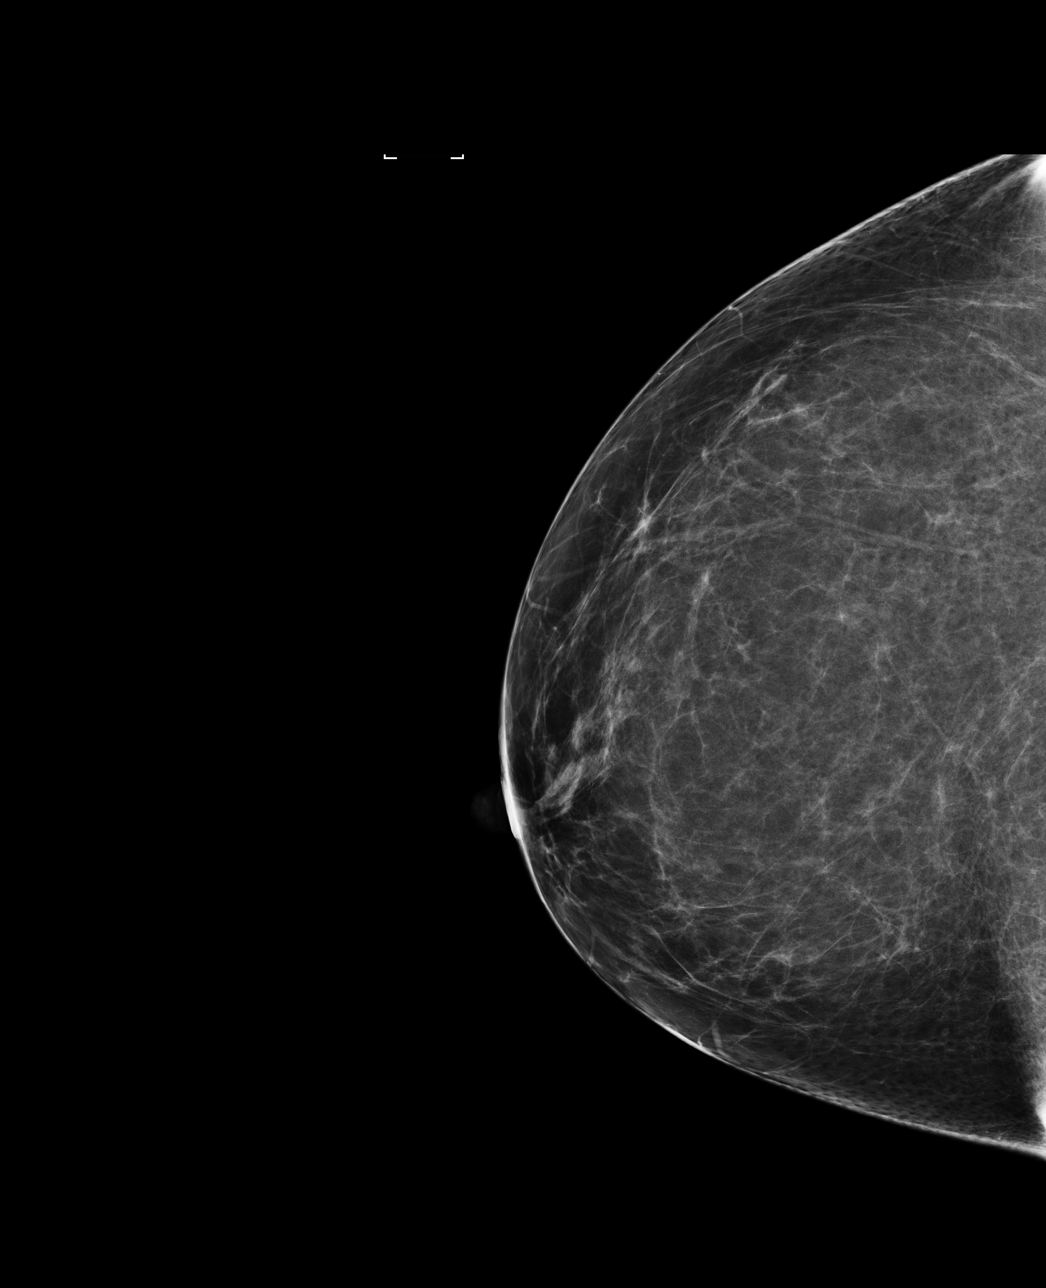

[L CC]
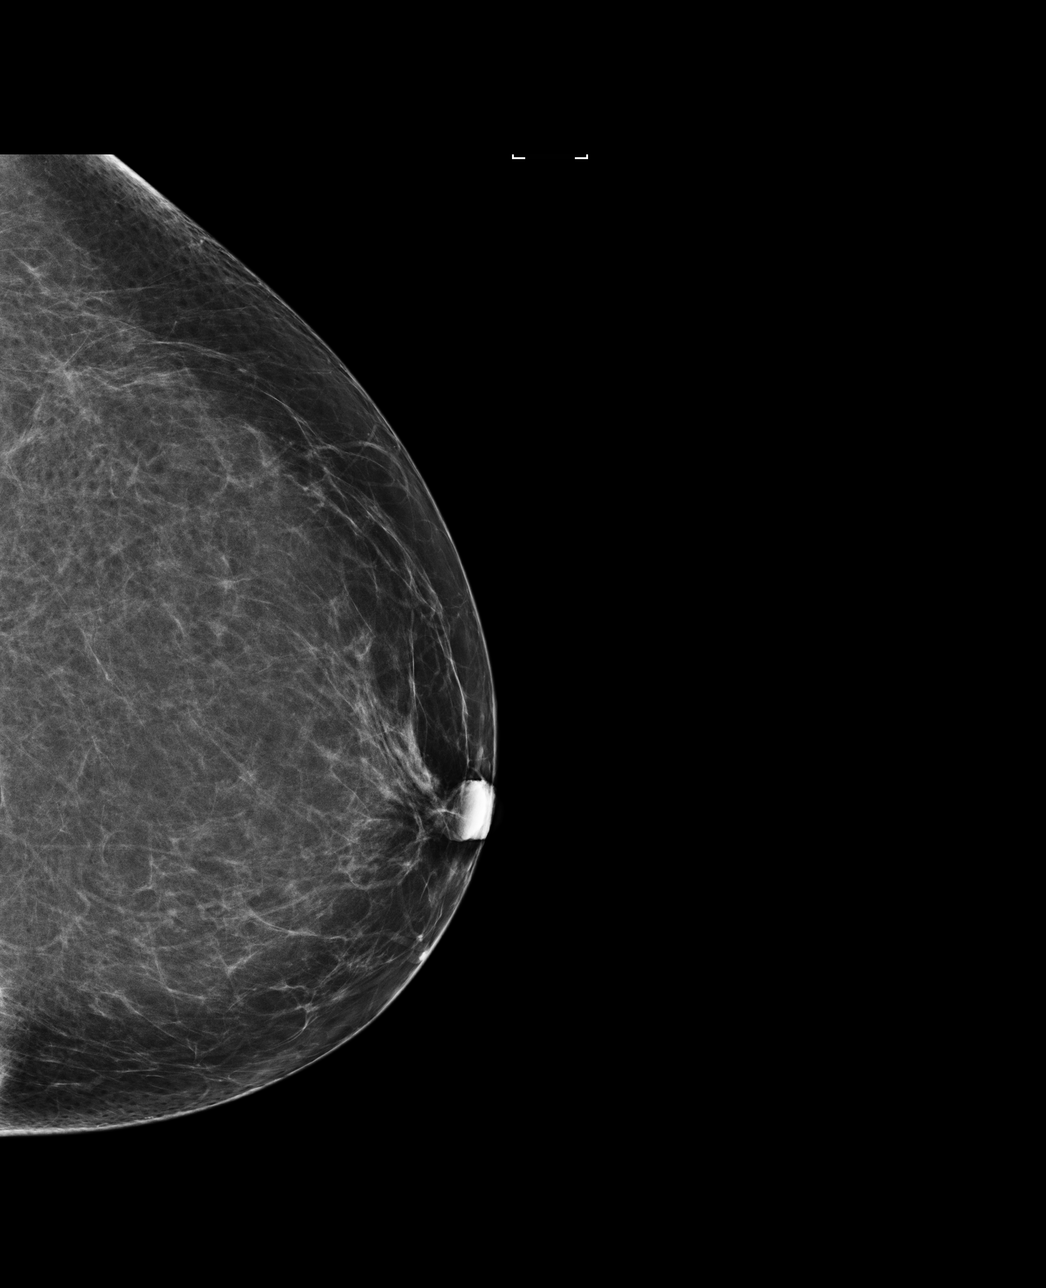

[R MLO]
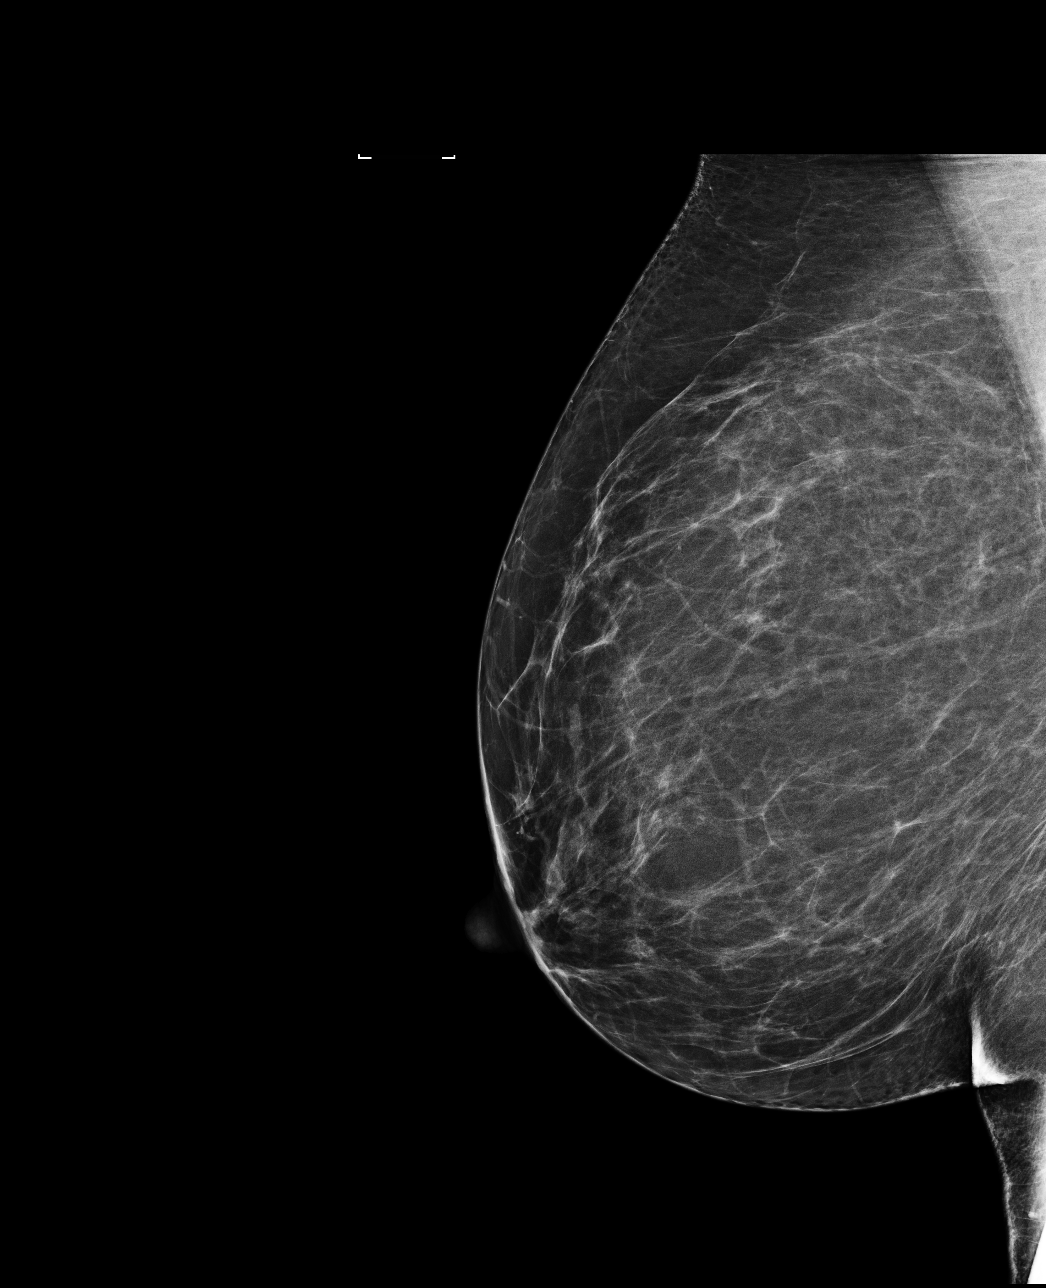

[4 of 4 positions shown; findings below may reference images not displayed]

ACR Breast Density Category b: There are scattered areas of
fibroglandular density.
FINDINGS: There are no findings suspicious for malignancy. Images were
processed with CAD.
IMPRESSION: No mammographic evidence of malignancy. A result letter of this
screening mammogram will be mailed directly to the patient.

RECOMMENDATION:
Screening mammogram in one year. (Code:AS-G-LCT)

BI-RADS CATEGORY  1: Negative.

## 2018-05-06 ENCOUNTER — Other Ambulatory Visit: Payer: Self-pay | Admitting: Family Medicine

## 2018-05-18 NOTE — Progress Notes (Signed)
52 y.o. [id number] Married Caucasian female here for annual exam.    Having hot flashes all the time.  Just stopping Effexor now.  Has been on this for one year.   Using thin pad for urinary incontinence with cough, sneeze.  Not occurring every day.  Some constipation.   Noticing weight gain.   PCP: Dr Birdie Riddle    Patient's last menstrual period was 08/13/2016 (approximate).     Period Cycle (Days): (post menopausal)     Sexually active: No.  The current method of family planning is abstinence.    Exercising: No.  not regularly Smoker:  no  Health Maintenance: Pap:  03/11/2015 normal History of abnormal Pap:  Yes, history of leep 1997.  Normal pap since. MMG:  07/21/2017 BI-RADS CATEGORY  1: Negative Colonoscopy:  Never.   Unable to tolerate her prep.  Wants to Cologuard.  BMD:   n/a  Result  n/a TDaP:  2014 Gardasil:   no HIV: unsure Hep C: unsure Screening Labs: with PCP Hb today: not done, Urine today: n/a   reports that she has never smoked. She has never used smokeless tobacco. She reports that she drinks alcohol. She reports that she does not use drugs.  Past Medical History:  Diagnosis Date  . Clotting disorder ([facility])    dvt in rt.leg 2016  . Depression   . DVT (deep venous thrombosis) ([place])   . Dysplasia of cervix   . Family history of breast cancer   . Hyperlipidemia 08/2014   borderline  . Migraine   . STD (sexually transmitted disease)    HSV 2  . Thyroid disease   . Urinary incontinence     Past Surgical History:  Procedure Laterality Date  . BREAST BIOPSY Left 2001   benign  . CERVICAL BIOPSY  W/ LOOP ELECTRODE EXCISION     19yrs ago  . DILATION AND CURETTAGE OF UTERUS     age 68  . INTRAUTERINE DEVICE (IUD) INSERTION  07/19/2014   Mirena  . IUD REMOVAL      Current Outpatient Medications  Medication Sig Dispense Refill  . buPROPion HCl (WELLBUTRIN PO) Take 400 mg by mouth daily.    . butalbital-aspirin-caffeine (FIORINAL) 50-325-40 MG  capsule TAKE ONE CAPSULE BY MOUTH THREE TIMES DAILY AS NEEDED FOR HEADACHE 45 capsule 0  . diclofenac (VOLTAREN) 75 MG EC tablet Take 1 tablet (75 mg total) by mouth 2 (two) times daily. 50 tablet 2  . eletriptan (RELPAX) 40 MG tablet TAKE 1 TABLET BY MOUTH AS NEEDED FOR HEADACHE/ MIGRAINES(MAY REPEAT IN 2 HOURS IF HEADACHE PERSISTS OR RECURS) 10 tablet 0  . Galcanezumab-gnlm (EMGALITY [place]) Inject into the skin every 30 (thirty) days.    Marland Kitchen glycopyrrolate (ROBINUL) 1 MG tablet Take 1 mg by mouth daily.    Marland Kitchen levothyroxine (SYNTHROID, LEVOTHROID) 112 MCG tablet TAKE 1 TABLET BY MOUTH EVERY DAY 30 tablet 6  . liothyronine (CYTOMEL) 5 MCG tablet Take 10 mcg by mouth daily.    . Multiple Vitamin (MULTIVITAMIN) tablet Take 1 tablet by mouth daily.    . ondansetron (ZOFRAN ODT) 4 MG disintegrating tablet Take 1 tablet (4 mg total) by mouth every 8 (eight) hours as needed for nausea or vomiting. 20 tablet 0  . VYVANSE 70 MG capsule TK 1 C PO QAM  0   No current facility-administered medications for this visit.     Family History  Problem Relation Age of Onset  . Breast cancer Mother 35  recurence of breast cancer early 60's then developed bone cancer ate 75 /then developed bone cancer from recurence age 78  . Heart failure Father 74  . Hypertension Father   . Stroke Maternal Grandmother   . Diabetes Maternal Grandfather   . Diabetes Paternal Grandmother   . Cervical cancer Sister 62  . Colon cancer Neg Hx   . Colon polyps Neg Hx   . Esophageal cancer Neg Hx   . Rectal cancer Neg Hx   . Stomach cancer Neg Hx     Review of Systems  Constitutional: Positive for unexpected weight change.  HENT: Negative.   Eyes: Negative.   Respiratory: Negative.   Cardiovascular: Negative.   Gastrointestinal: Positive for constipation.  Endocrine: Negative.   Genitourinary: Negative.   Musculoskeletal: Negative.   Skin: Negative.   Allergic/Immunologic: Negative.   Neurological: Negative.    Hematological: Negative.   Psychiatric/Behavioral: Positive for dysphoric mood.  All other systems reviewed and are negative.   Exam:   BP 132/88   Pulse 84   Resp 14   Ht 5' 6.25" (1.683 m)   Wt 218 lb (98.9 kg)   LMP 08/13/2016 (Approximate)   BMI 34.92 kg/m     General appearance: alert, cooperative and appears stated age Head: Normocephalic, without obvious abnormality, atraumatic Neck: no adenopathy, supple, symmetrical, trachea midline and thyroid normal to inspection and palpation Lungs: clear to auscultation bilaterally Breasts: normal appearance, no masses or tenderness, No nipple retraction or dimpling, No nipple discharge or bleeding, No axillary or supraclavicular adenopathy Heart: regular rate and rhythm Abdomen: soft, non-tender; no masses, no organomegaly Extremities: extremities normal, atraumatic, no cyanosis or edema Skin: Skin color, texture, turgor normal. No rashes or lesions Lymph nodes: Cervical, supraclavicular, and axillary nodes normal. No abnormal inguinal nodes palpated Neurologic: Grossly normal  Pelvic: External genitalia:  no lesions              Urethra:  normal appearing urethra with no masses, tenderness or lesions              Bartholins and Skenes: normal                 Vagina: normal appearing vagina with normal color and discharge, no lesions              Cervix: no lesions              Pap taken: Yes.   Bimanual Exam:  Uterus:  normal size, contour, position, consistency, mobility, non-tender              Adnexa: no mass, fullness, tenderness              Rectal exam: Yes.  .  Confirms.              Anus:  normal sphincter tone,  Hemorrhoid.   Chaperone was present for exam.  Assessment:   Well woman visit with normal exam. Menopausal symptoms.  FH of breast and possible uterine cancer.   Hx DVT. Mild GSI.  Hx HSV 2.  Plan: Mammogram screening. Recommended self breast awareness. Pap and HR HPV as above. Guidelines for  Calcium, Vitamin D, regular exercise program including cardiovascular and weight bearing exercise. Order Cologuard.  Neurontin for hot flashes.  Instructed in use.  Side effects reviewed. She will report back how she is doing in her third month of use.  Labs with PCP.  Follow up annually and prn.   After visit summary  provided.

## 2018-05-19 ENCOUNTER — Encounter: Payer: Self-pay | Admitting: Obstetrics and Gynecology

## 2018-05-19 ENCOUNTER — Ambulatory Visit: Payer: BLUE CROSS/BLUE SHIELD | Admitting: Obstetrics and Gynecology

## 2018-05-19 ENCOUNTER — Other Ambulatory Visit (HOSPITAL_COMMUNITY)
Admission: RE | Admit: 2018-05-19 | Discharge: 2018-05-19 | Disposition: A | Discharge status: Home / Self Care | Payer: BLUE CROSS/BLUE SHIELD | Source: Ambulatory Visit | Attending: Obstetrics and Gynecology | Admitting: Obstetrics and Gynecology

## 2018-05-19 VITALS — BP 132/88 | HR 84 | Resp 14 | Ht 66.25 in | Wt 218.0 lb

## 2018-05-19 DIAGNOSIS — Z1151 Encounter for screening for human papillomavirus (HPV): Secondary | ICD-10-CM | POA: Insufficient documentation

## 2018-05-19 DIAGNOSIS — F331 Major depressive disorder, recurrent, moderate: Secondary | ICD-10-CM | POA: Diagnosis not present

## 2018-05-19 DIAGNOSIS — N951 Menopausal and female climacteric states: Secondary | ICD-10-CM | POA: Diagnosis not present

## 2018-05-19 DIAGNOSIS — Z01419 Encounter for gynecological examination (general) (routine) without abnormal findings: Secondary | ICD-10-CM | POA: Insufficient documentation

## 2018-05-19 MED ORDER — GABAPENTIN 100 MG PO CAPS: ORAL_CAPSULE | ORAL | 2 refills | Status: DC

## 2018-05-19 NOTE — Patient Instructions (Signed)
EXERCISE AND DIET:  We recommended that you start or continue a regular exercise program for good health. Regular exercise means any activity that makes your heart beat faster and makes you sweat.  We recommend exercising at least 30 minutes per day at least 3 days a week, preferably 4 or 5.  We also recommend a diet low in fat and sugar.  Inactivity, poor dietary choices and obesity can cause diabetes, heart attack, stroke, and kidney damage, among others.    ALCOHOL AND SMOKING:  Women should limit their alcohol intake to no more than 7 drinks/beers/glasses of wine (combined, not each!) per week. Moderation of alcohol intake to this level decreases your risk of breast cancer and liver damage. And of course, no recreational drugs are part of a healthy lifestyle.  And absolutely no smoking or even second hand smoke. Most people know smoking can cause heart and lung diseases, but did you know it also contributes to weakening of your bones? Aging of your skin?  Yellowing of your teeth and nails?  CALCIUM AND VITAMIN D:  Adequate intake of calcium and Vitamin D are recommended.  The recommendations for exact amounts of these supplements seem to change often, but generally speaking 600 mg of calcium (either carbonate or citrate) and 800 units of Vitamin D per day seems prudent. Certain women may benefit from higher intake of Vitamin D.  If you are among these women, your doctor will have told you during your visit.    PAP SMEARS:  Pap smears, to check for cervical cancer or precancers,  have traditionally been done yearly, although recent scientific advances have shown that most women can have pap smears less often.  However, every woman still should have a physical exam from her gynecologist every year. It will include a breast check, inspection of the vulva and vagina to check for abnormal growths or skin changes, a visual exam of the cervix, and then an exam to evaluate the size and shape of the uterus and  ovaries.  And after 53 years of age, a rectal exam is indicated to check for rectal cancers. We will also provide age appropriate advice regarding health maintenance, like when you should have certain vaccines, screening for sexually transmitted diseases, bone density testing, colonoscopy, mammograms, etc.   MAMMOGRAMS:  All women over 40 years old should have a yearly mammogram. Many facilities now offer a "3D" mammogram, which may cost around $50 extra out of pocket. If possible,  we recommend you accept the option to have the 3D mammogram performed.  It both reduces the number of women who will be called back for extra views which then turn out to be normal, and it is better than the routine mammogram at detecting truly abnormal areas.    COLONOSCOPY:  Colonoscopy to screen for colon cancer is recommended for all women at age 50.  We know, you hate the idea of the prep.  We agree, BUT, having colon cancer and not knowing it is worse!!  Colon cancer so often starts as a polyp that can be seen and removed at colonscopy, which can quite literally save your life!  And if your first colonoscopy is normal and you have no family history of colon cancer, most women don't have to have it again for 10 years.  Once every ten years, you can do something that may end up saving your life, right?  We will be happy to help you get it scheduled when you are ready.    Be sure to check your insurance coverage so you understand how much it will cost.  It may be covered as a preventative service at no cost, but you should check your particular policy.     Gabapentin capsules or tablets What is this medicine? GABAPENTIN (GA ba pen tin) is used to control partial seizures in adults with epilepsy. It is also used to treat certain types of nerve pain. This medicine may be used for other purposes; ask your health care provider or pharmacist if you have questions. COMMON BRAND NAME(S): Active-PAC with Gabapentin, Gabarone,  Neurontin What should I tell my health care provider before I take this medicine? They need to know if you have any of these conditions: -kidney disease -suicidal thoughts, plans, or attempt; a previous suicide attempt by you or a family member -an unusual or allergic reaction to gabapentin, other medicines, foods, dyes, or preservatives -pregnant or trying to get pregnant -breast-feeding How should I use this medicine? Take this medicine by mouth with a glass of water. Follow the directions on the prescription label. You can take it with or without food. If it upsets your stomach, take it with food.Take your medicine at regular intervals. Do not take it more often than directed. Do not stop taking except on your doctor's advice. If you are directed to break the 600 or 800 mg tablets in half as part of your dose, the extra half tablet should be used for the next dose. If you have not used the extra half tablet within 28 days, it should be thrown away. A special MedGuide will be given to you by the pharmacist with each prescription and refill. Be sure to read this information carefully each time. Talk to your pediatrician regarding the use of this medicine in children. Special care may be needed. Overdosage: If you think you have taken too much of this medicine contact a poison control center or emergency room at once. NOTE: This medicine is only for you. Do not share this medicine with others. What if I miss a dose? If you miss a dose, take it as soon as you can. If it is almost time for your next dose, take only that dose. Do not take double or extra doses. What may interact with this medicine? Do not take this medicine with any of the following medications: -other gabapentin products This medicine may also interact with the following medications: -alcohol -antacids -antihistamines for allergy, cough and cold -certain medicines for anxiety or sleep -certain medicines for depression or  psychotic disturbances -homatropine; hydrocodone -naproxen -narcotic medicines (opiates) for pain -phenothiazines like chlorpromazine, mesoridazine, prochlorperazine, thioridazine This list may not describe all possible interactions. Give your health care provider a list of all the medicines, herbs, non-prescription drugs, or dietary supplements you use. Also tell them if you smoke, drink alcohol, or use illegal drugs. Some items may interact with your medicine. What should I watch for while using this medicine? Visit your doctor or health care professional for regular checks on your progress. You may want to keep a record at home of how you feel your condition is responding to treatment. You may want to share this information with your doctor or health care professional at each visit. You should contact your doctor or health care professional if your seizures get worse or if you have any new types of seizures. Do not stop taking this medicine or any of your seizure medicines unless instructed by your doctor or health care professional. Stopping  your medicine suddenly can increase your seizures or their severity. Wear a medical identification bracelet or chain if you are taking this medicine for seizures, and carry a card that lists all your medications. You may get drowsy, dizzy, or have blurred vision. Do not drive, use machinery, or do anything that needs mental alertness until you know how this medicine affects you. To reduce dizzy or fainting spells, do not sit or stand up quickly, especially if you are an older patient. Alcohol can increase drowsiness and dizziness. Avoid alcoholic drinks. Your mouth may get dry. Chewing sugarless gum or sucking hard candy, and drinking plenty of water will help. The use of this medicine may increase the chance of suicidal thoughts or actions. Pay special attention to how you are responding while on this medicine. Any worsening of mood, or thoughts of suicide or  dying should be reported to your health care professional right away. Women who become pregnant while using this medicine may enroll in the [place] Pregnancy Registry by calling [id number]. This registry collects information about the safety of antiepileptic drug use during pregnancy. What side effects may I notice from receiving this medicine? Side effects that you should report to your doctor or health care professional as soon as possible: -allergic reactions like skin rash, itching or hives, swelling of the face, lips, or tongue -worsening of mood, thoughts or actions of suicide or dying Side effects that usually do not require medical attention (report to your doctor or health care professional if they continue or are bothersome): -constipation -difficulty walking or controlling muscle movements -dizziness -nausea -slurred speech -tiredness -tremors -weight gain This list may not describe all possible side effects. Call your doctor for medical advice about side effects. You may report side effects to FDA at 1-800-FDA-1088. Where should I keep my medicine? Keep out of reach of children. This medicine may cause accidental overdose and death if it taken by other adults, children, or pets. Mix any unused medicine with a substance like cat litter or coffee grounds. Then throw the medicine away in a sealed container like a sealed bag or a coffee can with a lid. Do not use the medicine after the expiration date. Store at room temperature between 15 and 30 degrees C (59 and 86 degrees F). NOTE: This sheet is a summary. It may not cover all possible information. If you have questions about this medicine, talk to your doctor, pharmacist, or health care provider.  2018 Elsevier/Gold Standard (2013-10-26 15:26:50)

## 2018-05-19 NOTE — Progress Notes (Unsigned)
Order for cologaurd faxed to [phone].

## 2018-05-20 IMAGING — DX DG CHEST 2V
2 series · 2 of 2 positions shown · non-contrast
Comparison: 07/20/2013

CLINICAL DATA: Chest pain

EXAM:
CHEST  2 VIEW

[chest pa]
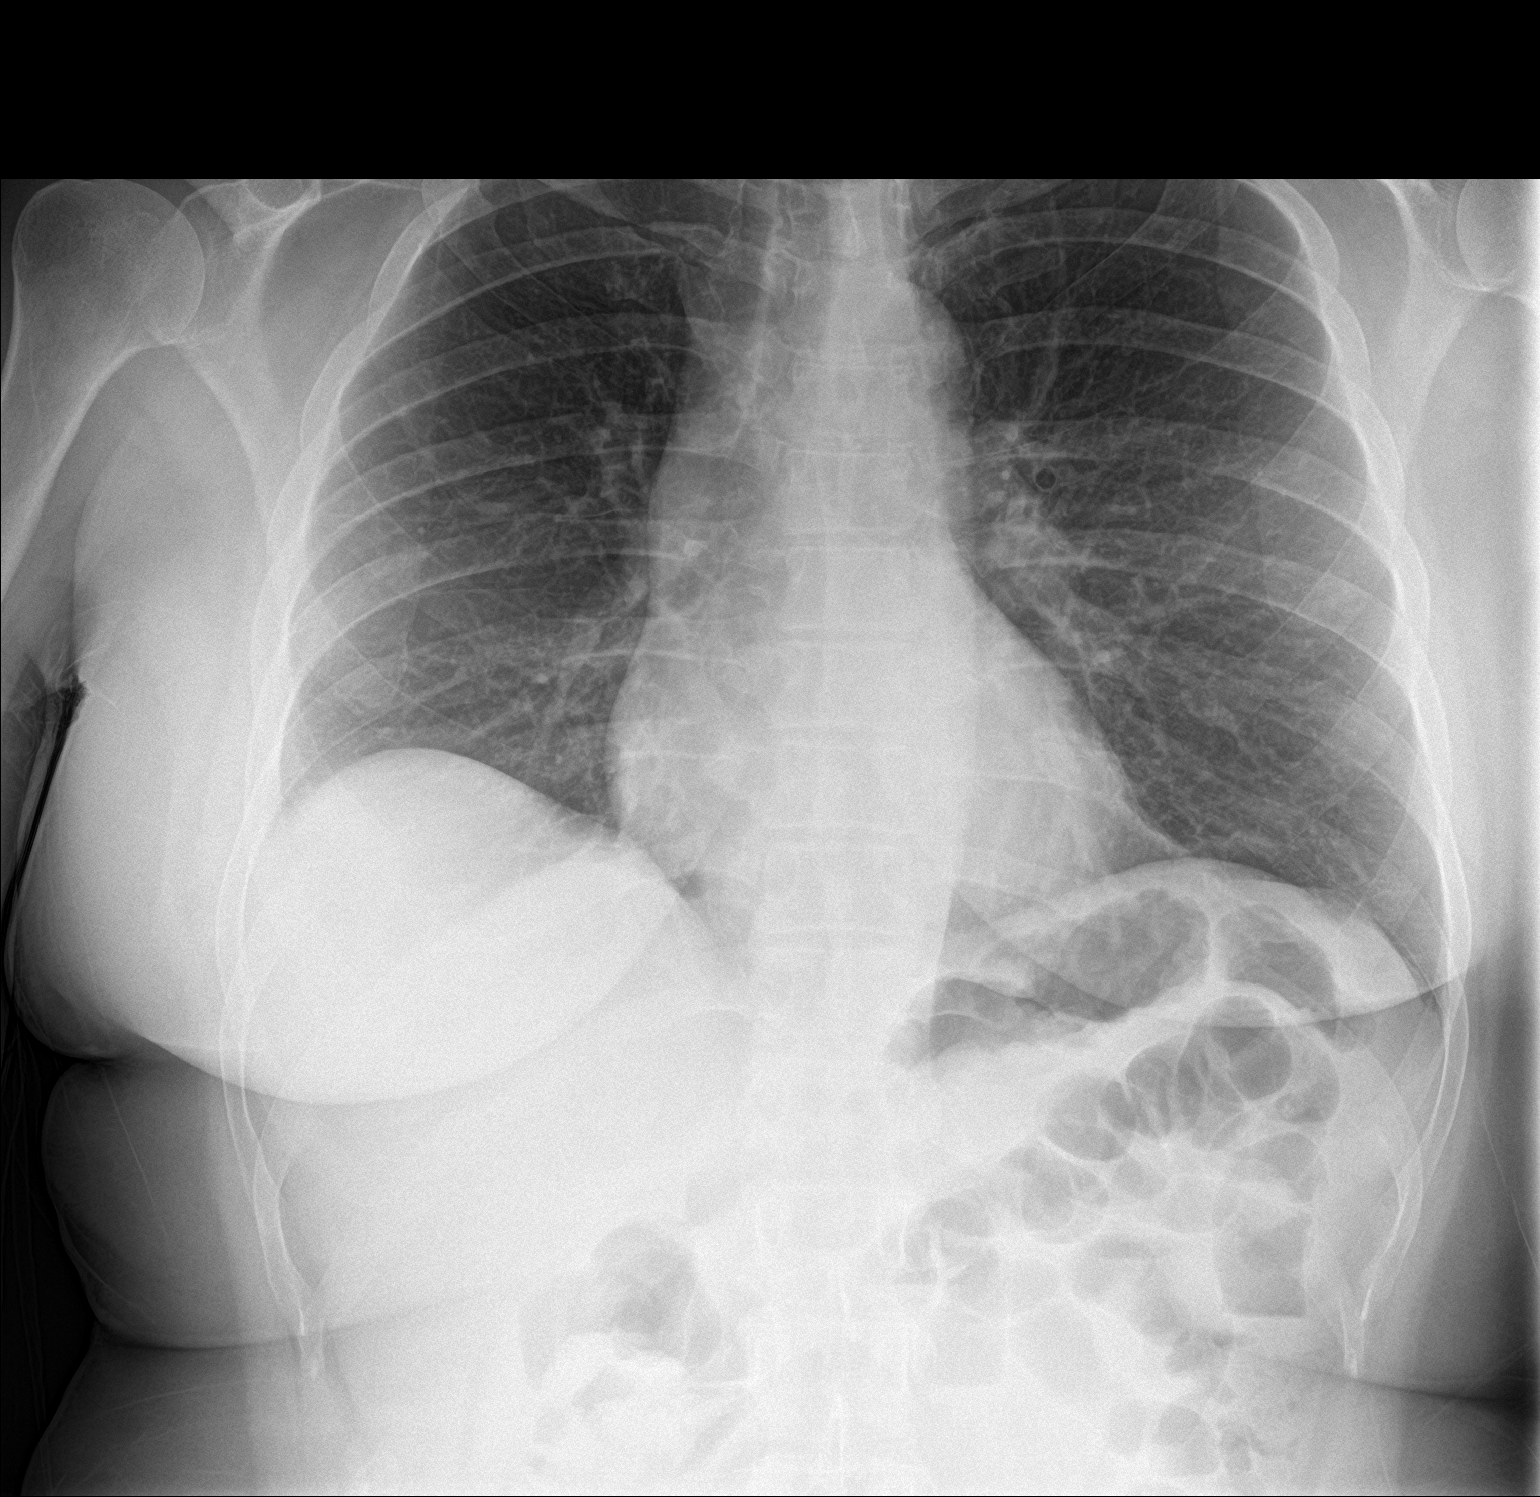

[chest lat]
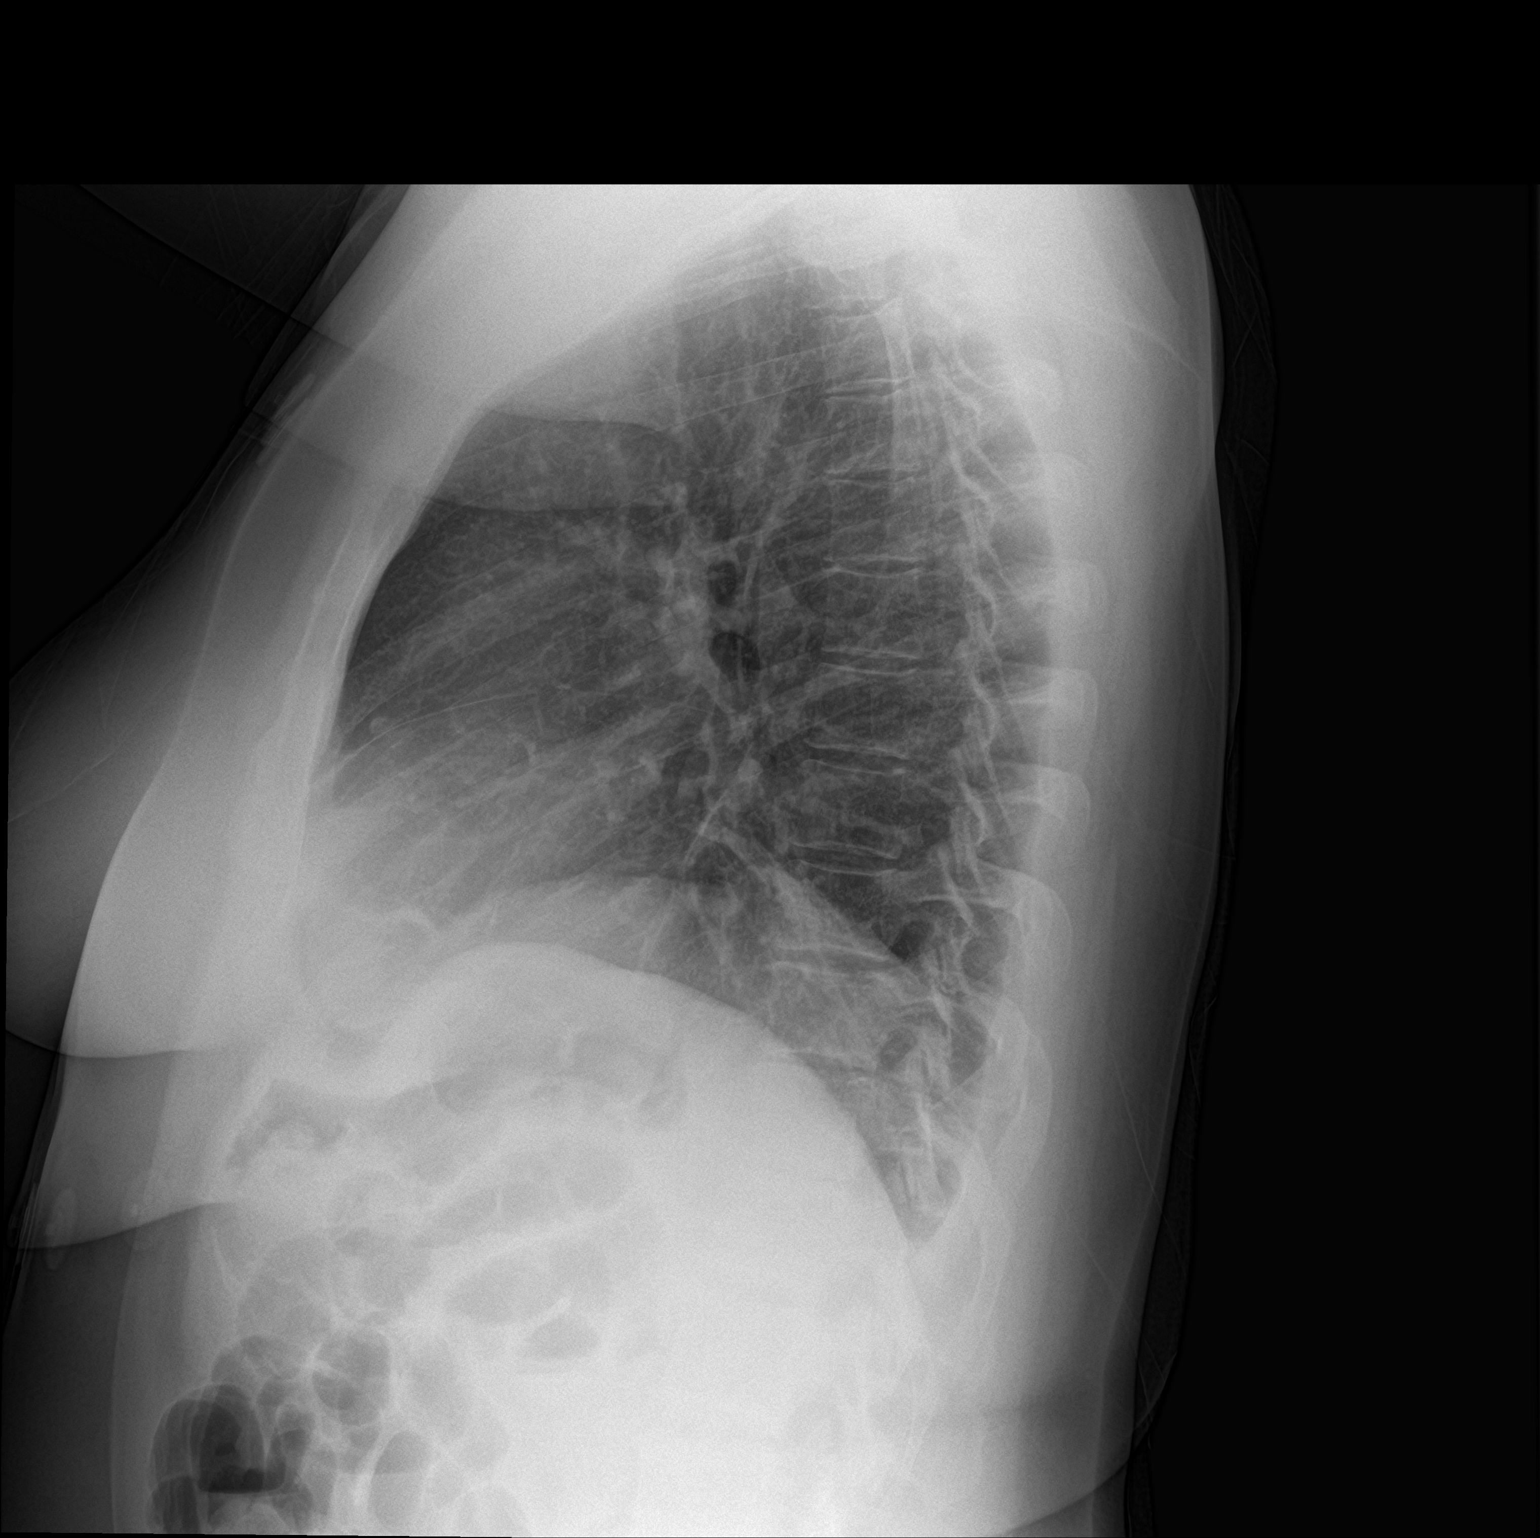

[2 of 2 positions shown; findings below may reference images not displayed]

FINDINGS: The heart size and mediastinal contours are within normal limits.
Both lungs are clear. The visualized skeletal structures are
unremarkable.
IMPRESSION: No active cardiopulmonary disease.

## 2018-05-22 DIAGNOSIS — G479 Sleep disorder, unspecified: Secondary | ICD-10-CM | POA: Diagnosis not present

## 2018-05-22 DIAGNOSIS — R6882 Decreased libido: Secondary | ICD-10-CM | POA: Diagnosis not present

## 2018-05-22 DIAGNOSIS — R232 Flushing: Secondary | ICD-10-CM | POA: Diagnosis not present

## 2018-05-22 DIAGNOSIS — N951 Menopausal and female climacteric states: Secondary | ICD-10-CM | POA: Diagnosis not present

## 2018-05-23 DIAGNOSIS — E039 Hypothyroidism, unspecified: Secondary | ICD-10-CM | POA: Diagnosis not present

## 2018-05-23 DIAGNOSIS — N951 Menopausal and female climacteric states: Secondary | ICD-10-CM | POA: Diagnosis not present

## 2018-05-23 DIAGNOSIS — H524 Presbyopia: Secondary | ICD-10-CM | POA: Diagnosis not present

## 2018-05-23 DIAGNOSIS — R232 Flushing: Secondary | ICD-10-CM | POA: Diagnosis not present

## 2018-05-23 DIAGNOSIS — R6882 Decreased libido: Secondary | ICD-10-CM | POA: Diagnosis not present

## 2018-05-29 ENCOUNTER — Encounter: Payer: Self-pay | Admitting: Family Medicine

## 2018-05-29 ENCOUNTER — Other Ambulatory Visit: Payer: Self-pay

## 2018-05-29 ENCOUNTER — Ambulatory Visit: Payer: BLUE CROSS/BLUE SHIELD | Admitting: Family Medicine

## 2018-05-29 VITALS — BP 126/82 | HR 86 | Temp 98.1°F | Resp 16 | Ht 66.0 in | Wt 215.4 lb

## 2018-05-29 DIAGNOSIS — R0789 Other chest pain: Secondary | ICD-10-CM | POA: Diagnosis not present

## 2018-05-29 DIAGNOSIS — Z23 Encounter for immunization: Secondary | ICD-10-CM | POA: Diagnosis not present

## 2018-05-29 LAB — CYTOLOGY - PAP
DIAGNOSIS: NEGATIVE
HPV: NOT DETECTED

## 2018-05-29 MED ORDER — OMEPRAZOLE 40 MG PO CPDR
40.0000 mg | DELAYED_RELEASE_CAPSULE | Freq: Every day | ORAL | 3 refills | Status: DC
Start: 2018-05-29 — End: 2018-10-05

## 2018-05-29 NOTE — Patient Instructions (Signed)
Follow up in 30 days to recheck chest tightness (sooner if needed) START the Omeprazole once daily to decrease acid production LIMIT your amount of caffeine, alcohol, spicy or acidic food- as this can worsen acid production If the chest tightness changes/worsens- let us know immediately or go to the ER Call with any questions or concerns Hang in there!!!

## 2018-05-29 NOTE — Progress Notes (Signed)
   Subjective:    Patient ID: Gwendolyn Shields, female    DOB: 27-Dec-1965, 52 y.o.   MRN: [id number]  HPI 'pain in my chest'- sxs will occur under sternum and at times radiate up into jaw, 'like you've eaten something sour'.  Can last up to 5 minutes.  Last episode was ~1 hr ago.  Occurring 1-2x/week.  Occur both at work and at home.  One woke her from sleep last night.  The one at work occurred w/ eating.  sxs prompted ER evaluation ~10 months ago.  Denies GERD 'but I will take tums every now and then'.  sxs will resolve spontaneously and completely.  Denies associated SOB.  Recent insertion of testosterone pellets but denies leg swelling, pain, tachycardia.   Review of Systems For ROS see HPI     Objective:   Physical Exam  Constitutional: She is oriented to person, place, and time. She appears well-developed and well-nourished. No distress.  HENT:  Head: Normocephalic and atraumatic.  Eyes: Pupils are equal, round, and reactive to light. Conjunctivae and EOM are normal.  Neck: Normal range of motion. Neck supple. No thyromegaly present.  Cardiovascular: Normal rate, regular rhythm, normal heart sounds and intact distal pulses.  No murmur heard. Pulmonary/Chest: Effort normal and breath sounds normal. No respiratory distress.  Abdominal: Soft. She exhibits no distension. There is no tenderness.  Musculoskeletal: She exhibits no edema.  Lymphadenopathy:    She has no cervical adenopathy.  Neurological: She is alert and oriented to person, place, and time.  Skin: Skin is warm and dry.  Psychiatric: She has a normal mood and affect. Her behavior is normal.  Vitals reviewed.         Assessment & Plan:  Chest tightness- pt has been having sxs intermittently since November which prompted her to go to the ER but leave before being seen due to wait time.  Sxs do not occur w/ exertion and tend to either wake her from sleep or occur around mealtimes- suggesting GI process (esophageal spasm,  GERD, etc) and making cardiac issue less likely.  Wells score is 0- making PE very unlikely.  EKG unchanged from previous.  Start PPI.  Reviewed dietary modifications.  Red flags given that should prompt immediate attention.  Pt expressed understanding and is in agreement w/ plan.

## 2018-06-05 ENCOUNTER — Telehealth: Payer: Self-pay | Admitting: General Practice

## 2018-06-05 ENCOUNTER — Other Ambulatory Visit: Payer: Self-pay | Admitting: General Practice

## 2018-06-05 MED ORDER — BUPROPION HCL ER (XL) 300 MG PO TB24: 300.0000 mg | ORAL_TABLET | Freq: Every day | ORAL | 1 refills | Status: DC

## 2018-06-05 NOTE — Telephone Encounter (Signed)
Bureau for refill of Bupropion XL 300mg , #90, 1 refill

## 2018-06-05 NOTE — Telephone Encounter (Signed)
Medication filled to pharmacy as requested.   

## 2018-06-05 NOTE — Telephone Encounter (Signed)
Received a fax form the pharmacy asking for a refill of pt bupropion XL 300mg . Please advise per our chart she is currently on 400mg  (not XL)

## 2018-06-09 DIAGNOSIS — F331 Major depressive disorder, recurrent, moderate: Secondary | ICD-10-CM | POA: Diagnosis not present

## 2018-06-14 DIAGNOSIS — F331 Major depressive disorder, recurrent, moderate: Secondary | ICD-10-CM | POA: Diagnosis not present

## 2018-06-18 DIAGNOSIS — Z1212 Encounter for screening for malignant neoplasm of rectum: Secondary | ICD-10-CM | POA: Diagnosis not present

## 2018-06-18 DIAGNOSIS — Z1211 Encounter for screening for malignant neoplasm of colon: Secondary | ICD-10-CM | POA: Diagnosis not present

## 2018-06-21 DIAGNOSIS — F331 Major depressive disorder, recurrent, moderate: Secondary | ICD-10-CM | POA: Diagnosis not present

## 2018-06-23 LAB — COLOGUARD: Cologuard: NEGATIVE

## 2018-06-26 ENCOUNTER — Ambulatory Visit: Payer: BLUE CROSS/BLUE SHIELD | Admitting: Family Medicine

## 2018-06-26 ENCOUNTER — Encounter: Payer: Self-pay | Admitting: Family Medicine

## 2018-06-26 ENCOUNTER — Other Ambulatory Visit: Payer: Self-pay

## 2018-06-26 VITALS — BP 110/78 | HR 82 | Temp 98.5°F | Resp 14 | Ht 66.0 in | Wt 216.0 lb

## 2018-06-26 DIAGNOSIS — F32A Depression, unspecified: Secondary | ICD-10-CM

## 2018-06-26 DIAGNOSIS — R0789 Other chest pain: Secondary | ICD-10-CM | POA: Diagnosis not present

## 2018-06-26 DIAGNOSIS — F329 Major depressive disorder, single episode, unspecified: Secondary | ICD-10-CM | POA: Diagnosis not present

## 2018-06-26 NOTE — Assessment & Plan Note (Signed)
Stable.  Pt is under considerable stress but handling it fairly well.  No med changes at this time but will continue to follow.

## 2018-06-26 NOTE — Patient Instructions (Signed)
Schedule your complete physical in 3-4 months No med changes at this time Continue the Omeprazole Call with any questions or concerns Happy Early Rudene Anda!!!

## 2018-06-26 NOTE — Progress Notes (Signed)
   Subjective:    Patient ID: Gwendolyn Shields, female    DOB: 01-04-1966, 52 y.o.   MRN: [id number]  HPI Chest tightness- was started on Omeprazole at last visit.  Pt reports chest tightness has improved.  Only 1 episode in the last month.  No longer waking up or having substernal chest tightness.  No SOB, palpitations.  Continues to take Omeprazole 40mg  daily.  Denies abd pain, N/V.  Anxiety level has been high as older daughter had a seizure while away at college- 1st seizure.  Pt is having an understandably hard time with this.   Review of Systems For ROS see HPI     Objective:   Physical Exam  Constitutional: She appears well-developed and well-nourished. No distress.  HENT:  Head: Normocephalic and atraumatic.  Neck: Normal range of motion. Neck supple. No thyromegaly present.  Cardiovascular: Normal rate, regular rhythm and intact distal pulses. Exam reveals no friction rub.  No murmur heard. Pulmonary/Chest: Effort normal and breath sounds normal. No stridor. No respiratory distress. She has no wheezes. She has no rales.  Lymphadenopathy:    She has no cervical adenopathy.  Neurological: She is alert.  Skin: Skin is warm and dry.  Psychiatric: She has a normal mood and affect. Her behavior is normal. Thought content normal.  Vitals reviewed.         Assessment & Plan:  Chest tightness- improved w/ addition of Omeprazole daily.  Again discussed dietary and lifestyle modifications.  No med changes at this time and will continue to follow.

## 2018-06-30 DIAGNOSIS — I8312 Varicose veins of left lower extremity with inflammation: Secondary | ICD-10-CM | POA: Diagnosis not present

## 2018-07-03 ENCOUNTER — Other Ambulatory Visit: Payer: Self-pay | Admitting: Obstetrics and Gynecology

## 2018-07-03 DIAGNOSIS — Z1231 Encounter for screening mammogram for malignant neoplasm of breast: Secondary | ICD-10-CM

## 2018-07-04 ENCOUNTER — Telehealth: Payer: Self-pay | Admitting: Obstetrics and Gynecology

## 2018-07-04 DIAGNOSIS — I8312 Varicose veins of left lower extremity with inflammation: Secondary | ICD-10-CM | POA: Diagnosis not present

## 2018-07-04 NOTE — Telephone Encounter (Signed)
Please inform patient of her negative Cologuard result.  This will be scanned into her medical record.

## 2018-07-05 NOTE — Telephone Encounter (Signed)
Spoke with patient and notified cologuard negative.

## 2018-07-18 DIAGNOSIS — I8312 Varicose veins of left lower extremity with inflammation: Secondary | ICD-10-CM | POA: Diagnosis not present

## 2018-07-31 DIAGNOSIS — G43009 Migraine without aura, not intractable, without status migrainosus: Secondary | ICD-10-CM | POA: Diagnosis not present

## 2018-08-14 ENCOUNTER — Ambulatory Visit: Payer: BLUE CROSS/BLUE SHIELD

## 2018-08-29 DIAGNOSIS — I8312 Varicose veins of left lower extremity with inflammation: Secondary | ICD-10-CM | POA: Diagnosis not present

## 2018-08-30 ENCOUNTER — Other Ambulatory Visit: Payer: Self-pay | Admitting: *Deleted

## 2018-08-30 ENCOUNTER — Encounter: Payer: Self-pay | Admitting: Obstetrics and Gynecology

## 2018-08-30 DIAGNOSIS — Z1212 Encounter for screening for malignant neoplasm of rectum: Secondary | ICD-10-CM

## 2018-08-30 DIAGNOSIS — Z1211 Encounter for screening for malignant neoplasm of colon: Secondary | ICD-10-CM

## 2018-09-11 ENCOUNTER — Ambulatory Visit
Admission: RE | Admit: 2018-09-11 | Discharge: 2018-09-11 | Disposition: A | Discharge status: Home / Self Care | Payer: BLUE CROSS/BLUE SHIELD | Source: Ambulatory Visit | Attending: Obstetrics and Gynecology | Admitting: Obstetrics and Gynecology

## 2018-09-11 DIAGNOSIS — I8312 Varicose veins of left lower extremity with inflammation: Secondary | ICD-10-CM | POA: Diagnosis not present

## 2018-09-11 DIAGNOSIS — Z1231 Encounter for screening mammogram for malignant neoplasm of breast: Secondary | ICD-10-CM | POA: Diagnosis not present

## 2018-09-11 DIAGNOSIS — I8002 Phlebitis and thrombophlebitis of superficial vessels of left lower extremity: Secondary | ICD-10-CM | POA: Diagnosis not present

## 2018-09-12 DIAGNOSIS — F331 Major depressive disorder, recurrent, moderate: Secondary | ICD-10-CM | POA: Diagnosis not present

## 2018-09-20 DIAGNOSIS — F331 Major depressive disorder, recurrent, moderate: Secondary | ICD-10-CM | POA: Diagnosis not present

## 2018-10-02 DIAGNOSIS — F900 Attention-deficit hyperactivity disorder, predominantly inattentive type: Secondary | ICD-10-CM | POA: Diagnosis not present

## 2018-10-02 DIAGNOSIS — F3342 Major depressive disorder, recurrent, in full remission: Secondary | ICD-10-CM | POA: Diagnosis not present

## 2018-10-04 ENCOUNTER — Other Ambulatory Visit: Payer: Self-pay | Admitting: Family Medicine

## 2018-10-04 DIAGNOSIS — F331 Major depressive disorder, recurrent, moderate: Secondary | ICD-10-CM | POA: Diagnosis not present

## 2018-10-09 DIAGNOSIS — D2272 Melanocytic nevi of left lower limb, including hip: Secondary | ICD-10-CM | POA: Diagnosis not present

## 2018-10-09 DIAGNOSIS — L821 Other seborrheic keratosis: Secondary | ICD-10-CM | POA: Diagnosis not present

## 2018-10-09 DIAGNOSIS — D225 Melanocytic nevi of trunk: Secondary | ICD-10-CM | POA: Diagnosis not present

## 2018-10-09 DIAGNOSIS — D1801 Hemangioma of skin and subcutaneous tissue: Secondary | ICD-10-CM | POA: Diagnosis not present

## 2018-10-20 ENCOUNTER — Ambulatory Visit: Payer: BLUE CROSS/BLUE SHIELD | Admitting: Family Medicine

## 2018-10-20 ENCOUNTER — Encounter: Payer: Self-pay | Admitting: Family Medicine

## 2018-10-20 ENCOUNTER — Other Ambulatory Visit: Payer: Self-pay

## 2018-10-20 VITALS — BP 110/72 | HR 82 | Temp 98.2°F | Resp 15 | Ht 66.0 in | Wt 211.8 lb

## 2018-10-20 DIAGNOSIS — E669 Obesity, unspecified: Secondary | ICD-10-CM | POA: Insufficient documentation

## 2018-10-20 DIAGNOSIS — R21 Rash and other nonspecific skin eruption: Secondary | ICD-10-CM

## 2018-10-20 DIAGNOSIS — E785 Hyperlipidemia, unspecified: Secondary | ICD-10-CM

## 2018-10-20 DIAGNOSIS — E038 Other specified hypothyroidism: Secondary | ICD-10-CM

## 2018-10-20 MED ORDER — TRIAMCINOLONE ACETONIDE 0.1 % EX OINT: 1.0000 "application " | TOPICAL_OINTMENT | Freq: Two times a day (BID) | CUTANEOUS | 1 refills | Status: DC

## 2018-10-20 NOTE — Assessment & Plan Note (Signed)
Chronic problem.  Attempting to control w/ diet and exercise but she is not exercising.  Check labs and determine if meds are needed.  Will follow.

## 2018-10-20 NOTE — Assessment & Plan Note (Signed)
Ongoing issue for pt.  She is now doing [employer] and taking Phentermine per psych.  Check labs to risk stratify.  Will follow.

## 2018-10-20 NOTE — Patient Instructions (Addendum)
Schedule your complete physical in 6 months We'll notify you of your lab results and make any changes if needed Continue to work on healthy diet and regular exercise- you can do it! START a once daily Claritin or Zyrtec to stop itching APPLY the Triamcinolone ointment twice daily to the itchy areas Call with any questions or concerns Have a great weekend!

## 2018-10-20 NOTE — Progress Notes (Signed)
   Subjective:    Patient ID: Gwendolyn Shields, female    DOB: November 08, 1965, 53 y.o.   MRN: [id number]  HPI Hypothyroid- chronic problem, on Levothyroxine 117mcg and Cytomel 52mcg daily.  Due for repeat labs.  Hyperlipidemia- chronic problem, not currently on medication.  No CP, SOB, abd pain, N/V.  Obesity- pt is down 5 lbs since last visit.  BMI is 34.19.  Not exercising but joined a [employer].  Pt started [employer] and psychiatry is prescribing phentermine.  Itching of neck/chest- sxs started ~2 weeks ago.  Then had facial and had 'terrible reaction'.  Has been taking benadryl, using cortisone 2.5% and benadryl creams w/o relief.  No new detergents, lotions, or creams.  Not currently on an antihistamine.   Review of Systems For ROS see HPI     Objective:   Physical Exam Vitals signs reviewed.  Constitutional:      General: She is not in acute distress.    Appearance: She is well-developed. She is obese.  HENT:     Head: Normocephalic and atraumatic.  Eyes:     Conjunctiva/sclera: Conjunctivae normal.     Pupils: Pupils are equal, round, and reactive to light.  Neck:     Musculoskeletal: Normal range of motion and neck supple.     Thyroid: No thyromegaly.  Cardiovascular:     Rate and Rhythm: Normal rate and regular rhythm.     Heart sounds: Normal heart sounds. No murmur.  Pulmonary:     Effort: Pulmonary effort is normal. No respiratory distress.     Breath sounds: Normal breath sounds.  Abdominal:     General: There is no distension.     Palpations: Abdomen is soft.     Tenderness: There is no abdominal tenderness.  Lymphadenopathy:     Cervical: No cervical adenopathy.  Skin:    General: Skin is warm and dry.     Findings: Rash (maculopapular erythematous rash on anterior chest and neck) present.  Neurological:     Mental Status: She is alert and oriented to person, place, and time.  Psychiatric:        Behavior: Behavior normal.           Assessment & Plan:   Rash- new.  Appears to be a contact dermatitis.  Unclear of possible trigger.  Start Triamcinolone and daily antihistamine.  Pt expressed understanding and is in agreement w/ plan.

## 2018-10-20 NOTE — Assessment & Plan Note (Signed)
Chronic problem.  Currently asymptomatic.  Check labs.  Adjust meds prn  

## 2018-10-21 LAB — BASIC METABOLIC PANEL
BUN: 14 mg/dL (ref 7–25)
CHLORIDE: 104 mmol/L (ref 98–110)
CO2: 27 mmol/L (ref 20–32)
CREATININE: 1.04 mg/dL (ref 0.50–1.05)
Calcium: 9.7 mg/dL (ref 8.6–10.4)
GLUCOSE: 115 mg/dL — AB (ref 65–99)
Potassium: 4.9 mmol/L (ref 3.5–5.3)
Sodium: 140 mmol/L (ref 135–146)

## 2018-10-21 LAB — TSH: TSH: 0.47 m[IU]/L

## 2018-10-21 LAB — CBC WITH DIFFERENTIAL/PLATELET
ABSOLUTE MONOCYTES: 448 {cells}/uL (ref 200–950)
BASOS PCT: 0.5 %
Basophils Absolute: 42 cells/uL (ref 0–200)
Eosinophils Absolute: 149 cells/uL (ref 15–500)
Eosinophils Relative: 1.8 %
HCT: 43 % (ref 35.0–45.0)
Hemoglobin: 14.3 g/dL (ref 11.7–15.5)
Lymphs Abs: 1859 cells/uL (ref 850–3900)
MCH: 28.9 pg (ref 27.0–33.0)
MCHC: 33.3 g/dL (ref 32.0–36.0)
MCV: 87 fL (ref 80.0–100.0)
MONOS PCT: 5.4 %
MPV: 10.7 fL (ref 7.5–12.5)
NEUTROS ABS: 5802 {cells}/uL (ref 1500–7800)
Neutrophils Relative %: 69.9 %
PLATELETS: 365 10*3/uL (ref 140–400)
RBC: 4.94 10*6/uL (ref 3.80–5.10)
RDW: 12.6 % (ref 11.0–15.0)
TOTAL LYMPHOCYTE: 22.4 %
WBC: 8.3 10*3/uL (ref 3.8–10.8)

## 2018-10-21 LAB — HEPATIC FUNCTION PANEL
AG Ratio: 1.7 (calc) (ref 1.0–2.5)
ALKALINE PHOSPHATASE (APISO): 91 U/L (ref 37–153)
ALT: 16 U/L (ref 6–29)
AST: 18 U/L (ref 10–35)
Albumin: 4.2 g/dL (ref 3.6–5.1)
BILIRUBIN INDIRECT: 0.2 mg/dL (ref 0.2–1.2)
Bilirubin, Direct: 0.1 mg/dL (ref 0.0–0.2)
GLOBULIN: 2.5 g/dL (ref 1.9–3.7)
TOTAL PROTEIN: 6.7 g/dL (ref 6.1–8.1)
Total Bilirubin: 0.3 mg/dL (ref 0.2–1.2)

## 2018-10-21 LAB — T3, FREE: T3, Free: 2.9 pg/mL (ref 2.3–4.2)

## 2018-10-21 LAB — LIPID PANEL
CHOL/HDL RATIO: 4.2 (calc) (ref <5.0)
Cholesterol: 212 mg/dL — ABNORMAL HIGH (ref <200)
HDL: 50 mg/dL (ref >=50)
LDL Cholesterol (Calc): 137 mg/dL (calc) — ABNORMAL HIGH
Non-HDL Cholesterol (Calc): 162 mg/dL (calc) — ABNORMAL HIGH (ref <130)
Triglycerides: 124 mg/dL (ref <150)

## 2018-10-21 LAB — T4, FREE: Free T4: 1.3 ng/dL (ref 0.8–1.8)

## 2018-10-24 ENCOUNTER — Encounter: Payer: Self-pay | Admitting: General Practice

## 2018-11-10 ENCOUNTER — Encounter: Payer: BLUE CROSS/BLUE SHIELD | Admitting: Family Medicine

## 2018-11-22 ENCOUNTER — Other Ambulatory Visit: Payer: Self-pay | Admitting: Family Medicine

## 2019-02-13 ENCOUNTER — Other Ambulatory Visit: Payer: Self-pay | Admitting: Family Medicine

## 2019-02-21 ENCOUNTER — Encounter: Payer: Self-pay | Admitting: Physician Assistant

## 2019-02-21 ENCOUNTER — Other Ambulatory Visit: Payer: Self-pay | Admitting: Physician Assistant

## 2019-02-21 ENCOUNTER — Ambulatory Visit: Payer: Self-pay

## 2019-02-21 ENCOUNTER — Telehealth: Payer: Self-pay | Admitting: Family Medicine

## 2019-02-21 NOTE — Telephone Encounter (Signed)
Advised patient of recommendations. She is agreeable and would give advise

## 2019-02-21 NOTE — Telephone Encounter (Signed)
Per Elyn Aquas, PA, patient is to quarantine at home for 14 days. Patient's daughter tested positive and likely has COVID-19. Does not recommend testing at this time. Patient is aware to call office if symptoms worsen or seek medical treatment immediately.

## 2019-02-21 NOTE — Telephone Encounter (Signed)
Please advise pt    Copied from [place] [phone]. 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 Please advise.

## 2019-02-21 NOTE — Telephone Encounter (Signed)
Please advise 

## 2019-02-21 NOTE — Telephone Encounter (Signed)
I would say employees should be aware of the issue so that they can follow-up with their providers for potential testing due to the potential of exposure. If she stopped going to work at time of daughter's office visit as directed and her staff and she always wear PPE at work (as should be required) then I do not feel they would all have to quarantine at present time.

## 2019-02-21 NOTE — Telephone Encounter (Signed)
  Patient called and says her daughter just found out today that she has the coronavirus and she wants to be tested. She says yesterday she was having symptoms; headache, muscle pain in her neck, low grade fever 99.1, cough that was worse on into the night but not while sleeping, chest pain like the beginning of bronchitis. She denies SOB, no wheezing, no symptoms today, temperature normal today, no cough today. I called the office and spoke to [place], [facility] who says to let the patient know someone will call her back after speaking to Dr. Birdie Riddle. I advised the patient, she verbalized understanding.    Answer Assessment - Initial Assessment Questions 1. COVID-19 DIAGNOSIS: "Who made your Coronavirus (COVID-19) diagnosis?" "Was it confirmed by a positive lab test?" If not diagnosed by a HCP, ask "Are there lots of cases (community spread) where you live?" (See public health department website, if unsure)     Daughter tested positive, found out this morning 2. ONSET: "When did the COVID-19 symptoms start?"      Yesterday 3. WORST SYMPTOM: "What is your worst symptom?" (e.g., cough, fever, shortness of breath, muscle aches)     Coughing is the worse 4. COUGH: "Do you have a cough?" If so, ask: "How bad is the cough?"       Fine today, but yesterday it was bad toward the evening into the night 5. FEVER: "Do you have a fever?" If so, ask: "What is your temperature, how was it measured, and when did it start?"     99.1 yesterday, no fever today 6. RESPIRATORY STATUS: "Describe your breathing?" (e.g., shortness of breath, wheezing, unable to speak)      Chest hurts a little yesterday like starting of bronchitis, no SOB, no wheezing 7. BETTER-SAME-WORSE: "Are you getting better, staying the same or getting worse compared to yesterday?"  If getting worse, ask, "In what way?"     Better in comparison to yesterday 8. HIGH RISK DISEASE: "Do you have any chronic medical problems?" (e.g., asthma, heart or lung  disease, weak immune system, etc.)     No 9. PREGNANCY: "Is there any chance you are pregnant?" "When was your last menstrual period?"    No 10. OTHER SYMPTOMS: "Do you have any other symptoms?"  (e.g., chills, fatigue, headache, loss of smell or taste, muscle pain, sore throat)     Headache yesterday, neck hurt yesterday with muscle pain  Protocols used: CORONAVIRUS (COVID-19) DIAGNOSED OR SUSPECTED-A-AH

## 2019-03-01 ENCOUNTER — Telehealth: Payer: Self-pay | Admitting: *Deleted

## 2019-03-01 NOTE — Telephone Encounter (Signed)
I do not recall telling her I would call her in anything as I only gave her instructions for quarantine (she was in the room when we did daughter's visit). I told her if she developed symptoms to let us know so we can assess. Can schedule her for tomorrow via video visit so we can see if there is any indication to start antibiotics.

## 2019-03-01 NOTE — Telephone Encounter (Signed)
Pt. States that her daughter saw Einar Pheasant and has tested positive for COVID. She states that she was told to stay home for 14 days as well. She has developed a "sinus headache" and congestion. She states that [place] told her to call if she started to develop any symptoms and something could be called in for her.  She is aware that Einar Pheasant is out of the office today, but since he is the one that has addressed this with them, I am routing the message to him.

## 2019-03-01 NOTE — Telephone Encounter (Signed)
Patient has been scheduled for virtual appointment tomorrow afternoon.

## 2019-03-02 ENCOUNTER — Encounter: Payer: Self-pay | Admitting: Physician Assistant

## 2019-03-02 ENCOUNTER — Other Ambulatory Visit: Payer: Self-pay

## 2019-03-02 ENCOUNTER — Ambulatory Visit (INDEPENDENT_AMBULATORY_CARE_PROVIDER_SITE_OTHER): Payer: BC Managed Care – PPO | Admitting: Physician Assistant

## 2019-03-02 DIAGNOSIS — J019 Acute sinusitis, unspecified: Secondary | ICD-10-CM

## 2019-03-02 DIAGNOSIS — B9689 Other specified bacterial agents as the cause of diseases classified elsewhere: Secondary | ICD-10-CM

## 2019-03-02 MED ORDER — AMOXICILLIN-POT CLAVULANATE 875-125 MG PO TABS: 1.0000 | ORAL_TABLET | Freq: Two times a day (BID) | ORAL | 0 refills | Status: DC

## 2019-03-02 NOTE — Progress Notes (Signed)
Virtual Visit via Video   I connected with patient on 03/02/19 at  1:30 PM EDT by a video enabled telemedicine application and verified that I am speaking with the correct person using two identifiers.  Location patient: Home Location provider: [facility], Office Persons participating in the virtual visit: Patient, Provider, [place] (Patina Moore)  I discussed the limitations of evaluation and management by telemedicine and the availability of in person appointments. The patient expressed understanding and agreed to proceed.  Subjective:   HPI:   Patient endorses mild cough, headache and fatigue last week with some chest congestion that has seemed to improve. Has noted though for the past several days noting increased pressure, congestions of sinuses with headache, sinus pain and thick mucopurulent discharge from the nose when blowing. Denies fever, chills, recent travel. Daughter diagnosed with COVID 2.5 weeks ago. Patient had some mild symptoms at the same time so was felt to be presumptively + but notes those symptoms resolved pretty quickly. Denies SOB, Chest pain or tightness, fever or chills, diarrhea, nausea or vomiting.  ROS:   See pertinent positives and negatives per HPI.  Patient Active Problem List   Diagnosis Date Noted  . Obesity (BMI 30-39.9) 10/20/2018  . Varicose veins of left lower extremity 02/27/2018  . Family history of breast cancer   . Obstructive sleep apnea 06/25/2016  . Insomnia 06/25/2016  . Hyperlipidemia 03/21/2015  . Allergic rhinitis 10/08/2014  . Medication side effect 10/08/2014  . Hypothyroidism 04/19/2014  . Weight gain 01/17/2014  . Depression 01/17/2014  . Osteoarthritis of [place] joint of thumb 11/28/2013  . Routine general medical examination at a health care facility 07/20/2013  . History of cervical cancer 05/21/2010  . ASTHMA, UNSPECIFIED 05/21/2010  . Migraine 05/21/2010    Social History   Tobacco Use  . Smoking status:  Never Smoker  . Smokeless tobacco: Never Used  Substance Use Topics  . Alcohol use: Yes    Alcohol/week: 0.0 standard drinks    Comment: occ glass of wine (2-3 glasses of wine per month)    Current Outpatient Medications:  .  amphetamine-dextroamphetamine (ADDERALL) 20 MG tablet, TK 1 T PO TID, Disp: , Rfl: 0 .  buPROPion (WELLBUTRIN XL) 300 MG 24 hr tablet, Take 1 tablet (300 mg total) by mouth daily., Disp: 90 tablet, Rfl: 1 .  butalbital-aspirin-caffeine (FIORINAL) 50-325-40 MG capsule, TAKE ONE CAPSULE BY MOUTH THREE TIMES DAILY AS NEEDED FOR HEADACHE, Disp: 45 capsule, Rfl: 0 .  eletriptan (RELPAX) 40 MG tablet, TAKE 1 TABLET BY MOUTH AS NEEDED FOR HEADACHE/ MIGRAINES(MAY REPEAT IN 2 HOURS IF HEADACHE PERSISTS OR RECURS), Disp: 10 tablet, Rfl: 0 .  gabapentin (NEURONTIN) 100 MG capsule, Take one capsule(100 mg) by mouth daily for one week.Then take one capsule twice daily for one week.Then take one capsule three times daily. (Patient not taking: Reported on 10/20/2018), Disp: 90 capsule, Rfl: 2 .  Galcanezumab-gnlm (EMGALITY [place]), Inject into the skin every 30 (thirty) days., Disp: , Rfl:  .  levothyroxine (SYNTHROID, LEVOTHROID) 112 MCG tablet, TAKE 1 TABLET BY MOUTH EVERY DAY, Disp: 30 tablet, Rfl: 6 .  liothyronine (CYTOMEL) 5 MCG tablet, Take 10 mcg by mouth daily., Disp: , Rfl:  .  Multiple Vitamin (MULTIVITAMIN) tablet, Take 1 tablet by mouth daily., Disp: , Rfl:  .  omeprazole (PRILOSEC) 40 MG capsule, TAKE 1 CAPSULE(40 MG) BY MOUTH DAILY, Disp: 30 capsule, Rfl: 3 .  ondansetron (ZOFRAN ODT) 4 MG disintegrating tablet, Take 1 tablet (4  mg total) by mouth every 8 (eight) hours as needed for nausea or vomiting., Disp: 20 tablet, Rfl: 0 .  triamcinolone ointment (KENALOG) 0.1 %, Apply 1 application topically 2 (two) times daily., Disp: 90 g, Rfl: 1  Allergies  Allergen Reactions  . Other Rash    TAPE  . Lipitor [Atorvastatin] Nausea Only and Other (See Comments)    HA  . Adhesive  [Tape] Rash    Objective:   LMP 08/13/2016 (Approximate)   Patient is well-developed, well-nourished in no acute distress.  Resting comfortably at home.  Head is normocephalic, atraumatic.  No labored breathing.  Speech is clear and coherent with logical contest.  Patient is alert and oriented at baseline.   Assessment and Plan:   1. Acute bacterial sinusitis Rx Augmentin.  Increase fluids.  Rest.  Saline nasal spray.  Probiotic.  Mucinex as directed.  Humidifier in bedroom.  Call or return to clinic if symptoms are not improving.  - amoxicillin-clavulanate (AUGMENTIN) 875-125 MG tablet; Take 1 tablet by mouth 2 (two) times daily.  Dispense: 14 tablet; Refill: 0    Leeanne Rio, [place] 03/02/2019

## 2019-03-02 NOTE — Patient Instructions (Signed)
Instructions sent to MyChart.  Please take antibiotic as directed.  Increase fluid intake.  Use Saline nasal spray.  Take a daily multivitamin.  Place a humidifier in the bedroom.  Please call or return clinic if symptoms are not improving.  You may return to work once symptoms are resolving -- no productive cough, shortness of breath or sinus/facial pain and if you have been fever free for 72 hours without fever-reducing medications.  Sinusitis Sinusitis is redness, soreness, and swelling (inflammation) of the paranasal sinuses. Paranasal sinuses are air pockets within the bones of your face (beneath the eyes, the middle of the forehead, or above the eyes). In healthy paranasal sinuses, mucus is able to drain out, and air is able to circulate through them by way of your nose. However, when your paranasal sinuses are inflamed, mucus and air can become trapped. This can allow bacteria and other germs to grow and cause infection. Sinusitis can develop quickly and last only a short time (acute) or continue over a long period (chronic). Sinusitis that lasts for more than 12 weeks is considered chronic.  CAUSES  Causes of sinusitis include:  Allergies.  Structural abnormalities, such as displacement of the cartilage that separates your nostrils (deviated septum), which can decrease the air flow through your nose and sinuses and affect sinus drainage.  Functional abnormalities, such as when the small hairs (cilia) that line your sinuses and help remove mucus do not work properly or are not present. SYMPTOMS  Symptoms of acute and chronic sinusitis are the same. The primary symptoms are pain and pressure around the affected sinuses. Other symptoms include:  Upper toothache.  Earache.  Headache.  Bad breath.  Decreased sense of smell and taste.  A cough, which worsens when you are lying flat.  Fatigue.  Fever.  Thick drainage from your nose, which often is green and may contain pus  (purulent).  Swelling and warmth over the affected sinuses. DIAGNOSIS  Your caregiver will perform a physical exam. During the exam, your caregiver may:  Look in your nose for signs of abnormal growths in your nostrils (nasal polyps).  Tap over the affected sinus to check for signs of infection.  View the inside of your sinuses (endoscopy) with a special imaging device with a light attached (endoscope), which is inserted into your sinuses. If your caregiver suspects that you have chronic sinusitis, one or more of the following tests may be recommended:  Allergy tests.  Nasal culture A sample of mucus is taken from your nose and sent to a lab and screened for bacteria.  Nasal cytology A sample of mucus is taken from your nose and examined by your caregiver to determine if your sinusitis is related to an allergy. TREATMENT  Most cases of acute sinusitis are related to a viral infection and will resolve on their own within 10 days. Sometimes medicines are prescribed to help relieve symptoms (pain medicine, decongestants, nasal steroid sprays, or saline sprays).  However, for sinusitis related to a bacterial infection, your caregiver will prescribe antibiotic medicines. These are medicines that will help kill the bacteria causing the infection.  Rarely, sinusitis is caused by a fungal infection. In theses cases, your caregiver will prescribe antifungal medicine. For some cases of chronic sinusitis, surgery is needed. Generally, these are cases in which sinusitis recurs more than 3 times per year, despite other treatments. HOME CARE INSTRUCTIONS   Drink plenty of water. Water helps thin the mucus so your sinuses can drain more easily.  Use a humidifier.  Inhale steam 3 to 4 times a day (for example, sit in the bathroom with the shower running).  Apply a warm, moist washcloth to your face 3 to 4 times a day, or as directed by your caregiver.  Use saline nasal sprays to help moisten and  clean your sinuses.  Take over-the-counter or prescription medicines for pain, discomfort, or fever only as directed by your caregiver. SEEK IMMEDIATE MEDICAL CARE IF:  You have increasing pain or severe headaches.  You have nausea, vomiting, or drowsiness.  You have swelling around your face.  You have vision problems.  You have a stiff neck.  You have difficulty breathing. MAKE SURE YOU:   Understand these instructions.  Will watch your condition.  Will get help right away if you are not doing well or get worse. Document Released: 08/30/2005 Document Revised: 11/22/2011 Document Reviewed: 09/14/2011 [facility] Patient Information 2014 [place], [place].

## 2019-03-02 NOTE — Progress Notes (Signed)
I have discussed the procedure for the virtual visit with the patient who has given consent to proceed with assessment and treatment.   Gwendolyn Shields, CMA     

## 2019-03-28 DIAGNOSIS — F3342 Major depressive disorder, recurrent, in full remission: Secondary | ICD-10-CM | POA: Diagnosis not present

## 2019-04-16 ENCOUNTER — Other Ambulatory Visit: Payer: Self-pay | Admitting: Family Medicine

## 2019-04-16 MED ORDER — BUPROPION HCL ER (XL) 300 MG PO TB24: 300.0000 mg | ORAL_TABLET | Freq: Every day | ORAL | 0 refills | Status: DC

## 2019-04-16 MED ORDER — LEVOTHYROXINE SODIUM 112 MCG PO TABS: 112.0000 ug | ORAL_TABLET | Freq: Every day | ORAL | 0 refills | Status: DC

## 2019-04-16 NOTE — Telephone Encounter (Signed)
levothyroxine (SYNTHROID, LEVOTHROID) 112 MCG tablet   buPROPion (WELLBUTRIN XL) 300 MG 24 hr tablet  Patients daughter was in a wreck in [place] and forgot her medicine and wanted to see she could get it refilled to [facility] [phone] [place] [address]

## 2019-04-16 NOTE — Telephone Encounter (Signed)
Medications pended ok to send

## 2019-04-16 NOTE — Addendum Note (Signed)
Addended by: Davis Gourd on: 04/16/2019 04:28 PM   Modules accepted: Orders

## 2019-05-04 ENCOUNTER — Telehealth: Payer: Self-pay | Admitting: Obstetrics and Gynecology

## 2019-05-04 NOTE — Telephone Encounter (Signed)
Left message on voicemail to call and reschedule cancelled appointment. °

## 2019-05-18 DIAGNOSIS — L820 Inflamed seborrheic keratosis: Secondary | ICD-10-CM | POA: Diagnosis not present

## 2019-05-18 DIAGNOSIS — B078 Other viral warts: Secondary | ICD-10-CM | POA: Diagnosis not present

## 2019-05-18 DIAGNOSIS — L821 Other seborrheic keratosis: Secondary | ICD-10-CM | POA: Diagnosis not present

## 2019-05-18 DIAGNOSIS — L720 Epidermal cyst: Secondary | ICD-10-CM | POA: Diagnosis not present

## 2019-05-25 ENCOUNTER — Ambulatory Visit: Payer: BLUE CROSS/BLUE SHIELD | Admitting: Obstetrics and Gynecology

## 2019-06-05 ENCOUNTER — Other Ambulatory Visit: Payer: Self-pay | Admitting: General Practice

## 2019-06-05 MED ORDER — LEVOTHYROXINE SODIUM 112 MCG PO TABS: 112.0000 ug | ORAL_TABLET | Freq: Every day | ORAL | 1 refills | Status: DC

## 2019-07-03 ENCOUNTER — Other Ambulatory Visit: Payer: Self-pay | Admitting: General Practice

## 2019-07-03 MED ORDER — OMEPRAZOLE 40 MG PO CPDR: DELAYED_RELEASE_CAPSULE | ORAL | 3 refills | Status: DC

## 2019-07-04 ENCOUNTER — Other Ambulatory Visit: Payer: Self-pay

## 2019-07-04 ENCOUNTER — Ambulatory Visit (INDEPENDENT_AMBULATORY_CARE_PROVIDER_SITE_OTHER): Payer: BC Managed Care – PPO | Admitting: Family Medicine

## 2019-07-04 ENCOUNTER — Encounter: Payer: Self-pay | Admitting: Family Medicine

## 2019-07-04 VITALS — Temp 98.2°F | Wt 210.0 lb

## 2019-07-04 DIAGNOSIS — R0989 Other specified symptoms and signs involving the circulatory and respiratory systems: Secondary | ICD-10-CM | POA: Diagnosis not present

## 2019-07-04 DIAGNOSIS — J989 Respiratory disorder, unspecified: Secondary | ICD-10-CM

## 2019-07-04 MED ORDER — PREDNISONE 10 MG PO TABS: ORAL_TABLET | ORAL | 0 refills | Status: DC

## 2019-07-04 MED ORDER — ALBUTEROL SULFATE HFA 108 (90 BASE) MCG/ACT IN AERS: 2.0000 | INHALATION_SPRAY | Freq: Four times a day (QID) | RESPIRATORY_TRACT | 1 refills | Status: DC | PRN

## 2019-07-04 NOTE — Progress Notes (Signed)
Virtual Visit via Video   I connected with patient on 07/04/19 at  4:00 PM EDT by a video enabled telemedicine application and verified that I am speaking with the correct person using two identifiers.  Location patient: Home Location provider: [facility], Office Persons participating in the virtual visit: Patient, Provider, [place] (Jess B)  I discussed the limitations of evaluation and management by telemedicine and the availability of in person appointments. The patient expressed understanding and agreed to proceed.  Subjective:   HPI:   Cough- sxs started in June.  Cough is wet.  Cough is intermittently productive.  No SOB or wheezing.  No fevers.  No particular time of the day.  Daughter had COVID in June and pt has had cough since.  Had course of Augmentin at that time but no improvement.  Pt reports she can 'cough so hard it causes me to have a headache'.  No hx of similar.  Intermittent nasal congestion and PND.  Pt reports improvement w/ Albuterol inhaler.  ROS:   See pertinent positives and negatives per HPI.  Patient Active Problem List   Diagnosis Date Noted  . Obesity (BMI 30-39.9) 10/20/2018  . Varicose veins of left lower extremity 02/27/2018  . Family history of breast cancer   . Obstructive sleep apnea 06/25/2016  . Insomnia 06/25/2016  . Hyperlipidemia 03/21/2015  . Allergic rhinitis 10/08/2014  . Medication side effect 10/08/2014  . Hypothyroidism 04/19/2014  . Weight gain 01/17/2014  . Depression 01/17/2014  . Osteoarthritis of [place] joint of thumb 11/28/2013  . Routine general medical examination at a health care facility 07/20/2013  . History of cervical cancer 05/21/2010  . ASTHMA, UNSPECIFIED 05/21/2010  . Migraine 05/21/2010    Social History   Tobacco Use  . Smoking status: Never Smoker  . Smokeless tobacco: Never Used  Substance Use Topics  . Alcohol use: Yes    Alcohol/week: 0.0 standard drinks    Comment: occ glass of wine (2-3  glasses of wine per month)    Current Outpatient Medications:  .  buPROPion (WELLBUTRIN XL) 300 MG 24 hr tablet, Take 1 tablet (300 mg total) by mouth daily., Disp: 30 tablet, Rfl: 0 .  butalbital-aspirin-caffeine (FIORINAL) 50-325-40 MG capsule, TAKE ONE CAPSULE BY MOUTH THREE TIMES DAILY AS NEEDED FOR HEADACHE, Disp: 45 capsule, Rfl: 0 .  eletriptan (RELPAX) 40 MG tablet, TAKE 1 TABLET BY MOUTH AS NEEDED FOR HEADACHE/ MIGRAINES(MAY REPEAT IN 2 HOURS IF HEADACHE PERSISTS OR RECURS), Disp: 10 tablet, Rfl: 0 .  Galcanezumab-gnlm (EMGALITY [place]), Inject into the skin every 30 (thirty) days., Disp: , Rfl:  .  levothyroxine (SYNTHROID) 112 MCG tablet, Take 1 tablet (112 mcg total) by mouth daily., Disp: 30 tablet, Rfl: 1 .  liothyronine (CYTOMEL) 5 MCG tablet, Take 10 mcg by mouth daily., Disp: , Rfl:  .  Multiple Vitamin (MULTIVITAMIN) tablet, Take 1 tablet by mouth daily., Disp: , Rfl:  .  omeprazole (PRILOSEC) 40 MG capsule, TAKE 1 CAPSULE(40 MG) BY MOUTH DAILY, Disp: 30 capsule, Rfl: 3 .  ondansetron (ZOFRAN ODT) 4 MG disintegrating tablet, Take 1 tablet (4 mg total) by mouth every 8 (eight) hours as needed for nausea or vomiting., Disp: 20 tablet, Rfl: 0 .  triamcinolone ointment (KENALOG) 0.1 %, Apply 1 application topically 2 (two) times daily., Disp: 90 g, Rfl: 1 .  VYVANSE 70 MG capsule, Take 70 mg by mouth every morning., Disp: , Rfl:   Allergies  Allergen Reactions  . Other Rash  TAPE  . Lipitor [Atorvastatin] Nausea Only and Other (See Comments)    HA  . Adhesive [Tape] Rash    Objective:   Temp 98.2 F (36.8 C) (Oral)   Wt 210 lb (95.3 kg)   LMP 08/13/2016 (Approximate)   BMI 33.89 kg/m   AAOx3, NAD NCAT, EOMI No obvious CN deficits Coloring WNL Pt is able to speak clearly, coherently without shortness of breath or increased work of breathing. No cough heard Thought process is linear.  Mood is appropriate.   Assessment and Plan:   RAD- new.  Pt reports ongoing  cough since her daughter had COVID in June.  Denies SOB, fever, chills, body aches, CP but just a linger cough.  Cough improves w/ use of Albuterol inhaler.  Suspect this is RAD from likely [place].  Will start Prednisone taper, start Albuterol.  If no improvement will need Pulm referral.  Pt expressed understanding and is in agreement w/ plan.    Annye Asa, MD 07/04/2019

## 2019-07-04 NOTE — Progress Notes (Signed)
I have discussed the procedure for the virtual visit with the patient who has given consent to proceed with assessment and treatment.   Gwendolyn Shields, CMA     

## 2019-07-09 DIAGNOSIS — H524 Presbyopia: Secondary | ICD-10-CM | POA: Diagnosis not present

## 2019-07-30 ENCOUNTER — Ambulatory Visit: Payer: BLUE CROSS/BLUE SHIELD | Admitting: Obstetrics and Gynecology

## 2019-07-30 DIAGNOSIS — G43709 Chronic migraine without aura, not intractable, without status migrainosus: Secondary | ICD-10-CM | POA: Diagnosis not present

## 2019-07-31 ENCOUNTER — Other Ambulatory Visit: Payer: Self-pay | Admitting: General Practice

## 2019-07-31 MED ORDER — LEVOTHYROXINE SODIUM 112 MCG PO TABS: 112.0000 ug | ORAL_TABLET | Freq: Every day | ORAL | 1 refills | Status: DC

## 2019-08-13 ENCOUNTER — Ambulatory Visit (INDEPENDENT_AMBULATORY_CARE_PROVIDER_SITE_OTHER): Payer: BC Managed Care – PPO | Admitting: Physician Assistant

## 2019-08-13 ENCOUNTER — Encounter: Payer: Self-pay | Admitting: Physician Assistant

## 2019-08-13 ENCOUNTER — Other Ambulatory Visit: Payer: Self-pay

## 2019-08-13 VITALS — Temp 97.4°F

## 2019-08-13 DIAGNOSIS — R05 Cough: Secondary | ICD-10-CM | POA: Diagnosis not present

## 2019-08-13 DIAGNOSIS — R053 Chronic cough: Secondary | ICD-10-CM

## 2019-08-13 NOTE — Progress Notes (Signed)
Virtual Visit via Video   I connected with patient on 08/13/19 at  2:00 PM EST by a video enabled telemedicine application and verified that I am speaking with the correct person using two identifiers.  Location patient: Home Location provider: [facility], Office Persons participating in the virtual visit: Patient, Provider, PA-Student Drucilla Schmidt), CMA (Eduard Clos)  I discussed the limitations of evaluation and management by telemedicine and the availability of in person appointments. The patient expressed understanding and agreed to proceed.  Subjective:   HPI:   Patient presents via [facility] for c/o chronic cough. Dry cough in the mornings, becomes productive after using her inhaler with yellow sputum. Denies shortness of breath but states she has some difficulty catching her breath occasionally. Denies difficulty sleeping. Today noticed some nasal congestion and mild wheezing while laying down this morning but this has not been ongoing. Has a history of GERD, on Prilosec 40 mg QD with occasional breakthrough symptoms (maybe 3-4 x in a few months).  Denies fever, chills, chest pain or tightness. Denies history of seasonal allergies.    ROS:   See pertinent positives and negatives per HPI.  Patient Active Problem List   Diagnosis Date Noted  . Obesity (BMI 30-39.9) 10/20/2018  . Varicose veins of left lower extremity 02/27/2018  . Family history of breast cancer   . Obstructive sleep apnea 06/25/2016  . Insomnia 06/25/2016  . Hyperlipidemia 03/21/2015  . Allergic rhinitis 10/08/2014  . Medication side effect 10/08/2014  . Hypothyroidism 04/19/2014  . Weight gain 01/17/2014  . Depression 01/17/2014  . Osteoarthritis of [place] joint of thumb 11/28/2013  . Routine general medical examination at a health care facility 07/20/2013  . History of cervical cancer 05/21/2010  . ASTHMA, UNSPECIFIED 05/21/2010  . Migraine 05/21/2010    Social History   Tobacco Use  .  Smoking status: Never Smoker  . Smokeless tobacco: Never Used  Substance Use Topics  . Alcohol use: Yes    Alcohol/week: 0.0 standard drinks    Comment: occ glass of wine (2-3 glasses of wine per month)    Current Outpatient Medications:  .  albuterol (VENTOLIN HFA) 108 (90 Base) MCG/ACT inhaler, Inhale 2 puffs into the lungs every 6 (six) hours as needed for wheezing or shortness of breath., Disp: 18 g, Rfl: 1 .  buPROPion (WELLBUTRIN XL) 300 MG 24 hr tablet, Take 1 tablet (300 mg total) by mouth daily., Disp: 30 tablet, Rfl: 0 .  butalbital-aspirin-caffeine (FIORINAL) 50-325-40 MG capsule, TAKE ONE CAPSULE BY MOUTH THREE TIMES DAILY AS NEEDED FOR HEADACHE, Disp: 45 capsule, Rfl: 0 .  eletriptan (RELPAX) 40 MG tablet, TAKE 1 TABLET BY MOUTH AS NEEDED FOR HEADACHE/ MIGRAINES(MAY REPEAT IN 2 HOURS IF HEADACHE PERSISTS OR RECURS), Disp: 10 tablet, Rfl: 0 .  Galcanezumab-gnlm (EMGALITY [place]), Inject into the skin every 30 (thirty) days., Disp: , Rfl:  .  levothyroxine (SYNTHROID) 112 MCG tablet, Take 1 tablet (112 mcg total) by mouth daily., Disp: 30 tablet, Rfl: 1 .  liothyronine (CYTOMEL) 5 MCG tablet, Take 10 mcg by mouth daily., Disp: , Rfl:  .  Multiple Vitamin (MULTIVITAMIN) tablet, Take 1 tablet by mouth daily., Disp: , Rfl:  .  omeprazole (PRILOSEC) 40 MG capsule, TAKE 1 CAPSULE(40 MG) BY MOUTH DAILY, Disp: 30 capsule, Rfl: 3 .  ondansetron (ZOFRAN ODT) 4 MG disintegrating tablet, Take 1 tablet (4 mg total) by mouth every 8 (eight) hours as needed for nausea or vomiting., Disp: 20 tablet, Rfl: 0 .  predniSONE (DELTASONE) 10 MG tablet, 3 tabs x3 days and then 2 tabs x3 days and then 1 tab x3 days.  Take w/ food., Disp: 18 tablet, Rfl: 0 .  triamcinolone ointment (KENALOG) 0.1 %, Apply 1 application topically 2 (two) times daily., Disp: 90 g, Rfl: 1 .  VYVANSE 70 MG capsule, Take 70 mg by mouth every morning., Disp: , Rfl:   Allergies  Allergen Reactions  . Other Rash    TAPE  . Lipitor  [Atorvastatin] Nausea Only and Other (See Comments)    HA  . Adhesive [Tape] Rash    Objective:   LMP 08/13/2016 (Approximate)   Patient is well-developed, well-nourished in no acute distress.  Resting comfortably at home.  Head is normocephalic, atraumatic.  No labored breathing.  Speech is clear and coherent with logical content.  Patient is alert and oriented at baseline.   Assessment and Plan:   1. Chronic cough Ongoing. Unclear etiology. At this point would proceed with CXR to further assess. Will start antibiotic if indicated. If negative, will refer to Pulmonology for PFTs and further assessment.     Leeanne Rio, PA-C 08/13/2019

## 2019-08-13 NOTE — Patient Instructions (Addendum)
Instructions sent to MyChart.   Please go to the [facility] office for x-ray as scheduled tomorrow. We will call you with your results and alter treatment accordingly.   [address] [place]  [place], [place] [address]  Keep hydrated. Get plenty of rest. Mucinex-DM to help with congestion and cough.  We will treat based on imaging findings.

## 2019-08-13 NOTE — Progress Notes (Signed)
I have discussed the procedure for the virtual visit with the patient who has given consent to proceed with assessment and treatment.   Joanathan Affeldt S Annslee Tercero, CMA     

## 2019-08-14 ENCOUNTER — Other Ambulatory Visit: Payer: BC Managed Care – PPO

## 2019-08-20 DIAGNOSIS — M79645 Pain in left finger(s): Secondary | ICD-10-CM | POA: Diagnosis not present

## 2019-08-20 DIAGNOSIS — M79644 Pain in right finger(s): Secondary | ICD-10-CM | POA: Diagnosis not present

## 2019-08-20 DIAGNOSIS — M180 Bilateral primary osteoarthritis of first carpometacarpal joints: Secondary | ICD-10-CM | POA: Diagnosis not present

## 2019-08-21 DIAGNOSIS — G43709 Chronic migraine without aura, not intractable, without status migrainosus: Secondary | ICD-10-CM | POA: Diagnosis not present

## 2019-08-28 ENCOUNTER — Ambulatory Visit: Payer: BC Managed Care – PPO | Admitting: Obstetrics and Gynecology

## 2019-08-28 ENCOUNTER — Other Ambulatory Visit: Payer: Self-pay

## 2019-08-28 ENCOUNTER — Encounter: Payer: Self-pay | Admitting: Obstetrics and Gynecology

## 2019-08-28 VITALS — BP 126/82 | HR 80 | Temp 97.1°F | Resp 14 | Ht 66.0 in | Wt 217.0 lb

## 2019-08-28 DIAGNOSIS — Z01419 Encounter for gynecological examination (general) (routine) without abnormal findings: Secondary | ICD-10-CM | POA: Diagnosis not present

## 2019-08-28 DIAGNOSIS — R61 Generalized hyperhidrosis: Secondary | ICD-10-CM

## 2019-08-28 DIAGNOSIS — R32 Unspecified urinary incontinence: Secondary | ICD-10-CM

## 2019-08-28 MED ORDER — OXYBUTYNIN CHLORIDE ER 10 MG PO TB24: 10.0000 mg | ORAL_TABLET | Freq: Every day | ORAL | 1 refills | Status: DC

## 2019-08-28 NOTE — Progress Notes (Signed)
53 y.o. [id number] Married Caucasian female here for annual exam.    Patient complaining of dampness/sweating in groin area. Denies hot flashes.   Occasional LLQ pain.   Can leak urine at any time.  Unable to differentiate between overactive bladder and stress incontinence.  Does not feel the need to get up often at night. Feels like she voids well.   Denies hx of gluacoma.   PCP: Annye Asa, MD  Patient's last menstrual period was 08/13/2016 (approximate).           Sexually active: No.  The current method of family planning is abstinence/postmenopausal.    Exercising: Yes.    Pilates 3-4x/week Smoker:  no  Health Maintenance: Pap: 05-19-18 Neg:Neg HR HPV, 03/11/2015 Neg:Neg HR HPV, 12-14-12 Neg:Neg HR HPV History of abnormal Pap:  Yes,  history of leep 1997.  Normal pap since. MMG: 09-11-18 Neg/density B/BiRads1--patient knows she needs to schedule Colonoscopy: 07-04-18 Neg cologuard BMD:   n/a  Result  n/a TDaP:  2014 Gardasil:   no [id number] Hep C:Unsure Screening Labs:  PCP.  Flu vaccine:  Completed.    reports that she has never smoked. She has never used smokeless tobacco. She reports current alcohol use. She reports that she does not use drugs.  Past Medical History:  Diagnosis Date  . Clotting disorder ([place])    dvt in rt.leg 2016  . Depression   . DVT (deep venous thrombosis) ([place])   . Dysplasia of cervix   . Family history of breast cancer   . Hyperlipidemia 08/2014   borderline  . Migraine   . STD (sexually transmitted disease)    HSV 2  . Thyroid disease   . Urinary incontinence     Past Surgical History:  Procedure Laterality Date  . BREAST BIOPSY Left 2001   benign  . CERVICAL BIOPSY  W/ LOOP ELECTRODE EXCISION     73yrs ago  . DILATION AND CURETTAGE OF UTERUS     age 84  . INTRAUTERINE DEVICE (IUD) INSERTION  07/19/2014   Mirena  . IUD REMOVAL      Current Outpatient Medications  Medication Sig Dispense Refill  . albuterol (VENTOLIN  HFA) 108 (90 Base) MCG/ACT inhaler Inhale 2 puffs into the lungs every 6 (six) hours as needed for wheezing or shortness of breath. 18 g 1  . buPROPion (WELLBUTRIN XL) 300 MG 24 hr tablet Take 1 tablet (300 mg total) by mouth daily. 30 tablet 0  . eletriptan (RELPAX) 40 MG tablet TAKE 1 TABLET BY MOUTH AS NEEDED FOR HEADACHE/ MIGRAINES(MAY REPEAT IN 2 HOURS IF HEADACHE PERSISTS OR RECURS) 10 tablet 0  . levothyroxine (SYNTHROID) 112 MCG tablet Take 1 tablet (112 mcg total) by mouth daily. 30 tablet 1  . Multiple Vitamin (MULTIVITAMIN) tablet Take 1 tablet by mouth daily.    Mar[place] Kitchen omeprazole (PRILOSEC) 40 MG capsule TAKE 1 CAPSULE(40 MG) BY MOUTH DAILY 30 capsule 3  . ondansetron (ZOFRAN ODT) 4 MG disintegrating tablet Take 1 tablet (4 mg total) by mouth every 8 (eight) hours as needed for nausea or vomiting. 20 tablet 0   No current facility-administered medications for this visit.    Family History  Problem Relation Age of Onset  . Breast cancer Mother 39       recurence of breast cancer early 69's then developed bone cancer ate 15 /then developed bone cancer from recurence age 35  . Heart failure Father 51  . Hypertension Father   . Stroke Maternal Grandmother   .  Diabetes Maternal Grandfather   . Diabetes Paternal Grandmother   . Cervical cancer Sister 20  . Colon cancer Neg Hx   . Colon polyps Neg Hx   . Esophageal cancer Neg Hx   . Rectal cancer Neg Hx   . Stomach cancer Neg Hx     Review of Systems  Genitourinary:       Urinary incontinence  All other systems reviewed and are negative.   Exam:   BP 126/82 (Cuff Size: Large)   Pulse 80   Temp (!) 97.1 F (36.2 C) (Temporal)   Resp 14   Ht 5\' 6"  (1.676 m)   Wt 217 lb (98.4 kg)   LMP 08/13/2016 (Approximate)   BMI 35.02 kg/m     General appearance: alert, cooperative and appears stated age Head: normocephalic, without obvious abnormality, atraumatic Neck: no adenopathy, supple, symmetrical, trachea midline and thyroid  normal to inspection and palpation Lungs: clear to auscultation bilaterally Breasts: normal appearance, no masses or tenderness, No nipple retraction or dimpling, No nipple discharge or bleeding, No axillary adenopathy Heart: regular rate and rhythm Abdomen: soft, non-tender; no masses, no organomegaly Extremities: extremities normal, atraumatic, no cyanosis or edema Skin: skin color, texture, turgor normal. No rashes or lesions Lymph nodes: cervical, supraclavicular, and axillary nodes normal. Neurologic: grossly normal  Pelvic: External genitalia:  no lesions              No abnormal inguinal nodes palpated.              Urethra:  normal appearing urethra with no masses, tenderness or lesions              Bartholins and Skenes: normal                 Vagina: normal appearing vagina with normal color and discharge, no lesions              Cervix: no lesions              Pap taken: No. Bimanual Exam:  Uterus:  normal size, contour, position, consistency, mobility, non-tender              Adnexa: no mass, fullness, tenderness              Rectal exam: Yes.  .  Confirms.              Anus:  normal sphincter tone, large hemorrhoid.  Chaperone was present for exam.  Assessment:   Well woman visit with normal exam. Menopausal symptoms.  Hyperhidrosis.  Mixed incontinence.  FH of breast and possible uterine cancer.  Hx DVT.  Hx HSV 2.  Plan: Mammogram screening discussed. Self breast awareness reviewed. Pap and HR HPV as above. Start Ditropan XL 10 mg daily.  #30, RF one.  We reviewed the usefulness in treating hyperhidrosis and overactive bladder.  Fu in 6 weeks.  Cologuard every 3 years.  Routine labs with PCP.  Follow up annually and prn.    After visit summary provided.

## 2019-08-28 NOTE — Patient Instructions (Signed)
EXERCISE AND DIET:  We recommended that you start or continue a regular exercise program for good health. Regular exercise means any activity that makes your heart beat faster and makes you sweat.  We recommend exercising at least 30 minutes per day at least 3 days a week, preferably 4 or 5.  We also recommend a diet low in fat and sugar.  Inactivity, poor dietary choices and obesity can cause diabetes, heart attack, stroke, and kidney damage, among others.    ALCOHOL AND SMOKING:  Women should limit their alcohol intake to no more than 7 drinks/beers/glasses of wine (combined, not each!) per week. Moderation of alcohol intake to this level decreases your risk of breast cancer and liver damage. And of course, no recreational drugs are part of a healthy lifestyle.  And absolutely no smoking or even second hand smoke. Most people know smoking can cause heart and lung diseases, but did you know it also contributes to weakening of your bones? Aging of your skin?  Yellowing of your teeth and nails?  CALCIUM AND VITAMIN D:  Adequate intake of calcium and Vitamin D are recommended.  The recommendations for exact amounts of these supplements seem to change often, but generally speaking 600 mg of calcium (either carbonate or citrate) and 800 units of Vitamin D per day seems prudent. Certain women may benefit from higher intake of Vitamin D.  If you are among these women, your doctor will have told you during your visit.    PAP SMEARS:  Pap smears, to check for cervical cancer or precancers,  have traditionally been done yearly, although recent scientific advances have shown that most women can have pap smears less often.  However, every woman still should have a physical exam from her gynecologist every year. It will include a breast check, inspection of the vulva and vagina to check for abnormal growths or skin changes, a visual exam of the cervix, and then an exam to evaluate the size and shape of the uterus and  ovaries.  And after 54 years of age, a rectal exam is indicated to check for rectal cancers. We will also provide age appropriate advice regarding health maintenance, like when you should have certain vaccines, screening for sexually transmitted diseases, bone density testing, colonoscopy, mammograms, etc.   MAMMOGRAMS:  All women over 40 years old should have a yearly mammogram. Many facilities now offer a "3D" mammogram, which may cost around $50 extra out of pocket. If possible,  we recommend you accept the option to have the 3D mammogram performed.  It both reduces the number of women who will be called back for extra views which then turn out to be normal, and it is better than the routine mammogram at detecting truly abnormal areas.    COLONOSCOPY:  Colonoscopy to screen for colon cancer is recommended for all women at age 50.  We know, you hate the idea of the prep.  We agree, BUT, having colon cancer and not knowing it is worse!!  Colon cancer so often starts as a polyp that can be seen and removed at colonscopy, which can quite literally save your life!  And if your first colonoscopy is normal and you have no family history of colon cancer, most women don't have to have it again for 10 years.  Once every ten years, you can do something that may end up saving your life, right?  We will be happy to help you get it scheduled when you are ready.    Be sure to check your insurance coverage so you understand how much it will cost.  It may be covered as a preventative service at no cost, but you should check your particular policy.       Oxybutynin extended-release tablets What is this medicine? OXYBUTYNIN (ox i BYOO ti nin) is used to treat overactive bladder. This medicine reduces the amount of bathroom visits. It may also help to control wetting accidents. This medicine may be used for other purposes; ask your health care provider or pharmacist if you have questions. COMMON BRAND NAME(S): Ditropan  XL What should I tell my health care provider before I take this medicine? They need to know if you have any of these conditions:  autonomic neuropathy  dementia  difficulty passing urine  glaucoma  intestinal obstruction  kidney disease  liver disease  myasthenia gravis  Parkinson's disease  an unusual or allergic reaction to oxybutynin, other medicines, foods, dyes, or preservatives  pregnant or trying to get pregnant  breast-feeding How should I use this medicine? Take this medicine by mouth with a glass of water. Swallow whole, do not crush, cut, or chew. Follow the directions on the prescription label. You can take this medicine with or without food. Take your doses at regular intervals. Do not take your medicine more often than directed. Talk to your pediatrician regarding the use of this medicine in children. Special care may be needed. While this drug may be prescribed for children as young as 6 years for selected conditions, precautions do apply. Overdosage: If you think you have taken too much of this medicine contact a poison control center or emergency room at once. NOTE: This medicine is only for you. Do not share this medicine with others. What if I miss a dose? If you miss a dose, take it as soon as you can. If it is almost time for your next dose, take only that dose. Do not take double or extra doses. What may interact with this medicine?  antihistamines for allergy, cough and cold  atropine  certain medicines for bladder problems like oxybutynin, tolterodine  certain medicines for Parkinson's disease like benztropine, trihexyphenidyl  certain medicines for stomach problems like dicyclomine, hyoscyamine  certain medicines for travel sickness like scopolamine  clarithromycin  erythromycin  ipratropium  medicines for fungal infections, like fluconazole, itraconazole, ketoconazole or voriconazole This list may not describe all possible  interactions. Give your health care provider a list of all the medicines, herbs, non-prescription drugs, or dietary supplements you use. Also tell them if you smoke, drink alcohol, or use illegal drugs. Some items may interact with your medicine. What should I watch for while using this medicine? It may take a few weeks to notice the full benefit from this medicine. You may need to limit your intake tea, coffee, caffeinated sodas, and alcohol. These drinks may make your symptoms worse. You may get drowsy or dizzy. Do not drive, use machinery, or do anything that needs mental alertness until you know how this medicine affects you. Do not stand or sit up quickly, especially if you are an older patient. This reduces the risk of dizzy or fainting spells. Alcohol may interfere with the effect of this medicine. Avoid alcoholic drinks. Your mouth may get dry. Chewing sugarless gum or sucking hard candy, and drinking plenty of water may help. Contact your doctor if the problem does not go away or is severe. This medicine may cause dry eyes and blurred vision. If you wear  contact lenses, you may feel some discomfort. Lubricating drops may help. See your eyecare professional if the problem does not go away or is severe. You may notice the shells of the tablets in your stool from time to time. This is normal. Avoid extreme heat. This medicine can cause you to sweat less than normal. Your body temperature could increase to dangerous levels, which may lead to heat stroke. What side effects may I notice from receiving this medicine? Side effects that you should report to your doctor or health care professional as soon as possible:  allergic reactions like skin rash, itching or hives, swelling of the face, lips, or tongue  agitation  breathing problems  confusion  fever  flushing (reddening of the skin)  hallucinations  memory loss  pain or difficulty passing urine  palpitations  unusually weak or  tired Side effects that usually do not require medical attention (report to your doctor or health care professional if they continue or are bothersome):  constipation  headache  sexual difficulties (impotence) This list may not describe all possible side effects. Call your doctor for medical advice about side effects. You may report side effects to FDA at 1-800-FDA-1088. Where should I keep my medicine? Keep out of the reach of children. Store at room temperature between 15 and 30 degrees C (59 and 86 degrees F). Protect from moisture and humidity. Throw away any unused medicine after the expiration date. NOTE: This sheet is a summary. It may not cover all possible information. If you have questions about this medicine, talk to your doctor, pharmacist, or health care provider.  2020 Elsevier/Gold Standard (2013-11-15 10:57:06)

## 2019-09-05 DIAGNOSIS — G43709 Chronic migraine without aura, not intractable, without status migrainosus: Secondary | ICD-10-CM | POA: Diagnosis not present

## 2019-09-06 ENCOUNTER — Other Ambulatory Visit: Payer: Self-pay | Admitting: General Practice

## 2019-09-06 MED ORDER — LEVOTHYROXINE SODIUM 112 MCG PO TABS: 112.0000 ug | ORAL_TABLET | Freq: Every day | ORAL | 1 refills | Status: DC

## 2019-09-17 ENCOUNTER — Other Ambulatory Visit: Payer: Self-pay | Admitting: Obstetrics and Gynecology

## 2019-09-17 DIAGNOSIS — Z1231 Encounter for screening mammogram for malignant neoplasm of breast: Secondary | ICD-10-CM

## 2019-09-21 DIAGNOSIS — F4322 Adjustment disorder with anxiety: Secondary | ICD-10-CM | POA: Diagnosis not present

## 2019-09-21 DIAGNOSIS — F3342 Major depressive disorder, recurrent, in full remission: Secondary | ICD-10-CM | POA: Diagnosis not present

## 2019-10-04 ENCOUNTER — Telehealth: Payer: Self-pay | Admitting: Obstetrics and Gynecology

## 2019-10-04 NOTE — Telephone Encounter (Signed)
Patient cancelled Mychart appointment because she is no longer taking the medication and doing fine

## 2019-10-05 NOTE — Telephone Encounter (Signed)
Encounter reviewed and closed.  

## 2019-10-09 ENCOUNTER — Telehealth: Payer: BC Managed Care – PPO | Admitting: Obstetrics and Gynecology

## 2019-10-15 ENCOUNTER — Other Ambulatory Visit: Payer: Self-pay

## 2019-10-15 ENCOUNTER — Ambulatory Visit
Admission: RE | Admit: 2019-10-15 | Discharge: 2019-10-15 | Disposition: A | Discharge status: Home / Self Care | Payer: BC Managed Care – PPO | Source: Ambulatory Visit | Attending: Obstetrics and Gynecology | Admitting: Obstetrics and Gynecology

## 2019-10-15 DIAGNOSIS — Z1231 Encounter for screening mammogram for malignant neoplasm of breast: Secondary | ICD-10-CM | POA: Diagnosis not present

## 2019-10-22 DIAGNOSIS — N393 Stress incontinence (female) (male): Secondary | ICD-10-CM | POA: Diagnosis not present

## 2019-11-02 ENCOUNTER — Other Ambulatory Visit: Payer: Self-pay | Admitting: General Practice

## 2019-11-02 MED ORDER — OMEPRAZOLE 40 MG PO CPDR: DELAYED_RELEASE_CAPSULE | ORAL | 3 refills | Status: DC

## 2019-11-14 DIAGNOSIS — B078 Other viral warts: Secondary | ICD-10-CM | POA: Diagnosis not present

## 2019-11-14 DIAGNOSIS — D225 Melanocytic nevi of trunk: Secondary | ICD-10-CM | POA: Diagnosis not present

## 2019-11-14 DIAGNOSIS — L821 Other seborrheic keratosis: Secondary | ICD-10-CM | POA: Diagnosis not present

## 2019-11-14 DIAGNOSIS — D485 Neoplasm of uncertain behavior of skin: Secondary | ICD-10-CM | POA: Diagnosis not present

## 2019-11-14 DIAGNOSIS — L570 Actinic keratosis: Secondary | ICD-10-CM | POA: Diagnosis not present

## 2019-11-14 DIAGNOSIS — D2272 Melanocytic nevi of left lower limb, including hip: Secondary | ICD-10-CM | POA: Diagnosis not present

## 2019-12-06 ENCOUNTER — Ambulatory Visit: Payer: BC Managed Care – PPO | Attending: Internal Medicine

## 2019-12-06 DIAGNOSIS — Z23 Encounter for immunization: Secondary | ICD-10-CM

## 2019-12-06 NOTE — Progress Notes (Signed)
   Covid-19 Vaccination Clinic  Name:  Chanteal Deol    MRN: [id number] DOB: 1966/04/14  12/06/2019  Ms. Glick was observed post Covid-19 immunization for 15 minutes without incident. She was provided with Vaccine Information Sheet and instruction to access the V-Safe system.   Ms. Frakes was instructed to call 911 with any severe reactions post vaccine: Marland Kitchen Difficulty breathing  . Swelling of face and throat  . A fast heartbeat  . A bad rash all over body  . Dizziness and weakness   Immunizations Administered    Name Date Dose VIS Date Route   Pfizer COVID-19 Vaccine 12/06/2019 12:05 PM 0.3 mL 08/24/2019 Intramuscular   Manufacturer: [place]   Lot: [id number]   [place]: [id number]

## 2019-12-10 DIAGNOSIS — G43709 Chronic migraine without aura, not intractable, without status migrainosus: Secondary | ICD-10-CM | POA: Diagnosis not present

## 2019-12-27 ENCOUNTER — Telehealth: Payer: Self-pay | Admitting: Family Medicine

## 2019-12-27 NOTE — Telephone Encounter (Signed)
As long as she screens negative she can come in

## 2019-12-27 NOTE — Telephone Encounter (Signed)
LM making pt aware

## 2019-12-27 NOTE — Telephone Encounter (Signed)
Pt called in asking for an in office visit, she is not having any Covid symptoms. She is having constipation that is on going. Please advise.

## 2019-12-31 ENCOUNTER — Ambulatory Visit: Payer: BC Managed Care – PPO | Admitting: Family Medicine

## 2019-12-31 ENCOUNTER — Other Ambulatory Visit: Payer: Self-pay

## 2019-12-31 ENCOUNTER — Encounter: Payer: Self-pay | Admitting: Family Medicine

## 2019-12-31 ENCOUNTER — Ambulatory Visit: Payer: BC Managed Care – PPO | Attending: Internal Medicine

## 2019-12-31 VITALS — BP 123/82 | HR 78 | Temp 98.0°F | Resp 16 | Ht 67.0 in | Wt 204.2 lb

## 2019-12-31 DIAGNOSIS — E038 Other specified hypothyroidism: Secondary | ICD-10-CM

## 2019-12-31 DIAGNOSIS — K5900 Constipation, unspecified: Secondary | ICD-10-CM

## 2019-12-31 DIAGNOSIS — Z23 Encounter for immunization: Secondary | ICD-10-CM

## 2019-12-31 LAB — TSH: TSH: 3.14 u[IU]/mL (ref 0.35–4.50)

## 2019-12-31 MED ORDER — LINACLOTIDE 72 MCG PO CAPS: 72.0000 ug | ORAL_CAPSULE | Freq: Every day | ORAL | 3 refills | Status: DC

## 2019-12-31 NOTE — Assessment & Plan Note (Signed)
Chronic problem.  Having issues w/ severe constipation.  Will check labs and adjust meds prn.  Pt expressed understanding and is in agreement w/ plan.

## 2019-12-31 NOTE — Patient Instructions (Signed)
Follow up in 4-6 weeks to recheck constipation We'll notify you of your lab results and make any changes if needed Continue to drink plenty of water, get regular exercise ADD the Linzess once daily Call with any questions or concerns Hang in there!

## 2019-12-31 NOTE — Progress Notes (Signed)
   Covid-19 Vaccination Clinic  Name:  Alaycia Koker    MRN: [id number] DOB: May 02, 1966  12/31/2019  Ms. Emig was observed post Covid-19 immunization for 15 minutes without incident. She was provided with Vaccine Information Sheet and instruction to access the V-Safe system.   Ms. Lennen was instructed to call 911 with any severe reactions post vaccine: Marland Kitchen Difficulty breathing  . Swelling of face and throat  . A fast heartbeat  . A bad rash all over body  . Dizziness and weakness   Immunizations Administered    Name Date Dose VIS Date Route   Pfizer COVID-19 Vaccine 12/31/2019 11:32 AM 0.3 mL 11/07/2018 Intramuscular   Manufacturer: [place]   Lot: [id number]   [place]: [id number]

## 2019-12-31 NOTE — Progress Notes (Addendum)
   Subjective:    Patient ID: Gwendolyn Shields, female    DOB: July 13, 1966, 54 y.o.   MRN: [id number]  HPI Constipation- 'I never go to the bathroom'.  Pt has been taking OTC laxatives, stool softeners, fiber pills w/o relief.  Only having 1 BM weekly on average.  When she does have BM 'it almost makes me sick'- will break out in sweat, stomach hurts.  Has to strain.  Pt reports this is a change for her but 'it's been a long time'- 'probably a good year'.  Did start Emgality and then Aimovig around the time of bowel change.  Overdue for TSH check.     Review of Systems For ROS see HPI   This visit occurred during the SARS-CoV-2 public health emergency.  Safety protocols were in place, including screening questions prior to the visit, additional usage of staff PPE, and extensive cleaning of exam room while observing appropriate contact time as indicated for disinfecting solutions.       Objective:   Physical Exam Vitals reviewed.  Constitutional:      General: She is not in acute distress.    Appearance: Normal appearance. She is not ill-appearing.  HENT:     Head: Normocephalic and atraumatic.  Abdominal:     General: Bowel sounds are normal. There is no distension.     Palpations: Abdomen is soft. There is no mass.     Tenderness: There is no abdominal tenderness. There is no guarding or rebound.  Skin:    General: Skin is warm and dry.  Neurological:     General: No focal deficit present.     Mental Status: She is alert and oriented to person, place, and time.  Psychiatric:        Mood and Affect: Mood normal.        Behavior: Behavior normal.        Thought Content: Thought content normal.           Assessment & Plan:  Constipation- new to provider, ongoing for pt x1 yr.  Sxs are moderate to severe as she is only having 1 BM/week and when she has that, it causes her to feel sick.  Discussed need for increased water, regular exercise.  Since OTC treatments and laxatives  have failed will start low dose Linzess and titrate prn.  If no improvement w/ Linzess will need GI referral.  Pt expressed understanding and is in agreement w/ plan.

## 2020-01-01 ENCOUNTER — Telehealth: Payer: Self-pay | Admitting: Family Medicine

## 2020-01-01 ENCOUNTER — Encounter: Payer: Self-pay | Admitting: General Practice

## 2020-01-01 ENCOUNTER — Other Ambulatory Visit: Payer: Self-pay | Admitting: General Practice

## 2020-01-01 MED ORDER — LEVOTHYROXINE SODIUM 112 MCG PO TABS: 112.0000 ug | ORAL_TABLET | Freq: Every day | ORAL | 1 refills | Status: DC

## 2020-01-01 NOTE — Telephone Encounter (Signed)
Pt called in stating that she was told by the pharmacy that the [place] is not covered by insurance or it could require a PA, she states she is not sure. Can we reach out the the pharmacy to see what is needed. Please advise.

## 2020-01-01 NOTE — Telephone Encounter (Signed)
PA began in covermymeds today.

## 2020-01-02 MED ORDER — TRULANCE 3 MG PO TABS: 3.0000 mg | ORAL_TABLET | Freq: Every day | ORAL | 3 refills | Status: DC

## 2020-01-02 NOTE — Telephone Encounter (Signed)
Linzess was denied, Paperwork given to PCP. Per insurance pt needs to try and fail trulance.

## 2020-01-02 NOTE — Telephone Encounter (Signed)
Will try Trulance 3mg  daily #30, 3 refills in place of Linzess

## 2020-01-02 NOTE — Telephone Encounter (Signed)
PA approved. Medication filled to pharmacy.

## 2020-01-14 ENCOUNTER — Telehealth: Payer: Self-pay | Admitting: Family Medicine

## 2020-01-14 MED ORDER — LINACLOTIDE 145 MCG PO CAPS: 145.0000 ug | ORAL_CAPSULE | Freq: Every day | ORAL | 3 refills | Status: DC

## 2020-01-14 NOTE — Telephone Encounter (Signed)
Please advise 

## 2020-01-14 NOTE — Telephone Encounter (Signed)
Trulance only has 1 dose (3mg ).  We started this ~2 weeks ago.  We can try Linzess 145 since she failed Trulance but this will need a prior auth (which hopefully will be approved since she failed Trulance).  She should keep taking the Trulance until Linzess is approved

## 2020-01-14 NOTE — Telephone Encounter (Signed)
Called and informed pt. Linzess sent to pharmacy. Waiting to see if PA is initiated.

## 2020-01-14 NOTE — Telephone Encounter (Signed)
Gwendolyn Shields called stating she was prescribed Trulance for constipation. Gwendolyn Shields states it is not working for her and asked if Dr. Birdie Riddle would write her a script for a higher dosage. Please advise.

## 2020-01-16 MED ORDER — LINACLOTIDE 145 MCG PO CAPS: 145.0000 ug | ORAL_CAPSULE | Freq: Every day | ORAL | 1 refills | Status: DC

## 2020-01-16 NOTE — Telephone Encounter (Signed)
Ok to send in #90?

## 2020-01-16 NOTE — Telephone Encounter (Signed)
Ok for #90, 1 refill 

## 2020-01-16 NOTE — Telephone Encounter (Signed)
Patient would like to know if a 90 day supply of this med could be called to her pharmacy [facility]) in order for her to use a discount card?

## 2020-01-16 NOTE — Telephone Encounter (Signed)
Medication filled to pharmacy as requested.   

## 2020-01-16 NOTE — Addendum Note (Signed)
Addended by: Davis Gourd on: 01/16/2020 01:42 PM   Modules accepted: Orders

## 2020-01-17 NOTE — Telephone Encounter (Signed)
PA began in [email or web address]

## 2020-02-04 ENCOUNTER — Ambulatory Visit: Payer: BC Managed Care – PPO | Admitting: Family Medicine

## 2020-03-03 ENCOUNTER — Other Ambulatory Visit: Payer: Self-pay | Admitting: General Practice

## 2020-03-03 MED ORDER — OMEPRAZOLE 40 MG PO CPDR: DELAYED_RELEASE_CAPSULE | ORAL | 3 refills | Status: DC

## 2020-03-12 ENCOUNTER — Telehealth: Payer: Self-pay | Admitting: Family Medicine

## 2020-03-12 NOTE — Telephone Encounter (Signed)
Pt called in stating that she was seeing Dr. Joretta Bachelor in [place] for her headaches. Dr. Joretta Bachelor is no longer practicing and they have not filled her spot yet. They are using a temp. Provider at this time. Gwendolyn Shields was wanting to know if Dr. Birdie Riddle would be willing to take over her Aimovig and the Eletriptan. She states that she gets the Aimovig once monthly and she uses the Eletriptan as a rescue medication. She states if Dr. Birdie Riddle isn't willing to do this who would she recommend her to see.     Please advise

## 2020-03-12 NOTE — Telephone Encounter (Signed)
Patient notified of PCP recommendations and is agreement and expresses an understanding.  

## 2020-03-12 NOTE — Telephone Encounter (Signed)
I am ok prescribing these medications when she is due for a refill but some of the new migraine medications require a neurologist to prescribe (I hope Aimovig is not one of them!)

## 2020-03-12 NOTE — Telephone Encounter (Signed)
Please advise 

## 2020-03-27 DIAGNOSIS — M1811 Unilateral primary osteoarthritis of first carpometacarpal joint, right hand: Secondary | ICD-10-CM | POA: Diagnosis not present

## 2020-03-27 DIAGNOSIS — M79645 Pain in left finger(s): Secondary | ICD-10-CM | POA: Diagnosis not present

## 2020-03-27 DIAGNOSIS — M79644 Pain in right finger(s): Secondary | ICD-10-CM | POA: Diagnosis not present

## 2020-03-27 DIAGNOSIS — M180 Bilateral primary osteoarthritis of first carpometacarpal joints: Secondary | ICD-10-CM | POA: Diagnosis not present

## 2020-04-10 ENCOUNTER — Encounter: Payer: Self-pay | Admitting: Family Medicine

## 2020-05-20 ENCOUNTER — Telehealth: Payer: Self-pay | Admitting: Family Medicine

## 2020-05-20 MED ORDER — LEVOTHYROXINE SODIUM 112 MCG PO TABS: 112.0000 ug | ORAL_TABLET | Freq: Every day | ORAL | 1 refills | Status: DC

## 2020-05-20 NOTE — Telephone Encounter (Signed)
Patient is completely out of her levothyroxine -  Please send to [facility] in [place] - last visit 12/31/2019 - no future visits scheduled

## 2020-05-20 NOTE — Telephone Encounter (Signed)
Medication filled to pharmacy as requested.   

## 2020-05-21 ENCOUNTER — Telehealth: Payer: Self-pay | Admitting: Family Medicine

## 2020-05-21 NOTE — Telephone Encounter (Signed)
They need to call the [place] call center at [phone]. They will resend another card per Gwendolyn Shields, I have made pt aware.

## 2020-05-21 NOTE — Telephone Encounter (Signed)
Returned call to patient and advised that she could go to the [email or web address] website and it would direct her to replacing that.

## 2020-05-21 NOTE — Telephone Encounter (Signed)
Pt called in asking who would she contact to get a new covid vac. Card? She got her vaccines at the [facility].   Pt can be reached at the Cell #

## 2020-05-26 DIAGNOSIS — G43709 Chronic migraine without aura, not intractable, without status migrainosus: Secondary | ICD-10-CM | POA: Diagnosis not present

## 2020-05-26 DIAGNOSIS — G43009 Migraine without aura, not intractable, without status migrainosus: Secondary | ICD-10-CM | POA: Diagnosis not present

## 2020-05-26 DIAGNOSIS — K5900 Constipation, unspecified: Secondary | ICD-10-CM | POA: Diagnosis not present

## 2020-06-16 DIAGNOSIS — L910 Hypertrophic scar: Secondary | ICD-10-CM | POA: Diagnosis not present

## 2020-07-01 DIAGNOSIS — F3342 Major depressive disorder, recurrent, in full remission: Secondary | ICD-10-CM | POA: Diagnosis not present

## 2020-07-07 DIAGNOSIS — H5203 Hypermetropia, bilateral: Secondary | ICD-10-CM | POA: Diagnosis not present

## 2020-07-10 ENCOUNTER — Other Ambulatory Visit: Payer: Self-pay | Admitting: Family Medicine

## 2020-07-21 ENCOUNTER — Encounter: Payer: Self-pay | Admitting: Family Medicine

## 2020-07-25 ENCOUNTER — Other Ambulatory Visit: Payer: Self-pay

## 2020-07-25 ENCOUNTER — Ambulatory Visit: Payer: BC Managed Care – PPO | Admitting: Family Medicine

## 2020-07-25 ENCOUNTER — Encounter: Payer: Self-pay | Admitting: Family Medicine

## 2020-07-25 VITALS — BP 122/74 | HR 86 | Temp 97.6°F | Resp 20 | Ht 67.0 in | Wt 191.8 lb

## 2020-07-25 DIAGNOSIS — R053 Chronic cough: Secondary | ICD-10-CM | POA: Insufficient documentation

## 2020-07-25 DIAGNOSIS — Z23 Encounter for immunization: Secondary | ICD-10-CM | POA: Diagnosis not present

## 2020-07-25 MED ORDER — ALBUTEROL SULFATE HFA 108 (90 BASE) MCG/ACT IN AERS: 2.0000 | INHALATION_SPRAY | Freq: Four times a day (QID) | RESPIRATORY_TRACT | 1 refills | Status: AC | PRN

## 2020-07-25 MED ORDER — QVAR REDIHALER 80 MCG/ACT IN AERB: 1.0000 | INHALATION_SPRAY | Freq: Two times a day (BID) | RESPIRATORY_TRACT | 6 refills | Status: DC

## 2020-07-25 NOTE — Progress Notes (Signed)
° °  Subjective:    Patient ID: Gwendolyn Shields, female    DOB: [birth year], 54 y.o.   MRN: [id number]  HPI Cough- sxs started ~1 yr ago when she was suspected to have COVID (daughter was +, pt was symptomatic around the same time but did not test).  sxs will 'come and go'.  At times 'it's like a frog in my throat', other times dry cough, occasionally productive.  Denies SOB.  Notices that she will need to cough/clear her throat prior to speaking.  Pt feels cough is more upper airway and not deep in the chest.  Pt reports she seems better after using Albuterol.   Review of Systems For ROS see HPI   This visit occurred during the SARS-CoV-2 public health emergency.  Safety protocols were in place, including screening questions prior to the visit, additional usage of staff PPE, and extensive cleaning of exam room while observing appropriate contact time as indicated for disinfecting solutions.       Objective:   Physical Exam Vitals reviewed.  Constitutional:      General: She is not in acute distress.    Appearance: Normal appearance. She is not ill-appearing.  HENT:     Head: Normocephalic and atraumatic.  Eyes:     Extraocular Movements: Extraocular movements intact.     Conjunctiva/sclera: Conjunctivae normal.     Pupils: Pupils are equal, round, and reactive to light.  Cardiovascular:     Rate and Rhythm: Normal rate and regular rhythm.     Pulses: Normal pulses.     Heart sounds: Normal heart sounds.  Pulmonary:     Effort: Pulmonary effort is normal. No respiratory distress.     Breath sounds: Normal breath sounds. No wheezing, rhonchi or rales.     Comments: + hacking cough Musculoskeletal:     Cervical back: Normal range of motion and neck supple.  Lymphadenopathy:     Cervical: No cervical adenopathy.  Neurological:     General: No focal deficit present.     Mental Status: She is alert and oriented to person, place, and time.  Psychiatric:        Mood and Affect:  Mood normal.        Behavior: Behavior normal.        Thought Content: Thought content normal.           Assessment & Plan:

## 2020-07-25 NOTE — Patient Instructions (Addendum)
Follow up as needed or as scheduled Go to [place] (across from [facility]) to get your chest xray START the Qvar inhaler 1 puff twice daily- rinse mouth after use USE the Albuterol as needed for cough If no improvement in your symptoms in the next 1-2 weeks, let me know Call with any questions or concerns Hang in there! Happy Holidays!!!

## 2020-07-25 NOTE — Assessment & Plan Note (Signed)
New.  Pt reports she has had this persistent cough since she likely had COVID ~1 yr ago.  At times cough will be dry and hacking.  At times it will be productive.  Often she will just need to repeatedly clear her throat.  She has a hx of asthma (per pulmonary- although pt was not aware of this).  Discussed the possibility of cough variant asthma.  Will start daily ICS (Qvar).  She is to continue albuterol prn as she reports this improves sxs.  Will get CXR to assess for structural abnormality.  Pt's sister was recently dx'd w/ lung cancer (she was a heavy smoker) and pt is understandably concerned.  If no improvement w/ inhalers and CXR unrevealing, will refer to pulm for complete evaluation.  Pt expressed understanding and is in agreement w/ plan.

## 2020-08-11 DIAGNOSIS — M5032 Other cervical disc degeneration, mid-cervical region, unspecified level: Secondary | ICD-10-CM | POA: Diagnosis not present

## 2020-08-11 DIAGNOSIS — M9901 Segmental and somatic dysfunction of cervical region: Secondary | ICD-10-CM | POA: Diagnosis not present

## 2020-08-11 DIAGNOSIS — M9902 Segmental and somatic dysfunction of thoracic region: Secondary | ICD-10-CM | POA: Diagnosis not present

## 2020-08-11 DIAGNOSIS — R293 Abnormal posture: Secondary | ICD-10-CM | POA: Diagnosis not present

## 2020-08-22 DIAGNOSIS — R519 Headache, unspecified: Secondary | ICD-10-CM | POA: Diagnosis not present

## 2020-08-22 DIAGNOSIS — M9902 Segmental and somatic dysfunction of thoracic region: Secondary | ICD-10-CM | POA: Diagnosis not present

## 2020-08-22 DIAGNOSIS — M6240 Contracture of muscle, unspecified site: Secondary | ICD-10-CM | POA: Diagnosis not present

## 2020-08-22 DIAGNOSIS — M9901 Segmental and somatic dysfunction of cervical region: Secondary | ICD-10-CM | POA: Diagnosis not present

## 2020-08-25 DIAGNOSIS — G43009 Migraine without aura, not intractable, without status migrainosus: Secondary | ICD-10-CM | POA: Diagnosis not present

## 2020-08-26 DIAGNOSIS — M9901 Segmental and somatic dysfunction of cervical region: Secondary | ICD-10-CM | POA: Diagnosis not present

## 2020-08-26 DIAGNOSIS — R519 Headache, unspecified: Secondary | ICD-10-CM | POA: Diagnosis not present

## 2020-08-26 DIAGNOSIS — M9902 Segmental and somatic dysfunction of thoracic region: Secondary | ICD-10-CM | POA: Diagnosis not present

## 2020-08-26 DIAGNOSIS — M6240 Contracture of muscle, unspecified site: Secondary | ICD-10-CM | POA: Diagnosis not present

## 2020-08-27 DIAGNOSIS — R519 Headache, unspecified: Secondary | ICD-10-CM | POA: Diagnosis not present

## 2020-08-27 DIAGNOSIS — M9901 Segmental and somatic dysfunction of cervical region: Secondary | ICD-10-CM | POA: Diagnosis not present

## 2020-08-27 DIAGNOSIS — M6240 Contracture of muscle, unspecified site: Secondary | ICD-10-CM | POA: Diagnosis not present

## 2020-08-27 DIAGNOSIS — M9902 Segmental and somatic dysfunction of thoracic region: Secondary | ICD-10-CM | POA: Diagnosis not present

## 2020-09-08 DIAGNOSIS — M791 Myalgia, unspecified site: Secondary | ICD-10-CM | POA: Diagnosis not present

## 2020-09-08 DIAGNOSIS — R519 Headache, unspecified: Secondary | ICD-10-CM | POA: Diagnosis not present

## 2020-09-08 DIAGNOSIS — M542 Cervicalgia: Secondary | ICD-10-CM | POA: Diagnosis not present

## 2020-09-08 DIAGNOSIS — M5481 Occipital neuralgia: Secondary | ICD-10-CM | POA: Diagnosis not present

## 2020-09-10 DIAGNOSIS — M180 Bilateral primary osteoarthritis of first carpometacarpal joints: Secondary | ICD-10-CM | POA: Diagnosis not present

## 2020-09-10 DIAGNOSIS — M6240 Contracture of muscle, unspecified site: Secondary | ICD-10-CM | POA: Diagnosis not present

## 2020-09-10 DIAGNOSIS — R519 Headache, unspecified: Secondary | ICD-10-CM | POA: Diagnosis not present

## 2020-09-10 DIAGNOSIS — M9901 Segmental and somatic dysfunction of cervical region: Secondary | ICD-10-CM | POA: Diagnosis not present

## 2020-09-10 DIAGNOSIS — M9902 Segmental and somatic dysfunction of thoracic region: Secondary | ICD-10-CM | POA: Diagnosis not present

## 2020-09-15 ENCOUNTER — Ambulatory Visit: Payer: BC Managed Care – PPO | Admitting: Obstetrics and Gynecology

## 2020-09-22 DIAGNOSIS — M9902 Segmental and somatic dysfunction of thoracic region: Secondary | ICD-10-CM | POA: Diagnosis not present

## 2020-09-22 DIAGNOSIS — R519 Headache, unspecified: Secondary | ICD-10-CM | POA: Diagnosis not present

## 2020-09-22 DIAGNOSIS — M9901 Segmental and somatic dysfunction of cervical region: Secondary | ICD-10-CM | POA: Diagnosis not present

## 2020-09-22 DIAGNOSIS — M6240 Contracture of muscle, unspecified site: Secondary | ICD-10-CM | POA: Diagnosis not present

## 2020-10-01 DIAGNOSIS — R519 Headache, unspecified: Secondary | ICD-10-CM | POA: Diagnosis not present

## 2020-10-01 DIAGNOSIS — M9902 Segmental and somatic dysfunction of thoracic region: Secondary | ICD-10-CM | POA: Diagnosis not present

## 2020-10-01 DIAGNOSIS — M6240 Contracture of muscle, unspecified site: Secondary | ICD-10-CM | POA: Diagnosis not present

## 2020-10-01 DIAGNOSIS — M9901 Segmental and somatic dysfunction of cervical region: Secondary | ICD-10-CM | POA: Diagnosis not present

## 2020-11-08 ENCOUNTER — Other Ambulatory Visit: Payer: Self-pay | Admitting: Family Medicine

## 2020-11-17 DIAGNOSIS — M791 Myalgia, unspecified site: Secondary | ICD-10-CM | POA: Diagnosis not present

## 2020-11-17 DIAGNOSIS — M5481 Occipital neuralgia: Secondary | ICD-10-CM | POA: Diagnosis not present

## 2020-11-17 DIAGNOSIS — R519 Headache, unspecified: Secondary | ICD-10-CM | POA: Diagnosis not present

## 2020-11-17 DIAGNOSIS — M542 Cervicalgia: Secondary | ICD-10-CM | POA: Diagnosis not present

## 2020-11-20 ENCOUNTER — Telehealth: Payer: Self-pay | Admitting: *Deleted

## 2020-11-20 NOTE — Telephone Encounter (Signed)
Phone call to patient after hours.  No answer.  I did not leave a message.   I would recommend Xylocaine ointment 5%, apply to the area tid prn.  Disp:  35 grams, RF one.

## 2020-11-20 NOTE — Telephone Encounter (Signed)
Patient called c/o HSV 2 outbreak, rare outbreak. Patient asked if Rx can be sent in to help relieve discomfort? Please advise

## 2020-11-21 MED ORDER — LIDOCAINE 5 % EX OINT: 1.0000 "application " | TOPICAL_OINTMENT | Freq: Three times a day (TID) | CUTANEOUS | 0 refills | Status: DC | PRN

## 2020-11-21 MED ORDER — VALACYCLOVIR HCL 500 MG PO TABS: ORAL_TABLET | ORAL | 1 refills | Status: DC

## 2020-11-21 NOTE — Telephone Encounter (Signed)
Patient informed, Rx sent.  

## 2020-11-21 NOTE — Telephone Encounter (Signed)
Dr.Silva patient called back and said the Rx for lidocaine 5% is not covered. She has used generic valtrex in past.

## 2020-11-21 NOTE — Telephone Encounter (Signed)
Ok for Valtrex 500 mg po bid x 3 days.  Disp:  30 RF: one  She could also try Lidocaine jelly 2%, apply to area tid prn. Disp:  30 ml RF: none

## 2020-11-21 NOTE — Telephone Encounter (Signed)
Patient is fine with Valtrex Rx, declined lidocaine Rx.  Valtrex sent.

## 2020-12-12 ENCOUNTER — Encounter: Payer: Self-pay | Admitting: Obstetrics and Gynecology

## 2020-12-12 DIAGNOSIS — Z1231 Encounter for screening mammogram for malignant neoplasm of breast: Secondary | ICD-10-CM | POA: Diagnosis not present

## 2020-12-29 ENCOUNTER — Encounter: Payer: Self-pay | Admitting: Obstetrics and Gynecology

## 2020-12-29 ENCOUNTER — Other Ambulatory Visit: Payer: Self-pay

## 2020-12-29 ENCOUNTER — Ambulatory Visit (INDEPENDENT_AMBULATORY_CARE_PROVIDER_SITE_OTHER): Payer: BC Managed Care – PPO | Admitting: Obstetrics and Gynecology

## 2020-12-29 VITALS — BP 132/88 | HR 88 | Ht 66.0 in | Wt 197.0 lb

## 2020-12-29 DIAGNOSIS — Z01419 Encounter for gynecological examination (general) (routine) without abnormal findings: Secondary | ICD-10-CM

## 2020-12-29 DIAGNOSIS — K649 Unspecified hemorrhoids: Secondary | ICD-10-CM | POA: Diagnosis not present

## 2020-12-29 MED ORDER — TRIAMCINOLONE ACETONIDE 0.025 % EX OINT
1.0000 | TOPICAL_OINTMENT | Freq: Two times a day (BID) | CUTANEOUS | 0 refills | Status: DC
Start: 2020-12-29 — End: 2021-08-12

## 2020-12-29 MED ORDER — VALACYCLOVIR HCL 500 MG PO TABS: ORAL_TABLET | ORAL | 2 refills | Status: DC

## 2020-12-29 NOTE — Patient Instructions (Signed)

## 2020-12-29 NOTE — Progress Notes (Signed)
55 y.o. [id number] Married Caucasian female here for annual exam.    Lost weight, and her bladder control is better.   No vaginal bleeding or spotting.   Has a chronic cough since likely having Covid 2 years ago.  No testing done.  Inhaler helps.   Looking for new work.  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   PCP: Annye Asa, MD    Patient's last menstrual period was 08/13/2016 (approximate).           Sexually active: Yes.    The current method of family planning is PMP.    Exercising: No.  The patient does not participate in regular exercise at present. Smoker:  no  Health Maintenance: Pap:  05-19-18 Neg:Neg HR HPV,03/11/2015 Neg:Neg HR HPV, 12-14-12 Neg:Neg HR HPV History of abnormal Pap:  Yes, Hx of LEEP years ago. paps normal since MMG: 2 weeks ago normal with [facility] Colonoscopy: 06-18-18 Neg Cologuard BMD:   n/a  Result  n/a TDaP: 2014 Gardasil:   no HIV: Unsure Hep C:Unsure Screening Labs:  PCP   reports that she has never smoked. She has never used smokeless tobacco. She reports current alcohol use. She reports that she does not use drugs.  Past Medical History:  Diagnosis Date  . Clotting disorder ([place])    dvt in rt.leg 2016  . Depression   . DVT (deep venous thrombosis) ([place])   . Dysplasia of cervix   . Family history of breast cancer   . Hyperlipidemia 08/2014   borderline  . Migraine   . STD (sexually transmitted disease)    HSV 2  . Thyroid disease   . Urinary incontinence     Past Surgical History:  Procedure Laterality Date  . BREAST BIOPSY Left 2001   benign  . CERVICAL BIOPSY  W/ LOOP ELECTRODE EXCISION     42yrs ago  . DILATION AND CURETTAGE OF UTERUS     age 70  . INTRAUTERINE DEVICE (IUD) INSERTION  07/19/2014   Mirena  . IUD REMOVAL      Current Outpatient Medications  Medication Sig Dispense Refill  . albuterol (VENTOLIN HFA) 108 (90 Base) MCG/ACT inhaler Inhale 2 puffs into the lungs every 6 (six) hours as needed for  wheezing or shortness of breath. 18 g 1  . beclomethasone (QVAR REDIHALER) 80 MCG/ACT inhaler Inhale 1 puff into the lungs 2 (two) times daily. 1 each 6  . buPROPion (WELLBUTRIN XL) 300 MG 24 hr tablet Take 1 tablet (300 mg total) by mouth daily. 30 tablet 0  . eletriptan (RELPAX) 40 MG tablet TAKE 1 TABLET BY MOUTH AS NEEDED FOR HEADACHE/ MIGRAINES(MAY REPEAT IN 2 HOURS IF HEADACHE PERSISTS OR RECURS) 10 tablet 0  . Erenumab-aooe (AIMOVIG) 140 MG/ML SOAJ Aimovig Autoinjector 140 mg/mL subcutaneous auto-injector  ADMINISTER 1 ML UNDER THE SKIN EVERY 30 DAYS    . Fremanezumab-vfrm (AJOVY) 225 MG/1.5ML SOAJ Ajovy 225 mg/1.5 mL subcutaneous auto-injector  INJECT 225 MG UNDER THE SKIN EVERY 30 DAYS    . levothyroxine (SYNTHROID) 112 MCG tablet Take 1 tablet (112 mcg total) by mouth daily. 90 tablet 1  . linaclotide (LINZESS) 145 MCG CAPS capsule Take 1 capsule (145 mcg total) by mouth daily before breakfast. Please fill instead of #30. 90 capsule 1  . Multiple Vitamin (MULTIVITAMIN) tablet Take 1 tablet by mouth daily.    Marland Kitchen omeprazole (PRILOSEC) 40 MG capsule TAKE 1 CAPSULE(40 MG) BY MOUTH DAILY 30 capsule 3  . ondansetron (ZOFRAN-ODT)  8 MG disintegrating tablet ondansetron 8 mg disintegrating tablet    . phentermine (ADIPEX-P) 37.5 MG tablet Take 75 mg by mouth every morning.    . valACYclovir (VALTREX) 500 MG tablet Take one tablet by mouth twice daily for 3 days for outbreak then as needed. 30 tablet 1   No current facility-administered medications for this visit.    Family History  Problem Relation Age of Onset  . Breast cancer Mother 4       recurence of breast cancer early 26's then developed bone cancer ate 58 /then developed bone cancer from recurence age 70  . Heart failure Father 18  . Hypertension Father   . Stroke Maternal Grandmother   . Diabetes Maternal Grandfather   . Diabetes Paternal Grandmother   . Cervical cancer Sister 56  . Cancer Sister 29       Stage IV lung ca and  brain tumor  . Colon cancer Neg Hx   . Colon polyps Neg Hx   . Esophageal cancer Neg Hx   . Rectal cancer Neg Hx   . Stomach cancer Neg Hx     Review of Systems  All other systems reviewed and are negative.   Exam:   BP 132/88 (Cuff Size: Large)   Pulse 88   Ht 5\' 6"  (1.676 m)   Wt 197 lb (89.4 kg)   LMP 08/13/2016 (Approximate)   SpO2 99%   BMI 31.80 kg/m     General appearance: alert, cooperative and appears stated age Head: normocephalic, without obvious abnormality, atraumatic Neck: no adenopathy, supple, symmetrical, trachea midline and thyroid normal to inspection and palpation Lungs: clear to auscultation bilaterally Breasts: normal appearance, no masses or tenderness, No nipple retraction or dimpling, No nipple discharge or bleeding, No axillary adenopathy Heart: regular rate and rhythm Abdomen: soft, non-tender; no masses, no organomegaly Extremities: extremities normal, atraumatic, no cyanosis or edema Skin: skin color, texture, turgor normal. No rashes or lesions Lymph nodes: cervical, supraclavicular, and axillary nodes normal. Neurologic: grossly normal  Pelvic: External genitalia:  hypopigmentation of left labia minora, majora fold and perineum/left anal region.              No abnormal inguinal nodes palpated.              Urethra:  normal appearing urethra with no masses, tenderness or lesions              Bartholins and Skenes: normal                 Vagina: normal appearing vagina with normal color and discharge, no lesions              Cervix: no lesions              Pap taken: No. Bimanual Exam:  Uterus:  normal size, contour, position, consistency, mobility, non-tender              Adnexa: no mass, fullness, tenderness              Rectal exam: Yes.  .  Confirms.              Anus:  normal sphincter tone, prolapsing firm hemorrhoid.  No thrombosis noted.  Chaperone was present for exam.  Assessment:   Well woman visit with normal exam. LEEP 30  years ago.  FH of breast and possible uterine cancer.  Hx DVT. Hx HSV 2. Hemorrhoid. Vulvitis.  Plan: Mammogram screening discussed. Self breast  awareness reviewed. Pap and HR HPV as above. Guidelines for Calcium, Vitamin D, regular exercise program including cardiovascular and weight bearing exercise. Referral to Dr. Leighton Ruff. Triamcinolone ointment.  Rx for Valtrex.  Labs with PCP. Follow up annually and prn.

## 2021-01-12 ENCOUNTER — Other Ambulatory Visit: Payer: Self-pay

## 2021-01-12 DIAGNOSIS — E038 Other specified hypothyroidism: Secondary | ICD-10-CM

## 2021-01-12 MED ORDER — LEVOTHYROXINE SODIUM 112 MCG PO TABS: 112.0000 ug | ORAL_TABLET | Freq: Every day | ORAL | 1 refills | Status: DC

## 2021-01-13 ENCOUNTER — Other Ambulatory Visit: Payer: Self-pay

## 2021-01-13 DIAGNOSIS — R12 Heartburn: Secondary | ICD-10-CM

## 2021-01-13 MED ORDER — OMEPRAZOLE 40 MG PO CPDR: DELAYED_RELEASE_CAPSULE | ORAL | 2 refills | Status: DC

## 2021-01-30 ENCOUNTER — Other Ambulatory Visit: Payer: Self-pay

## 2021-01-30 ENCOUNTER — Encounter: Payer: Self-pay | Admitting: Family Medicine

## 2021-01-30 DIAGNOSIS — R053 Chronic cough: Secondary | ICD-10-CM

## 2021-02-06 ENCOUNTER — Ambulatory Visit (INDEPENDENT_AMBULATORY_CARE_PROVIDER_SITE_OTHER): Payer: BC Managed Care – PPO

## 2021-02-06 ENCOUNTER — Other Ambulatory Visit: Payer: Self-pay

## 2021-02-06 ENCOUNTER — Encounter: Payer: Self-pay | Admitting: Pulmonary Disease

## 2021-02-06 ENCOUNTER — Ambulatory Visit: Payer: BC Managed Care – PPO | Admitting: Pulmonary Disease

## 2021-02-06 VITALS — BP 116/78 | HR 90 | Temp 98.0°F | Ht 66.0 in | Wt 197.0 lb

## 2021-02-06 DIAGNOSIS — R059 Cough, unspecified: Secondary | ICD-10-CM

## 2021-02-06 MED ORDER — BREO ELLIPTA 100-25 MCG/INH IN AEPB: 1.0000 | INHALATION_SPRAY | Freq: Every day | RESPIRATORY_TRACT | 0 refills | Status: DC

## 2021-02-06 NOTE — Patient Instructions (Signed)
Wheezing, shortness of breath -Related to hyperactive airways  Switch from Qvar to Breo 100 Breo should be used daily  Have albuterol around to use as needed  We will obtain a chest x-ray on you Schedule you for breathing study  Follow-up in about 3 months  Call with significant concerns

## 2021-02-06 NOTE — Progress Notes (Signed)
Gwendolyn Shields    [id number]    1966-07-20  Primary Care Physician:Tabori, Aundra Millet, MD  Referring Physician: [name], MD [address] [place],  [place] [address]   Chief complaint:   Patient being seen for shortness of breath and wheezing  HPI:  Increasing wheezing over the last 6 months She believes symptoms started when she fell she had symptoms of COVID about 2 years ago, was not tested but a sibling did have COVID at the time  No history of asthma No history of diagnosed allergies, she feels she has seasonal allergies  She was started on albuterol and also Qvar  Albuterol does help with the wheezing She does not feel Qvar helps a total Never smoker No pertinent occupational history, works in a salon- hairdresser  She does have wheezing at rest and also wheezing with exertion   She has a Programmer, 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 albuterol (VENTOLIN HFA) 108 (90 Base) MCG/ACT inhaler Inhale 2 puffs into the lungs every 6 (six) hours as needed for wheezing or shortness of breath.  . beclomethasone (QVAR REDIHALER) 80 MCG/ACT inhaler Inhale 1 puff into the lungs 2 (two) times daily.  Marland Kitchen buPROPion (WELLBUTRIN XL) 300 MG 24 hr tablet Take 1 tablet (300 mg total) by mouth daily.  Marland Kitchen eletriptan (RELPAX) 40 MG tablet TAKE 1 TABLET BY MOUTH AS NEEDED FOR HEADACHE/ MIGRAINES(MAY REPEAT IN 2 HOURS IF HEADACHE PERSISTS OR RECURS)  . Erenumab-aooe (AIMOVIG) 140 MG/ML SOAJ Aimovig Autoinjector 140 mg/mL subcutaneous auto-injector  ADMINISTER 1 ML UNDER THE SKIN EVERY 30 DAYS  . Fremanezumab-vfrm (AJOVY) 225 MG/1.5ML SOAJ Ajovy 225 mg/1.5 mL subcutaneous auto-injector  INJECT 225 MG UNDER THE SKIN EVERY 30 DAYS  . levothyroxine (SYNTHROID) 112 MCG tablet Take 1 tablet (112 mcg total) by mouth daily.  Marland Kitchen linaclotide  (LINZESS) 145 MCG CAPS capsule Take 1 capsule (145 mcg total) by mouth daily before breakfast. Please fill instead of #30.  . Multiple Vitamin (MULTIVITAMIN) tablet Take 1 tablet by mouth daily.  Marland Kitchen omeprazole (PRILOSEC) 40 MG capsule TAKE 1 CAPSULE(40 MG) BY MOUTH DAILY  . ondansetron (ZOFRAN-ODT) 8 MG disintegrating tablet ondansetron 8 mg disintegrating tablet  . phentermine (ADIPEX-P) 37.5 MG tablet Take 75 mg by mouth every morning.  . triamcinolone (KENALOG) 0.025 % ointment Apply 1 application topically 2 (two) times daily. Use for 1 - 2 weeks at a time as needed.  . valACYclovir (VALTREX) 500 MG tablet Take one tablet by mouth twice daily for 3 days for outbreak then as needed.   No facility-administered encounter medications on file as of 02/06/2021.    Allergies as of 02/06/2021 - Review Complete 02/06/2021  Allergen Reaction Noted  . Other Rash 07/29/2017  . Lipitor [atorvastatin] Nausea Only and Other (See Comments) 10/08/2014  . Adhesive [tape] Rash 07/29/2017  . Latex Rash 08/28/2019    Past Medical History:  Diagnosis Date  . Clotting disorder ([facility])    dvt in rt.leg 2016  . Depression   . DVT (deep venous thrombosis) ([facility])   . Dysplasia of cervix   . Family history of breast cancer   . Hyperlipidemia 08/2014   borderline  . Migraine   . STD (sexually transmitted disease)    HSV 2  . Thyroid disease   .  Urinary incontinence     Past Surgical History:  Procedure Laterality Date  . BREAST BIOPSY Left 2001   benign  . CERVICAL BIOPSY  W/ LOOP ELECTRODE EXCISION     58yrs ago  . DILATION AND CURETTAGE OF UTERUS     age 60  . INTRAUTERINE DEVICE (IUD) INSERTION  07/19/2014   Mirena  . IUD REMOVAL      Family History  Problem Relation Age of Onset  . Breast cancer Mother 23       recurence of breast cancer early 76's then developed bone cancer ate 11 /then developed bone cancer from recurence age 91  . Heart failure Father 31  . Hypertension Father   .  Stroke Maternal Grandmother   . Diabetes Maternal Grandfather   . Diabetes Paternal Grandmother   . Cervical cancer Sister 41  . Cancer Sister 79       Stage IV lung ca and brain tumor  . Colon cancer Neg Hx   . Colon polyps Neg Hx   . Esophageal cancer Neg Hx   . Rectal cancer Neg Hx   . Stomach cancer Neg Hx     Social History   Socioeconomic History  . Marital status: Married    Spouse name: Not on file  . Number of children: Not on file  . Years of education: Not on file  . Highest education level: Not on file  Occupational History  . Not on file  Tobacco Use  . Smoking status: Never Smoker  . Smokeless tobacco: Never Used  Vaping Use  . Vaping Use: Never used  Substance and Sexual Activity  . Alcohol use: Yes    Alcohol/week: 0.0 standard drinks    Comment: rarely  . Drug use: No  . Sexual activity: Yes    Partners: Male    Birth control/protection: Post-menopausal  Other Topics Concern  . Not on file  Social History Narrative  . Not on file   Social Determinants of Health   Financial Resource Strain: Not on file  Food Insecurity: Not on file  Transportation Needs: Not on file  Physical Activity: Not on file  Stress: Not on file  Social Connections: Not on file  Intimate Partner Violence: Not on file    Review of Systems  Respiratory: Positive for shortness of breath and wheezing.   Psychiatric/Behavioral: Negative for sleep disturbance.    Vitals:   02/06/21 1329  BP: 116/78  Pulse: 90  Temp: 98 F (36.7 C)  SpO2: 99%     Physical Exam Constitutional:      Appearance: She is obese.  HENT:     Nose: No congestion.     Mouth/Throat:     Mouth: Mucous membranes are moist.  Eyes:     General: No scleral icterus.       Right eye: No discharge.        Left eye: No discharge.  Cardiovascular:     Rate and Rhythm: Normal rate and regular rhythm.     Heart sounds: No murmur heard. No friction rub.  Pulmonary:     Effort: No respiratory  distress.     Breath sounds: No stridor. No wheezing or rhonchi.  Musculoskeletal:     Cervical back: No rigidity or tenderness.  Neurological:     Mental Status: She is alert.  Psychiatric:        Mood and Affect: Mood normal.    Inhaler technique reviewed  Data Reviewed: No recent  chest x-ray  Assessment:  Shortness of breath  Wheezing  Airway hyperactivity  Plan/Recommendations: Obtain chest x-ray today  Schedule for pulmonary function test  Switch from Qvar to Breo 100-samples provided  Tentative follow-up in 3 months  Encouraged to call with any significant concerns   Sherrilyn Rist MD [facility] 02/06/2021, 1:39 PM  CC: [name], MD

## 2021-02-11 ENCOUNTER — Other Ambulatory Visit: Payer: Self-pay | Admitting: Family Medicine

## 2021-02-16 DIAGNOSIS — F3342 Major depressive disorder, recurrent, in full remission: Secondary | ICD-10-CM | POA: Diagnosis not present

## 2021-02-16 DIAGNOSIS — F5081 Binge eating disorder: Secondary | ICD-10-CM | POA: Diagnosis not present

## 2021-03-09 DIAGNOSIS — M542 Cervicalgia: Secondary | ICD-10-CM | POA: Diagnosis not present

## 2021-03-09 DIAGNOSIS — R519 Headache, unspecified: Secondary | ICD-10-CM | POA: Diagnosis not present

## 2021-03-09 DIAGNOSIS — M791 Myalgia, unspecified site: Secondary | ICD-10-CM | POA: Diagnosis not present

## 2021-03-09 DIAGNOSIS — M5481 Occipital neuralgia: Secondary | ICD-10-CM | POA: Diagnosis not present

## 2021-03-11 ENCOUNTER — Encounter: Payer: Self-pay | Admitting: *Deleted

## 2021-04-16 DIAGNOSIS — M180 Bilateral primary osteoarthritis of first carpometacarpal joints: Secondary | ICD-10-CM | POA: Diagnosis not present

## 2021-04-16 DIAGNOSIS — M1811 Unilateral primary osteoarthritis of first carpometacarpal joint, right hand: Secondary | ICD-10-CM | POA: Diagnosis not present

## 2021-04-25 ENCOUNTER — Encounter: Payer: Self-pay | Admitting: Family Medicine

## 2021-04-28 ENCOUNTER — Other Ambulatory Visit: Payer: Self-pay

## 2021-04-28 DIAGNOSIS — Z86718 Personal history of other venous thrombosis and embolism: Secondary | ICD-10-CM

## 2021-04-28 NOTE — Telephone Encounter (Signed)
Referral placed.

## 2021-05-07 ENCOUNTER — Telehealth: Payer: Self-pay | Admitting: Hematology and Oncology

## 2021-05-07 NOTE — Telephone Encounter (Signed)
Received a new hem referral from [facility] for hx of pe. Gwendolyn Shields has been cld and scheduled to see Dr. Lindi Adie on 9/6 at 2pm. Pt aware to arrive 15 minutes early.

## 2021-05-12 DIAGNOSIS — G43009 Migraine without aura, not intractable, without status migrainosus: Secondary | ICD-10-CM | POA: Diagnosis not present

## 2021-05-19 ENCOUNTER — Inpatient Hospital Stay: Payer: BC Managed Care – PPO | Admitting: Hematology and Oncology

## 2021-05-29 DIAGNOSIS — L821 Other seborrheic keratosis: Secondary | ICD-10-CM | POA: Diagnosis not present

## 2021-05-29 DIAGNOSIS — D224 Melanocytic nevi of scalp and neck: Secondary | ICD-10-CM | POA: Diagnosis not present

## 2021-05-29 DIAGNOSIS — D2272 Melanocytic nevi of left lower limb, including hip: Secondary | ICD-10-CM | POA: Diagnosis not present

## 2021-05-29 DIAGNOSIS — D225 Melanocytic nevi of trunk: Secondary | ICD-10-CM | POA: Diagnosis not present

## 2021-06-01 NOTE — Progress Notes (Signed)
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 female is here because of a history of DVT of the peroneal vein in 2015.  She was on anticoagulation for 3 years and was referred to [facility] in 2018.  We did hypercoagulability work-up and it was all negative.  Based on that, we decided to discontinue anticoagulation at that time.  She has been doing quite well ever since without any further history of blood clots.  She is in the work-up for undergoing a plastic surgery abdominoplasty in [place] and she was asked to see [place] for discussion regarding postoperative anticoagulation given her prior history of lower extremity blood clot.  I reviewed her records extensively and collaborated the history with the patient.  MEDICAL HISTORY:  Past Medical History:  Diagnosis Date   Clotting disorder ([facility])    dvt in rt.leg 2016   Depression    DVT (deep venous thrombosis) ([facility])    Dysplasia of cervix    Family history of breast cancer    Hyperlipidemia 08/2014   borderline   Migraine    STD (sexually transmitted disease)    HSV 2   Thyroid disease    Urinary incontinence     SURGICAL HISTORY: Past Surgical History:  Procedure Laterality Date   BREAST BIOPSY Left 2001   benign   CERVICAL BIOPSY  W/ LOOP ELECTRODE EXCISION     89yr ago   DILATION AND CURETTAGE OF UTERUS     age 55  INTRAUTERINE DEVICE (IUD) INSERTION  07/19/2014   Mirena   IUD REMOVAL      SOCIAL HISTORY: Social History   Socioeconomic History   Marital status: Married    Spouse name: Not on file   Number of children: Not on file   Years of education: Not on file   Highest education level: Not on file  Occupational History   Not on file  Tobacco  Use   Smoking status: Never   Smokeless tobacco: Never  Vaping Use   Vaping Use: Never used  Substance and Sexual Activity   Alcohol use: Yes    Alcohol/week: 0.0 standard drinks    Comment: rarely   Drug use: No   Sexual activity: Yes    Partners: Male    Birth control/protection: Post-menopausal  Other Topics Concern   Not on file  Social History Narrative   Not on file   Social Determinants of Health   Financial Resource Strain: Not on file  Food Insecurity: Not on file  Transportation Needs: Not on file  Physical Activity: Not on file  Stress: Not on file  Social Connections: Not on file  Intimate Partner Violence: Not on file    FAMILY HISTORY: Family History  Problem Relation Age of Onset   Breast cancer Mother 639      recurence of breast cancer early 662'sthen developed bone cancer ate 733/then developed bone cancer from recurence age 55  Heart failure Father 662  Hypertension Father    Stroke Maternal Grandmother    Diabetes Maternal Grandfather    Diabetes Paternal Grandmother    Cervical cancer Sister 564  Cancer Sister 622      Stage IV lung ca and brain  tumor   Colon cancer Neg Hx    Colon polyps Neg Hx    Esophageal cancer Neg Hx    Rectal cancer Neg Hx    Stomach cancer Neg Hx     ALLERGIES:  is allergic to other, lipitor [atorvastatin], adhesive [tape], and latex.  MEDICATIONS:  Current Outpatient Medications  Medication Sig Dispense Refill   albuterol (VENTOLIN HFA) 108 (90 Base) MCG/ACT inhaler Inhale 2 puffs into the lungs every 6 (six) hours as needed for wheezing or shortness of breath. 18 g 1   buPROPion (WELLBUTRIN XL) 300 MG 24 hr tablet Take 1 tablet (300 mg total) by mouth daily. 30 tablet 0   eletriptan (RELPAX) 40 MG tablet TAKE 1 TABLET BY MOUTH AS NEEDED FOR HEADACHE/ MIGRAINES(MAY REPEAT IN 2 HOURS IF HEADACHE PERSISTS OR RECURS) 10 tablet 0   Erenumab-aooe (AIMOVIG) 140 MG/ML SOAJ Aimovig Autoinjector 140 mg/mL subcutaneous  auto-injector  ADMINISTER 1 ML UNDER THE SKIN EVERY 30 DAYS     fluticasone furoate-vilanterol (BREO ELLIPTA) 100-25 MCG/INH AEPB Inhale 1 puff into the lungs daily. 2 each 0   Fremanezumab-vfrm (AJOVY) 225 MG/1.5ML SOAJ Ajovy 225 mg/1.5 mL subcutaneous auto-injector  INJECT 225 MG UNDER THE SKIN EVERY 30 DAYS     levothyroxine (SYNTHROID) 112 MCG tablet Take 1 tablet (112 mcg total) by mouth daily. 90 tablet 1   LINZESS 145 MCG CAPS capsule TAKE 1 CAPSULE BY MOUTH EVERY DAY BEFORE BREAKFAST 90 capsule 0   Multiple Vitamin (MULTIVITAMIN) tablet Take 1 tablet by mouth daily.     omeprazole (PRILOSEC) 40 MG capsule TAKE 1 CAPSULE(40 MG) BY MOUTH DAILY 30 capsule 2   ondansetron (ZOFRAN-ODT) 8 MG disintegrating tablet ondansetron 8 mg disintegrating tablet     phentermine (ADIPEX-P) 37.5 MG tablet Take 75 mg by mouth every morning.     triamcinolone (KENALOG) 0.025 % ointment Apply 1 application topically 2 (two) times daily. Use for 1 - 2 weeks at a time as needed. 30 g 0   valACYclovir (VALTREX) 500 MG tablet Take one tablet by mouth twice daily for 3 days for outbreak then as needed. 30 tablet 2   No current facility-administered medications for this visit.    REVIEW OF SYSTEMS:     All other systems were reviewed with the patient and are negative.  PHYSICAL EXAMINATION: ECOG PERFORMANCE STATUS: 0 - Asymptomatic  Vitals:   06/02/21 0840  BP: (!) 169/59  Pulse: 87  Resp: 18  Temp: (!) 97.4 F (36.3 C)  SpO2: 100%   Filed Weights   06/02/21 0840  Weight: 186 lb 1.6 oz (84.4 kg)       LABORATORY DATA:  I have reviewed the data as listed Lab Results  Component Value Date   WBC 8.3 10/20/2018   HGB 14.3 10/20/2018   HCT 43.0 10/20/2018   MCV 87.0 10/20/2018   PLT 365 10/20/2018   Lab Results  Component Value Date   NA 140 10/20/2018   K 4.9 10/20/2018   CL 104 10/20/2018   CO2 27 10/20/2018    RADIOGRAPHIC STUDIES: I have personally reviewed the radiological  reports and agreed with the findings in the report.  ASSESSMENT AND PLAN:  History of DVT (deep vein thrombosis) 11/28/2013: A peroneal vein branch at the level of the ankle demonstrated thrombus. A branch of the greater saphenous vein in the mid calf demonstrated thrombus  Work Up 06/06/2017: Negative for inherited or acquired hypercoagulable risk factors Discontinued anticoagulation 06/20/2017  Abdominoplasty/tummy tuck  procedure planning to be done in January 2022: Based on previously negative hypercoagulability work-up, her risk of recurrent blood clot is felt to be at an average risk and therefore I did not recommend prophylactic anticoagulation unless she needs to be immobile for a long period of time. My recommendation is early ambulation postoperatively. I also recommended elastic compression stockings postoperatively to prevent any venous stasis.  If for some reason her postoperative course is complicated where she requires to be on bedrest for any length of time then prophylactic anticoagulation with either Xarelto or Eliquis would be appropriate.  The duration of anticoagulation can be up to 6 weeks (can be as short as 3 weeks if her mobility improves sooner) I discussed with her that currently she is at an mild to moderate risk for blood clots and therefore I did not recommend preventive anticoagulation. I looked at [place] and based my recommendation on her overall health being in excellent condition and a below-knee DVT 7 years ago and lack of any additional risk factors.  All questions were answered. The patient knows to call the clinic with any problems, questions or concerns.   Rulon Eisenmenger, MD, MPH 06/02/2021    I, Thana Ates, am acting as scribe for Nicholas Lose, MD.  I have reviewed the above documentation for accuracy and completeness, and I agree with the above.

## 2021-06-02 ENCOUNTER — Other Ambulatory Visit: Payer: Self-pay

## 2021-06-02 ENCOUNTER — Inpatient Hospital Stay: Payer: BC Managed Care – PPO | Attending: Hematology and Oncology | Admitting: Hematology and Oncology

## 2021-06-02 DIAGNOSIS — Z86718 Personal history of other venous thrombosis and embolism: Secondary | ICD-10-CM | POA: Diagnosis not present

## 2021-06-02 NOTE — Assessment & Plan Note (Signed)
11/28/2013: A peroneal vein branch at the level of the ankle demonstrated thrombus. A branch of the greater saphenous vein in the mid calf demonstrated thrombus  Work Up 06/06/2017: Negative for inherited or acquired hypercoagulable risk factors Discontinued anticoagulation 06/20/2017

## 2021-08-12 ENCOUNTER — Ambulatory Visit: Payer: BC Managed Care – PPO | Admitting: Family Medicine

## 2021-08-12 ENCOUNTER — Encounter: Payer: Self-pay | Admitting: Family Medicine

## 2021-08-12 VITALS — BP 116/74 | HR 81 | Temp 98.4°F | Resp 16 | Wt 179.4 lb

## 2021-08-12 DIAGNOSIS — Z23 Encounter for immunization: Secondary | ICD-10-CM

## 2021-08-12 DIAGNOSIS — H5203 Hypermetropia, bilateral: Secondary | ICD-10-CM | POA: Diagnosis not present

## 2021-08-12 DIAGNOSIS — Z01818 Encounter for other preprocedural examination: Secondary | ICD-10-CM | POA: Diagnosis not present

## 2021-08-12 DIAGNOSIS — Z01812 Encounter for preprocedural laboratory examination: Secondary | ICD-10-CM

## 2021-08-12 DIAGNOSIS — G43709 Chronic migraine without aura, not intractable, without status migrainosus: Secondary | ICD-10-CM | POA: Diagnosis not present

## 2021-08-12 LAB — APTT: aPTT: 29.1 s (ref 23.4–32.7)

## 2021-08-12 LAB — CBC WITH DIFFERENTIAL/PLATELET
Basophils Absolute: 0.1 10*3/uL (ref 0.0–0.1)
Basophils Relative: 0.5 % (ref 0.0–3.0)
Eosinophils Absolute: 0.1 10*3/uL (ref 0.0–0.7)
Eosinophils Relative: 0.7 % (ref 0.0–5.0)
HCT: 42 % (ref 36.0–46.0)
Hemoglobin: 13.9 g/dL (ref 12.0–15.0)
Lymphocytes Relative: 12.3 % (ref 12.0–46.0)
Lymphs Abs: 1.9 10*3/uL (ref 0.7–4.0)
MCHC: 33.2 g/dL (ref 30.0–36.0)
MCV: 89.4 fl (ref 78.0–100.0)
Monocytes Absolute: 0.8 10*3/uL (ref 0.1–1.0)
Monocytes Relative: 5.1 % (ref 3.0–12.0)
Neutro Abs: 12.4 10*3/uL — ABNORMAL HIGH (ref 1.4–7.7)
Neutrophils Relative %: 81.4 % — ABNORMAL HIGH (ref 43.0–77.0)
Platelets: 374 10*3/uL (ref 150.0–400.0)
RBC: 4.69 Mil/uL (ref 3.87–5.11)
RDW: 13.2 % (ref 11.5–15.5)
WBC: 15.3 10*3/uL — ABNORMAL HIGH (ref 4.0–10.5)

## 2021-08-12 LAB — COMPREHENSIVE METABOLIC PANEL
ALT: 11 U/L (ref 0–35)
AST: 15 U/L (ref 0–37)
Albumin: 4.2 g/dL (ref 3.5–5.2)
Alkaline Phosphatase: 83 U/L (ref 39–117)
BUN: 8 mg/dL (ref 6–23)
CO2: 30 mEq/L (ref 19–32)
Calcium: 9.8 mg/dL (ref 8.4–10.5)
Chloride: 103 mEq/L (ref 96–112)
Creatinine, Ser: 0.83 mg/dL (ref 0.40–1.20)
GFR: 78.98 mL/min (ref >60.00)
Glucose, Bld: 76 mg/dL (ref 70–99)
Potassium: 4.3 mEq/L (ref 3.5–5.1)
Sodium: 139 mEq/L (ref 135–145)
Total Bilirubin: 0.5 mg/dL (ref 0.2–1.2)
Total Protein: 7.2 g/dL (ref 6.0–8.3)

## 2021-08-12 LAB — TSH: TSH: 1.08 u[IU]/mL (ref 0.35–5.50)

## 2021-08-12 NOTE — Progress Notes (Signed)
Subjective:    Patient ID: Gwendolyn Shields, female    DOB: 01/24/1966, 55 y.o.   MRN: [id number]    Subjective:    Gwendolyn Shields is a 55 y.o. female who presents to the office today for a preoperative consultation at the request of surgeon Dr Geri Seminole who plans on performing tummy tuck on January 12. This consultation is requested for the specific conditions prompting preoperative evaluation (i.e. because of potential affect on operative risk): hypothyroid. Planned anesthesia: general. The patient has the following known anesthesia issues:  no previous anesthesia . Patients bleeding risk: no recent abnormal bleeding, no remote history of abnormal bleeding, and no use of Ca-channel blockers. Patient does not have objections to receiving blood products if needed.  The following portions of the patient's history were reviewed and updated as appropriate: allergies, current medications, past family history, past medical history, past social history, past surgical history, and problem list.  Review of Systems A comprehensive review of systems was negative.    Objective:    BP 116/74   Pulse 81   Temp 98.4 F (36.9 C)   Resp 16   Wt 179 lb 6.4 oz (81.4 kg)   LMP 08/13/2016 (Approximate)   SpO2 98%   BMI 28.96 kg/m   General Appearance:    Alert, cooperative, no distress, appears stated age  Head:    Normocephalic, without obvious abnormality, atraumatic  Eyes:    PERRL, conjunctiva/corneas clear, EOM's intact, fundi    benign, both eyes  Ears:    Normal TM's and external ear canals, both ears  Nose:   Nares normal, septum midline, mucosa normal, no drainage  or sinus tenderness  Throat:   Lips, mucosa, and tongue normal; teeth and gums normal  Neck:   Supple, symmetrical, trachea midline, no adenopathy;    thyroid:  no enlargement/tenderness/nodules; no carotid   bruit or JVD  Back:     Symmetric, no curvature, ROM normal, no CVA tenderness  Lungs:     Clear to auscultation  bilaterally, respirations unlabored  Chest Wall:    No tenderness or deformity   Heart:    Regular rate and rhythm, S1 and S2 normal, no murmur, rub   or gallop  Breast Exam:    Deferred  Abdomen:     Soft, non-tender, bowel sounds active all four quadrants,    no masses, no organomegaly  Genitalia:    Deferred   Rectal:    Extremities:   Extremities normal, atraumatic, no cyanosis or edema  Pulses:   2+ and symmetric all extremities  Skin:   Skin color, texture, turgor normal, no rashes or lesions  Lymph nodes:   Cervical, supraclavicular, and axillary nodes normal  Neurologic:   CNII-XII intact, normal strength, sensation and reflexes    throughout    Predictors of intubation difficulty:  Morbid obesity? no  Anatomically abnormal facies? no  Prominent incisors? no  Receding mandible? no  Short, thick neck? no  Neck range of motion: normal  Dentition: No chipped, loose, or missing teeth.  Cardiographics ECG: normal sinus rhythm, no blocks or conduction defects, no ischemic changes Echocardiogram: not done  Imaging Chest x-ray: normal   Lab Review  not applicable    Assessment:      55 y.o. female with planned surgery as above.   Known risk factors for perioperative complications: None   Difficulty with intubation is not anticipated.  Cardiac Risk Estimation: low  Current medications which may produce withdrawal  symptoms if withheld perioperatively: none need to be held    Plan:    1. Preoperative workup as follows ECG, hemoglobin, hematocrit, electrolytes, creatinine, glucose, liver function studies, coagulation studies. 2. Change in medication regimen before surgery: none, continue medication regimen including morning of surgery, with sip of water. 3. Prophylaxis for cardiac events with perioperative beta-blockers: not indicated. 4. Invasive hemodynamic monitoring perioperatively: at the discretion of anesthesiologist. 5. Deep vein thrombosis prophylaxis  postoperatively:regimen to be chosen by surgical team. 6. Surveillance for postoperative MI with ECG immediately postoperatively and on postoperative days 1 and 2 AND troponin levels 24 hours postoperatively and on day 4 or hospital discharge (whichever comes first): at the discretion of anesthesiologist. 7. Other measures:  none

## 2021-08-12 NOTE — Patient Instructions (Addendum)
Schedule your complete physical in 6 months We'll notify you of your lab results and make any changes if needed Continue to work on healthy diet and regular exercise- you're doing great! Call with any questions or concerns Stay Safe!  Stay Healthy! Happy Holidays!!!

## 2021-08-18 ENCOUNTER — Telehealth: Payer: Self-pay

## 2021-08-18 LAB — FACTOR 5 LEIDEN: Result: NEGATIVE

## 2021-08-18 NOTE — Telephone Encounter (Signed)
-----   Message from [name], MD sent at 08/13/2021 11:58 AM EST ----- Labs look good w/ the exception of elevated white blood cells.  Have you been fighting off a recent infection?

## 2021-08-18 NOTE — Telephone Encounter (Signed)
Lvm for patient regarding lab results, let her know that I would also send a my chart message to her.

## 2021-08-19 ENCOUNTER — Other Ambulatory Visit: Payer: Self-pay

## 2021-08-19 DIAGNOSIS — E038 Other specified hypothyroidism: Secondary | ICD-10-CM

## 2021-08-19 MED ORDER — LEVOTHYROXINE SODIUM 112 MCG PO TABS: 112.0000 ug | ORAL_TABLET | Freq: Every day | ORAL | 1 refills | Status: DC

## 2021-09-02 DIAGNOSIS — Z01812 Encounter for preprocedural laboratory examination: Secondary | ICD-10-CM | POA: Diagnosis not present

## 2021-09-13 HISTORY — PX: COSMETIC SURGERY: SHX468

## 2021-09-21 ENCOUNTER — Encounter: Payer: Self-pay | Admitting: Family Medicine

## 2021-09-21 DIAGNOSIS — G43709 Chronic migraine without aura, not intractable, without status migrainosus: Secondary | ICD-10-CM | POA: Diagnosis not present

## 2021-09-21 MED ORDER — BUPROPION HCL ER (XL) 300 MG PO TB24
300.0000 mg | ORAL_TABLET | Freq: Every day | ORAL | 1 refills | Status: DC
Start: 2021-09-21 — End: 2022-04-05

## 2021-10-13 DIAGNOSIS — G43719 Chronic migraine without aura, intractable, without status migrainosus: Secondary | ICD-10-CM | POA: Diagnosis not present

## 2021-10-26 ENCOUNTER — Telehealth: Payer: Self-pay

## 2021-10-26 NOTE — Telephone Encounter (Signed)
Patient called in voice mail stating she was "feeling raw" and asking for Rx to be sent. Gwendolyn Shields she was leaving town tomorrow afternoon and hoped it could be handled in the morning. I checked with appt desk and there is an 8:30 appt available.  I called patient and let her know Dr. Quincy Simmonds would prefer to see her for visit versus treating over the phone. I offered 8:30am appt. Patient declined stating her sister has passed away and she is working on getting ready to leave town tomorrow afternoon to travel and would not be able to come in.  She went on to explain that she has had "tummu tuck" surgery with drains in, etc and for 4 weeks has been on Cephalexin.  She said she is experiencing some external itching around her pantyline but no vaginal itching, just feels raw.  I told her I would inform Dr. Quincy Simmonds of all this and see what she recommends.

## 2021-10-26 NOTE — Telephone Encounter (Signed)
I am so sorry to hear of her sister's passing.   I recommend she try some over the counter Monistat.

## 2021-10-27 NOTE — Telephone Encounter (Signed)
Spoke with patient and informed her. °

## 2021-11-13 ENCOUNTER — Encounter: Payer: Self-pay | Admitting: Family Medicine

## 2021-11-27 DIAGNOSIS — Z1211 Encounter for screening for malignant neoplasm of colon: Secondary | ICD-10-CM | POA: Diagnosis not present

## 2021-11-30 ENCOUNTER — Ambulatory Visit: Payer: BC Managed Care – PPO | Admitting: Family Medicine

## 2021-11-30 ENCOUNTER — Encounter: Payer: Self-pay | Admitting: Family Medicine

## 2021-11-30 VITALS — BP 116/80 | HR 83 | Temp 98.2°F | Resp 16 | Wt 161.2 lb

## 2021-11-30 DIAGNOSIS — L659 Nonscarring hair loss, unspecified: Secondary | ICD-10-CM

## 2021-11-30 LAB — BASIC METABOLIC PANEL
BUN: 13 mg/dL (ref 6–23)
CO2: 28 mEq/L (ref 19–32)
Calcium: 9.6 mg/dL (ref 8.4–10.5)
Chloride: 104 mEq/L (ref 96–112)
Creatinine, Ser: 1.06 mg/dL (ref 0.40–1.20)
GFR: 58.77 mL/min — ABNORMAL LOW (ref >60.00)
Glucose, Bld: 86 mg/dL (ref 70–99)
Potassium: 4.1 mEq/L (ref 3.5–5.1)
Sodium: 140 mEq/L (ref 135–145)

## 2021-11-30 LAB — CBC WITH DIFFERENTIAL/PLATELET
Basophils Absolute: 0.1 10*3/uL (ref 0.0–0.1)
Basophils Relative: 0.9 % (ref 0.0–3.0)
Eosinophils Absolute: 0.2 10*3/uL (ref 0.0–0.7)
Eosinophils Relative: 2.1 % (ref 0.0–5.0)
HCT: 44.6 % (ref 36.0–46.0)
Hemoglobin: 14.7 g/dL (ref 12.0–15.0)
Lymphocytes Relative: 23.2 % (ref 12.0–46.0)
Lymphs Abs: 1.8 10*3/uL (ref 0.7–4.0)
MCHC: 32.9 g/dL (ref 30.0–36.0)
MCV: 90.8 fl (ref 78.0–100.0)
Monocytes Absolute: 0.4 10*3/uL (ref 0.1–1.0)
Monocytes Relative: 5.2 % (ref 3.0–12.0)
Neutro Abs: 5.2 10*3/uL (ref 1.4–7.7)
Neutrophils Relative %: 68.6 % (ref 43.0–77.0)
Platelets: 352 10*3/uL (ref 150.0–400.0)
RBC: 4.91 Mil/uL (ref 3.87–5.11)
RDW: 13.4 % (ref 11.5–15.5)
WBC: 7.6 10*3/uL (ref 4.0–10.5)

## 2021-11-30 LAB — HEPATIC FUNCTION PANEL
ALT: 9 U/L (ref 0–35)
AST: 16 U/L (ref 0–37)
Albumin: 4.2 g/dL (ref 3.5–5.2)
Alkaline Phosphatase: 74 U/L (ref 39–117)
Bilirubin, Direct: 0.1 mg/dL (ref 0.0–0.3)
Total Bilirubin: 0.5 mg/dL (ref 0.2–1.2)
Total Protein: 6.5 g/dL (ref 6.0–8.3)

## 2021-11-30 LAB — TSH: TSH: 1.44 u[IU]/mL (ref 0.35–5.50)

## 2021-11-30 MED ORDER — FLUTICASONE FUROATE-VILANTEROL 100-25 MCG/ACT IN AEPB: 1.0000 | INHALATION_SPRAY | Freq: Every day | RESPIRATORY_TRACT | 0 refills | Status: DC

## 2021-11-30 NOTE — Progress Notes (Signed)
? ?  Subjective:  ? ? Patient ID: Gwendolyn Shields, female    DOB: April 02, 1966, 56 y.o.   MRN: [id number] ? ?HPI ?Hair loss- sxs started 3-4 weeks ago.  Had surgery in early January.  Started Mounjaro in August.  Also on Levothyroxine 182mg daily.  Pt has noticed excessive loss in shower, when brushing, running hands through her hair.  No recent medication changes.  Has lost 30+ lbs.  + dry skin.  Not currently taking any vitamins. ? ? ?Review of Systems ?For ROS see HPI  ? ?This visit occurred during the SARS-CoV-2 public health emergency.  Safety protocols were in place, including screening questions prior to the visit, additional usage of staff PPE, and extensive cleaning of exam room while observing appropriate contact time as indicated for disinfecting solutions.   ?   ?Objective:  ? Physical Exam ?Vitals reviewed.  ?Constitutional:   ?   General: She is not in acute distress. ?   Appearance: Normal appearance. She is not ill-appearing.  ?HENT:  ?   Head: Normocephalic and atraumatic.  ?Eyes:  ?   Extraocular Movements: Extraocular movements intact.  ?   Conjunctiva/sclera: Conjunctivae normal.  ?   Pupils: Pupils are equal, round, and reactive to light.  ?Skin: ?   General: Skin is warm and dry.  ?   Comments: + hair loss  ?Neurological:  ?   General: No focal deficit present.  ?   Mental Status: She is alert and oriented to person, place, and time.  ?Psychiatric:     ?   Mood and Affect: Mood normal.     ?   Behavior: Behavior normal.     ?   Thought Content: Thought content normal.  ? ? ? ? ? ?   ?Assessment & Plan:  ? ?Hair loss- new.  Pt has recently lost 30+ lbs, had surgery (tummy tuck), and also has hypothyroid.  Multiple reasons for hair loss, but will check labs to r/o metabolic cause that can be corrected.  Encouraged pt to restart her multivitamin.  Pt expressed understanding and is in agreement w/ plan.  ?

## 2021-11-30 NOTE — Patient Instructions (Addendum)
Follow up as needed or as scheduled ?We'll notify you of your lab results and make any changes if needed ?Restart your multivitamins daily ?Call with any questions or concerns ?Stay Safe!  Stay Healthy! ?You look great!!! ?

## 2021-12-02 ENCOUNTER — Telehealth: Payer: Self-pay

## 2021-12-02 ENCOUNTER — Encounter: Payer: Self-pay | Admitting: Family Medicine

## 2021-12-02 ENCOUNTER — Other Ambulatory Visit: Payer: Self-pay

## 2021-12-02 MED ORDER — FLUTICASONE FUROATE-VILANTEROL 100-25 MCG/ACT IN AEPB: 1.0000 | INHALATION_SPRAY | Freq: Every day | RESPIRATORY_TRACT | 0 refills | Status: DC

## 2021-12-02 NOTE — Telephone Encounter (Signed)
Patient called in stating that the medication fluticasone furoate-vilanterol (BREO ELLIPTA) 100-25 MCG/ACT AEPB was never called in. Asked if we can re-fax it to the pharmacy on file.  ?

## 2021-12-02 NOTE — Telephone Encounter (Signed)
Rx sent, communicated via my chart ?

## 2021-12-05 LAB — COLOGUARD: COLOGUARD: NEGATIVE

## 2021-12-08 ENCOUNTER — Telehealth: Payer: Self-pay

## 2021-12-08 NOTE — Telephone Encounter (Signed)
Lvm for patient letting her know her cologuard results. Ask that she call back with any questions ?

## 2021-12-08 NOTE — Telephone Encounter (Signed)
-----   Message from [name], MD sent at 12/07/2021  7:21 AM EDT ----- ?Negative cologuard- great news! ?

## 2021-12-28 NOTE — Progress Notes (Signed)
56 y.o. [id number] Married Caucasian female here for annual exam.   ? ?Had an abdominoplasty and is happy with the result.  ?She had a prolonged course of perioperative antibiotics.  ? ?Excessive sweating of her buttock area.  ?Asking for potential treatment.  ? ?Her sister died of lung caner.  ? ?Daughter marrying in one year.  ? ?Going to [place] in August.  ? ?PCP:  Annye Asa, MD ? ?Patient's last menstrual period was 08/13/2016 (approximate).     ?  ?    ?Sexually active: Yes.    ?The current method of family planning is post menopausal status.    ?Exercising: No.  The patient does not participate in regular exercise at present. ?Smoker:  no ? ?Health Maintenance: ?Pap:  05-19-18 Neg:Neg HR HPV, 03/11/2015 Neg:Neg HR HPV, 12-14-12 Neg:Neg HR HPV ?History of abnormal Pap:  Yes, Hx of LEEP years ago. paps normal since ?MMG:  12-30-21 pend.[facility].12-12-20 Neg/Birads1 ?Colonoscopy: 11-27-21 Neg Cologuard ?BMD:   n/a  Result  n/a ?TDaP:  2014 ?Gardasil:   n/a ?HIV: Unsure ?Hep C:Unsure ?Screening Labs: PCP ? ? reports that she has never smoked. She has never used smokeless tobacco. She reports current alcohol use. She reports that she does not use drugs. ? ?Past Medical History:  ?Diagnosis Date  ? Clotting disorder ([place])   ? dvt in rt.leg 2016  ? Depression   ? DVT (deep venous thrombosis) ([place])   ? Dysplasia of cervix   ? Family history of breast cancer   ? Hyperlipidemia 08/2014  ? borderline  ? Migraine   ? STD (sexually transmitted disease)   ? HSV 2  ? Thyroid disease   ? Urinary incontinence   ? ? ?Past Surgical History:  ?Procedure Laterality Date  ? BREAST BIOPSY Left 2001  ? benign  ? CERVICAL BIOPSY  W/ LOOP ELECTRODE EXCISION    ? 16yr ago  ? COSMETIC SURGERY  09/13/2021  ? abdominoplasty  ? DILATION AND CURETTAGE OF UTERUS    ? age 56 ? INTRAUTERINE DEVICE (IUD) INSERTION  07/19/2014  ? Mirena  ? IUD REMOVAL    ? ? ?Current Outpatient Medications  ?Medication Sig Dispense Refill  ? albuterol  (VENTOLIN HFA) 108 (90 Base) MCG/ACT inhaler Inhale 2 puffs into the lungs every 6 (six) hours as needed for wheezing or shortness of breath. 18 g 1  ? Atogepant (QULIPTA) 30 MG TABS Take by mouth.    ? buPROPion (WELLBUTRIN XL) 300 MG 24 hr tablet Take 1 tablet (300 mg total) by mouth daily. 90 tablet 1  ? eletriptan (RELPAX) 40 MG tablet TAKE 1 TABLET BY MOUTH AS NEEDED FOR HEADACHE/ MIGRAINES(MAY REPEAT IN 2 HOURS IF HEADACHE PERSISTS OR RECURS) 10 tablet 0  ? fluticasone furoate-vilanterol (BREO ELLIPTA) 100-25 MCG/ACT AEPB Inhale 1 puff into the lungs daily. 2 each 0  ? levothyroxine (SYNTHROID) 112 MCG tablet Take 1 tablet (112 mcg total) by mouth daily. 90 tablet 1  ? MOUNJARO 12.5 MG/0.5ML Pen SMARTSIG:12.5 Milligram(s) SUB-Q Once a Week    ? Multiple Vitamin (MULTIVITAMIN) tablet Take 1 tablet by mouth daily.    ? ondansetron (ZOFRAN-ODT) 8 MG disintegrating tablet ondansetron 8 mg disintegrating tablet    ? ?No current facility-administered medications for this visit.  ? ? ?Family History  ?Problem Relation Age of Onset  ? Breast cancer Mother 648 ?     recurence of breast cancer early 65'sthen developed bone cancer ate 89/then developed  bone cancer from recurence age 60  ? Heart failure Father 60  ? Hypertension Father   ? Stroke Maternal Grandmother   ? Diabetes Maternal Grandfather   ? Diabetes Paternal Grandmother   ? Cervical cancer Sister 63  ? Cancer Sister 45  ?     Stage IV lung ca and brain tumor  ? Colon cancer Neg Hx   ? Colon polyps Neg Hx   ? Esophageal cancer Neg Hx   ? Rectal cancer Neg Hx   ? Stomach cancer Neg Hx   ? ? ?Review of Systems  ?All other systems reviewed and are negative. ? ?Exam:   ?BP 130/82   Pulse 81   Ht '5\' 6"'$  (1.676 m)   Wt 161 lb (73 kg)   LMP 08/13/2016 (Approximate)   SpO2 91%   BMI 25.99 kg/m?     ?General appearance: alert, cooperative and appears stated age ?Head: normocephalic, without obvious abnormality, atraumatic ?Neck: no adenopathy, supple,  symmetrical, trachea midline and thyroid normal to inspection and palpation ?Lungs: clear to auscultation bilaterally ?Breasts: normal appearance, no masses or tenderness, No nipple retraction or dimpling, No nipple discharge or bleeding, No axillary adenopathy ?Heart: regular rate and rhythm ?Abdomen: consistent with abdominoplasty, abdomen is soft, non-tender; no masses, no organomegaly ?Extremities: extremities normal, atraumatic, no cyanosis or edema ?Skin: skin color, texture, turgor normal. No rashes or lesions ?Lymph nodes: cervical, supraclavicular, and axillary nodes normal. ?Neurologic: grossly normal ? ?Pelvic: External genitalia:  no lesions ?             No abnormal inguinal nodes palpated. ?             Urethra:  normal appearing urethra with no masses, tenderness or lesions ?             Bartholins and Skenes: normal    ?             Vagina: normal appearing vagina with normal color and discharge, no lesions ?             Cervix: no lesions ?             Pap taken: no ?Bimanual Exam:  Uterus:  normal size, contour, position, consistency, mobility, non-tender ?             Adnexa: no mass, fullness, tenderness ?             Rectal exam: yes.  Confirms. Hemorrhoid noted.  ? ?Chaperone was present for exam:  yes.  ? ?Assessment:   ?Well woman visit with gynecologic exam. ?LEEP 30 years ago.  ?FH of breast and possible uterine cancer.   ?Hx DVT.  ?Hx HSV 2. ?Hemorrhoid. ?Vulvitis. ?Hyperhidrosis.  ? ?Plan: ?Mammogram screening discussed. ?Self breast awareness reviewed. ?Pap and HR HPV 2024. ?Guidelines for Calcium, Vitamin D, regular exercise program including cardiovascular and weight bearing exercise. ?Rx for Valtrex 500 mg po bid x 3 days.  ?Rx for Drysol.  Instructed in use.  ?I recommended [facility] vaccine and prophylactic medications prior to travel to [place].  ?Follow up annually and prn.  ? ?After visit summary provided.  ? ? ? ?

## 2021-12-29 DIAGNOSIS — G43719 Chronic migraine without aura, intractable, without status migrainosus: Secondary | ICD-10-CM | POA: Diagnosis not present

## 2021-12-30 ENCOUNTER — Ambulatory Visit (INDEPENDENT_AMBULATORY_CARE_PROVIDER_SITE_OTHER): Payer: BC Managed Care – PPO | Admitting: Obstetrics and Gynecology

## 2021-12-30 ENCOUNTER — Encounter: Payer: Self-pay | Admitting: Obstetrics and Gynecology

## 2021-12-30 ENCOUNTER — Other Ambulatory Visit: Payer: Self-pay

## 2021-12-30 VITALS — BP 130/82 | HR 81 | Ht 66.0 in | Wt 161.0 lb

## 2021-12-30 DIAGNOSIS — R61 Generalized hyperhidrosis: Secondary | ICD-10-CM | POA: Diagnosis not present

## 2021-12-30 DIAGNOSIS — Z01419 Encounter for gynecological examination (general) (routine) without abnormal findings: Secondary | ICD-10-CM

## 2021-12-30 DIAGNOSIS — Z1231 Encounter for screening mammogram for malignant neoplasm of breast: Secondary | ICD-10-CM | POA: Diagnosis not present

## 2021-12-30 DIAGNOSIS — E038 Other specified hypothyroidism: Secondary | ICD-10-CM

## 2021-12-30 MED ORDER — DRYSOL 20 % EX SOLN: Freq: Every day | CUTANEOUS | 1 refills | Status: DC

## 2021-12-30 MED ORDER — LEVOTHYROXINE SODIUM 112 MCG PO TABS: 112.0000 ug | ORAL_TABLET | Freq: Every day | ORAL | 1 refills | Status: DC

## 2021-12-30 MED ORDER — VALACYCLOVIR HCL 500 MG PO TABS: 500.0000 mg | ORAL_TABLET | Freq: Two times a day (BID) | ORAL | 1 refills | Status: DC

## 2021-12-30 NOTE — Patient Instructions (Signed)

## 2022-01-11 DIAGNOSIS — G43719 Chronic migraine without aura, intractable, without status migrainosus: Secondary | ICD-10-CM | POA: Diagnosis not present

## 2022-02-04 ENCOUNTER — Encounter: Payer: Self-pay | Admitting: Family Medicine

## 2022-02-15 ENCOUNTER — Encounter: Payer: Self-pay | Admitting: Family Medicine

## 2022-02-15 ENCOUNTER — Ambulatory Visit (INDEPENDENT_AMBULATORY_CARE_PROVIDER_SITE_OTHER): Payer: BC Managed Care – PPO | Admitting: Family Medicine

## 2022-02-15 VITALS — BP 122/72 | HR 80 | Temp 97.6°F | Resp 18 | Ht 66.0 in | Wt 157.8 lb

## 2022-02-15 DIAGNOSIS — F32A Depression, unspecified: Secondary | ICD-10-CM

## 2022-02-15 DIAGNOSIS — Z0001 Encounter for general adult medical examination with abnormal findings: Secondary | ICD-10-CM

## 2022-02-15 DIAGNOSIS — E038 Other specified hypothyroidism: Secondary | ICD-10-CM | POA: Diagnosis not present

## 2022-02-15 DIAGNOSIS — F419 Anxiety disorder, unspecified: Secondary | ICD-10-CM

## 2022-02-15 DIAGNOSIS — Z0000 Encounter for general adult medical examination without abnormal findings: Secondary | ICD-10-CM

## 2022-02-15 DIAGNOSIS — Z1159 Encounter for screening for other viral diseases: Secondary | ICD-10-CM

## 2022-02-15 DIAGNOSIS — Z01812 Encounter for preprocedural laboratory examination: Secondary | ICD-10-CM

## 2022-02-15 DIAGNOSIS — Z23 Encounter for immunization: Secondary | ICD-10-CM | POA: Diagnosis not present

## 2022-02-15 LAB — HEPATIC FUNCTION PANEL
ALT: 11 U/L (ref 0–35)
AST: 18 U/L (ref 0–37)
Albumin: 4.3 g/dL (ref 3.5–5.2)
Alkaline Phosphatase: 70 U/L (ref 39–117)
Bilirubin, Direct: 0.1 mg/dL (ref 0.0–0.3)
Total Bilirubin: 0.6 mg/dL (ref 0.2–1.2)
Total Protein: 7.2 g/dL (ref 6.0–8.3)

## 2022-02-15 LAB — CBC WITH DIFFERENTIAL/PLATELET
Basophils Absolute: 0.1 10*3/uL (ref 0.0–0.1)
Basophils Relative: 1.4 % (ref 0.0–3.0)
Eosinophils Absolute: 0.2 10*3/uL (ref 0.0–0.7)
Eosinophils Relative: 1.9 % (ref 0.0–5.0)
HCT: 43.2 % (ref 36.0–46.0)
Hemoglobin: 14.6 g/dL (ref 12.0–15.0)
Lymphocytes Relative: 22.3 % (ref 12.0–46.0)
Lymphs Abs: 1.8 10*3/uL (ref 0.7–4.0)
MCHC: 33.7 g/dL (ref 30.0–36.0)
MCV: 88.8 fl (ref 78.0–100.0)
Monocytes Absolute: 0.4 10*3/uL (ref 0.1–1.0)
Monocytes Relative: 5.1 % (ref 3.0–12.0)
Neutro Abs: 5.6 10*3/uL (ref 1.4–7.7)
Neutrophils Relative %: 69.3 % (ref 43.0–77.0)
Platelets: 343 10*3/uL (ref 150.0–400.0)
RBC: 4.87 Mil/uL (ref 3.87–5.11)
RDW: 13.9 % (ref 11.5–15.5)
WBC: 8.1 10*3/uL (ref 4.0–10.5)

## 2022-02-15 LAB — BASIC METABOLIC PANEL
BUN: 13 mg/dL (ref 6–23)
CO2: 28 mEq/L (ref 19–32)
Calcium: 9.8 mg/dL (ref 8.4–10.5)
Chloride: 105 mEq/L (ref 96–112)
Creatinine, Ser: 1.03 mg/dL (ref 0.40–1.20)
GFR: 60.74 mL/min (ref >60.00)
Glucose, Bld: 74 mg/dL (ref 70–99)
Potassium: 4.4 mEq/L (ref 3.5–5.1)
Sodium: 139 mEq/L (ref 135–145)

## 2022-02-15 LAB — PROTIME-INR
INR: 1 ratio (ref 0.8–1.0)
Prothrombin Time: 10.9 s (ref 9.6–13.1)

## 2022-02-15 LAB — LIPID PANEL
Cholesterol: 224 mg/dL — ABNORMAL HIGH (ref 0–200)
HDL: 62.8 mg/dL (ref >39.00)
LDL Cholesterol: 144 mg/dL — ABNORMAL HIGH (ref 0–99)
NonHDL: 161.25
Total CHOL/HDL Ratio: 4
Triglycerides: 88 mg/dL (ref 0.0–149.0)
VLDL: 17.6 mg/dL (ref 0.0–40.0)

## 2022-02-15 LAB — TSH: TSH: 0.85 u[IU]/mL (ref 0.35–5.50)

## 2022-02-15 MED ORDER — BUSPIRONE HCL 7.5 MG PO TABS: 7.5000 mg | ORAL_TABLET | Freq: Two times a day (BID) | ORAL | 3 refills | Status: DC

## 2022-02-15 NOTE — Progress Notes (Signed)
   Subjective:    Patient ID: Gwendolyn Shields, female    DOB: October 10, 1965, 56 y.o.   MRN: [id number]  HPI CPE- UTD on pap, mammo, cologuard, Tdap  Patient Care Team    Relationship Specialty Notifications Start End  [name], MD PCP - General Family Medicine  07/30/13   [name], MD Consulting Physician Psychiatry  10/20/18     Health Maintenance  Topic Date Due   Hepatitis C Screening  Never done   COVID-19 Vaccine (3 - Booster for [place] series) 03/03/2022 (Originally 02/25/2020)   Zoster Vaccines- Shingrix (1 of 2) 05/18/2022 (Originally 07/07/2015)   MAMMOGRAM  02/16/2023 (Originally 12/31/2022)   HIV Screening  02/16/2023 (Originally 07/06/1980)   INFLUENZA VACCINE  04/13/2022   TETANUS/TDAP  07/20/2022   PAP SMEAR-Modifier  05/20/2023   Fecal DNA (Cologuard)  11/27/2024   HPV VACCINES  Aged Out      Review of Systems Patient reports no vision/ hearing changes, adenopathy,fever, weight change,  persistant/recurrent hoarseness , swallowing issues, chest pain, palpitations, edema, persistant/recurrent cough, hemoptysis, dyspnea (rest/exertional/paroxysmal nocturnal), gastrointestinal bleeding (melena, rectal bleeding), abdominal pain, significant heartburn, bowel changes, GU symptoms (dysuria, hematuria, incontinence), Gyn symptoms (abnormal  bleeding, pain),  syncope, focal weakness, memory loss, numbness & tingling, skin/hair/nail changes, abnormal bruising or bleeding.   'i'm really anxious and I don't know why'- work is 'very stressful'.  Pt is already on Wellbutrin.  Feels sxs have considerably worsened in the last 2 months.  Has very low energy or motivation to do anything after work.  Dreads going to work.  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  Is currently job hunting.    Objective:   Physical Exam General Appearance:    Alert, cooperative, no distress, appears stated age  Head:    Normocephalic, without obvious abnormality,  atraumatic  Eyes:    PERRL, conjunctiva/corneas clear, EOM's intact both eyes  Ears:    Normal TM's and external ear canals, both ears  Nose:   Nares normal, septum midline, mucosa normal, no drainage    or sinus tenderness  Throat:   Lips, mucosa, and tongue normal; teeth and gums normal  Neck:   Supple, symmetrical, trachea midline, no adenopathy;    Thyroid: no enlargement/tenderness/nodules  Back:     Symmetric, no curvature, ROM normal, no CVA tenderness  Lungs:     Clear to auscultation bilaterally, respirations unlabored  Chest Wall:    No tenderness or deformity   Heart:    Regular rate and rhythm, S1 and S2 normal, no murmur, rub   or gallop  Breast Exam:    Deferred to GYN  Abdomen:     Soft, non-tender, bowel sounds active all four quadrants,    no masses, no organomegaly  Genitalia:    Deferred to GYN  Rectal:    Extremities:   Extremities normal, atraumatic, no cyanosis or edema  Pulses:   2+ and symmetric all extremities  Skin:   Skin color, texture, turgor normal, no rashes or lesions  Lymph nodes:   Cervical, supraclavicular, and axillary nodes normal  Neurologic:   CNII-XII intact, normal strength, sensation and reflexes    throughout          Assessment & Plan:

## 2022-02-15 NOTE — Assessment & Plan Note (Signed)
Deteriorated.  Pt reports having increased anxiety.  Feels that most of this is stemming from work but is carrying over into her personal life as well.  Has little energy or motivation to do anything after work.  Dreads going in.  Will add Buspar and monitor closely for improvement.

## 2022-02-15 NOTE — Assessment & Plan Note (Signed)
Pt's PE WNL.  UTD on pap, mammo, cologuard.  Tdap and HepA/B given today in preparation for upcoming trip to [place].  Check labs.  Anticipatory guidance provided.

## 2022-02-15 NOTE — Patient Instructions (Addendum)
Follow up in 4-6 weeks to recheck mood and get 2nd Hep A/B shot We'll notify you of your lab results and make any changes if needed Keep up the good work on healthy diet and regular exercise- you look great!!! START the Buspar (Buspirone) twice daily in addition to the Wellbutrin Call with any questions or concerns Stay Safe!  Stay Healthy! Have a great summer!!!

## 2022-02-15 NOTE — Assessment & Plan Note (Signed)
Check labs and adjust levothyroxine prn.

## 2022-02-16 LAB — HEPATITIS C ANTIBODY
Hepatitis C Ab: NONREACTIVE
SIGNAL TO CUT-OFF: 0.03 (ref <1.00)

## 2022-02-17 ENCOUNTER — Telehealth: Payer: Self-pay

## 2022-02-17 ENCOUNTER — Other Ambulatory Visit: Payer: Self-pay

## 2022-02-17 NOTE — Telephone Encounter (Signed)
PA for Buspirone has been submitted

## 2022-02-17 NOTE — Telephone Encounter (Signed)
-----   Message from [name], MD sent at 02/17/2022  4:40 PM EDT ----- Labs look great w/ exception of total cholesterol and LDL (bad cholesterol) both of which are above goal.  Thankfully, your HDL (good cholesterol) is so good that the ratio of good to bad is still in range.  No meds at this time but continue to work on healthy diet and regular exercise.

## 2022-02-19 NOTE — Telephone Encounter (Signed)
I don't know why this was denied as this medication is generic.  But I did check goodrx.com and it looks like if we change the Buspirone to '10mg'$  twice daily she can get it through goodrx at [facility] for $4.  Other local pharmacies also have deeply discounted rates through goodrx.  Please have pt look into it and let me know where we should send the medication

## 2022-02-22 NOTE — Telephone Encounter (Signed)
Spoke to the pt and advised she can use the Good rx . Pt states she is going to use it today at [facility]

## 2022-03-22 ENCOUNTER — Ambulatory Visit: Payer: BC Managed Care – PPO | Admitting: Family Medicine

## 2022-03-29 ENCOUNTER — Encounter (INDEPENDENT_AMBULATORY_CARE_PROVIDER_SITE_OTHER): Payer: BC Managed Care – PPO | Admitting: Family Medicine

## 2022-03-29 DIAGNOSIS — Z7184 Encounter for health counseling related to travel: Secondary | ICD-10-CM | POA: Diagnosis not present

## 2022-03-29 DIAGNOSIS — G43719 Chronic migraine without aura, intractable, without status migrainosus: Secondary | ICD-10-CM | POA: Diagnosis not present

## 2022-03-29 MED ORDER — TYPHOID VACCINE PO CPDR: 1.0000 | DELAYED_RELEASE_CAPSULE | ORAL | 0 refills | Status: DC

## 2022-03-29 MED ORDER — ATOVAQUONE-PROGUANIL HCL 250-100 MG PO TABS: 1.0000 | ORAL_TABLET | Freq: Every day | ORAL | 0 refills | Status: DC

## 2022-03-29 NOTE — Telephone Encounter (Signed)
[facility] VISIT   Patient agreed to [facility] visit and is aware that copayment and coinsurance may apply. Patient was treated using telemedicine according to accepted telemedicine protocols.  Subjective:   Patient complains of needing medication for upcoming travel to [place]  Patient Active Problem List   Diagnosis Date Noted   History of DVT (deep vein thrombosis) 06/02/2021   Chronic cough 07/25/2020   Family history of breast cancer    Obstructive sleep apnea 06/25/2016   Insomnia 06/25/2016   Hyperlipidemia 03/21/2015   Allergic rhinitis 10/08/2014   Medication side effect 10/08/2014   Hypothyroidism 04/19/2014   Anxiety and depression 01/17/2014   Osteoarthritis of CMC joint of thumb 11/28/2013   Routine general medical examination at a health care facility 07/20/2013   History of cervical cancer 05/21/2010   ASTHMA, UNSPECIFIED 05/21/2010   Migraine 05/21/2010   Genital herpes 12/18/2007   Social History   Tobacco Use   Smoking status: Never   Smokeless tobacco: Never  Substance Use Topics   Alcohol use: Yes    Comment: 2 drinks/month    Current Outpatient Medications:    atovaquone-proguanil (MALARONE) 250-100 MG TABS tablet, Take 1 tablet by mouth daily. Start 1-2 days prior to travel and take for 7 days after return, Disp: 24 tablet, Rfl: 0   typhoid (VIVOTIF) DR capsule, Take 1 capsule by mouth every other day. Complete at least 1 week prior to travel, Disp: 4 capsule, Rfl: 0   albuterol (VENTOLIN HFA) 108 (90 Base) MCG/ACT inhaler, Inhale 2 puffs into the lungs every 6 (six) hours as needed for wheezing or shortness of breath., Disp: 18 g, Rfl: 1   aluminum chloride (DRYSOL) 20 % external solution, Apply topically at bedtime., Disp: 60 mL, Rfl: 1   Atogepant (QULIPTA) 30 MG TABS, Take by mouth., Disp: , Rfl:    buPROPion (WELLBUTRIN XL) 300 MG 24 hr tablet, Take 1 tablet (300 mg total) by mouth daily., Disp: 90 tablet, Rfl: 1   busPIRone (BUSPAR) 7.5 MG  tablet, Take 1 tablet (7.5 mg total) by mouth 2 (two) times daily., Disp: 60 tablet, Rfl: 3   eletriptan (RELPAX) 40 MG tablet, TAKE 1 TABLET BY MOUTH AS NEEDED FOR HEADACHE/ MIGRAINES(MAY REPEAT IN 2 HOURS IF HEADACHE PERSISTS OR RECURS), Disp: 10 tablet, Rfl: 0   fluticasone furoate-vilanterol (BREO ELLIPTA) 100-25 MCG/ACT AEPB, Inhale 1 puff into the lungs daily., Disp: 2 each, Rfl: 0   levothyroxine (SYNTHROID) 112 MCG tablet, Take 1 tablet (112 mcg total) by mouth daily., Disp: 90 tablet, Rfl: 1   MOUNJARO 12.5 MG/0.5ML Pen, SMARTSIG:12.5 Milligram(s) SUB-Q Once a Week, Disp: , Rfl:    Multiple Vitamin (MULTIVITAMIN) tablet, Take 1 tablet by mouth daily., Disp: , Rfl:    ondansetron (ZOFRAN-ODT) 8 MG disintegrating tablet, ondansetron 8 mg disintegrating tablet, Disp: , Rfl:    valACYclovir (VALTREX) 500 MG tablet, Take 1 tablet (500 mg total) by mouth 2 (two) times daily. Take for 3 days as needed for an outbreak., Disp: 30 tablet, Rfl: 1  Allergies  Allergen Reactions   Other Rash    TAPE   Lipitor [Atorvastatin] Nausea Only and Other (See Comments)    HA   Adhesive [Tape] Rash   Latex Rash    Assessment and Plan:   Diagnosis: Travel Advice. Please see myChart communication and orders below.   No orders of the defined types were placed in this encounter.  Meds ordered this encounter  Medications   atovaquone-proguanil (MALARONE) 250-100 MG  TABS tablet    Sig: Take 1 tablet by mouth daily. Start 1-2 days prior to travel and take for 7 days after return    Dispense:  24 tablet    Refill:  0   typhoid (VIVOTIF) DR capsule    Sig: Take 1 capsule by mouth every other day. Complete at least 1 week prior to travel    Dispense:  4 capsule    Refill:  0    Annye Asa, MD 03/29/2022  A total of 8 minutes were spent by me to personally review the patient-generated inquiry, review patient records and data pertinent to assessment of the patient's problem, develop a management  plan including generation of prescriptions and/or orders, and on subsequent communication with the patient through secure the MyChart portal service.   There is no separately reported E/M service related to this service in the past 7 days nor does the patient have an upcoming soonest available appointment for this issue. This work was completed in less than 7 days.   The patient consented to this service today (see patient agreement prior to ongoing communication). Patient counseled regarding the need for in-person exam for certain conditions and was advised to call the office if any changing or worsening symptoms occur.   The codes to be used for the E/M service are: '[x]'$   99421 for 5-10 minutes of time spent on the inquiry. '[]'$   [id number] for 11-20 minutes. '[]'$   [id number] for 21+ minutes.

## 2022-03-30 ENCOUNTER — Ambulatory Visit (INDEPENDENT_AMBULATORY_CARE_PROVIDER_SITE_OTHER): Payer: BC Managed Care – PPO | Admitting: Family Medicine

## 2022-03-30 DIAGNOSIS — Z23 Encounter for immunization: Secondary | ICD-10-CM

## 2022-03-30 DIAGNOSIS — Z9229 Personal history of other drug therapy: Secondary | ICD-10-CM

## 2022-03-30 NOTE — Progress Notes (Signed)
Pt presented for 2nd Hep A/B combination vaccine. Pt reported no concerns with first administration

## 2022-04-02 ENCOUNTER — Ambulatory Visit: Payer: BC Managed Care – PPO | Admitting: Family Medicine

## 2022-04-05 ENCOUNTER — Other Ambulatory Visit: Payer: Self-pay

## 2022-04-05 MED ORDER — BUPROPION HCL ER (XL) 300 MG PO TB24: 300.0000 mg | ORAL_TABLET | Freq: Every day | ORAL | 1 refills | Status: DC

## 2022-04-14 DIAGNOSIS — G43719 Chronic migraine without aura, intractable, without status migrainosus: Secondary | ICD-10-CM | POA: Diagnosis not present

## 2022-07-05 DIAGNOSIS — G43719 Chronic migraine without aura, intractable, without status migrainosus: Secondary | ICD-10-CM | POA: Diagnosis not present

## 2022-07-14 DIAGNOSIS — G43719 Chronic migraine without aura, intractable, without status migrainosus: Secondary | ICD-10-CM | POA: Diagnosis not present

## 2022-08-18 DIAGNOSIS — H04123 Dry eye syndrome of bilateral lacrimal glands: Secondary | ICD-10-CM | POA: Diagnosis not present

## 2022-08-31 ENCOUNTER — Other Ambulatory Visit: Payer: Self-pay

## 2022-08-31 DIAGNOSIS — E038 Other specified hypothyroidism: Secondary | ICD-10-CM

## 2022-08-31 MED ORDER — LEVOTHYROXINE SODIUM 112 MCG PO TABS: 112.0000 ug | ORAL_TABLET | Freq: Every day | ORAL | 1 refills | Status: DC

## 2022-10-26 ENCOUNTER — Other Ambulatory Visit: Payer: Self-pay

## 2022-10-26 DIAGNOSIS — F32A Depression, unspecified: Secondary | ICD-10-CM

## 2022-10-26 MED ORDER — BUPROPION HCL ER (XL) 300 MG PO TB24: 300.0000 mg | ORAL_TABLET | Freq: Every day | ORAL | 1 refills | Status: DC

## 2022-11-09 DIAGNOSIS — G43001 Migraine without aura, not intractable, with status migrainosus: Secondary | ICD-10-CM | POA: Diagnosis not present

## 2022-12-08 DIAGNOSIS — G43719 Chronic migraine without aura, intractable, without status migrainosus: Secondary | ICD-10-CM | POA: Diagnosis not present

## 2022-12-16 DIAGNOSIS — L245 Irritant contact dermatitis due to other chemical products: Secondary | ICD-10-CM | POA: Diagnosis not present

## 2022-12-16 DIAGNOSIS — L218 Other seborrheic dermatitis: Secondary | ICD-10-CM | POA: Diagnosis not present

## 2022-12-16 DIAGNOSIS — G43719 Chronic migraine without aura, intractable, without status migrainosus: Secondary | ICD-10-CM | POA: Diagnosis not present

## 2023-01-24 DIAGNOSIS — L218 Other seborrheic dermatitis: Secondary | ICD-10-CM | POA: Diagnosis not present

## 2023-01-24 DIAGNOSIS — D224 Melanocytic nevi of scalp and neck: Secondary | ICD-10-CM | POA: Diagnosis not present

## 2023-01-24 DIAGNOSIS — L84 Corns and callosities: Secondary | ICD-10-CM | POA: Diagnosis not present

## 2023-01-24 DIAGNOSIS — D225 Melanocytic nevi of trunk: Secondary | ICD-10-CM | POA: Diagnosis not present

## 2023-02-01 ENCOUNTER — Encounter: Payer: Self-pay | Admitting: Family Medicine

## 2023-02-09 DIAGNOSIS — G43001 Migraine without aura, not intractable, with status migrainosus: Secondary | ICD-10-CM | POA: Diagnosis not present

## 2023-02-22 ENCOUNTER — Encounter: Payer: Self-pay | Admitting: Family Medicine

## 2023-02-22 ENCOUNTER — Ambulatory Visit (INDEPENDENT_AMBULATORY_CARE_PROVIDER_SITE_OTHER): Payer: BC Managed Care – PPO | Admitting: Family Medicine

## 2023-02-22 VITALS — BP 118/76 | HR 80 | Temp 98.0°F | Resp 18 | Ht 66.0 in | Wt 160.2 lb

## 2023-02-22 DIAGNOSIS — Z0000 Encounter for general adult medical examination without abnormal findings: Secondary | ICD-10-CM

## 2023-02-22 DIAGNOSIS — Z1231 Encounter for screening mammogram for malignant neoplasm of breast: Secondary | ICD-10-CM

## 2023-02-22 DIAGNOSIS — F32A Depression, unspecified: Secondary | ICD-10-CM

## 2023-02-22 DIAGNOSIS — F419 Anxiety disorder, unspecified: Secondary | ICD-10-CM

## 2023-02-22 DIAGNOSIS — E038 Other specified hypothyroidism: Secondary | ICD-10-CM

## 2023-02-22 LAB — LIPID PANEL
Cholesterol: 228 mg/dL — ABNORMAL HIGH (ref 0–200)
HDL: 56.4 mg/dL (ref >39.00)
LDL Cholesterol: 145 mg/dL — ABNORMAL HIGH (ref 0–99)
NonHDL: 171.84
Total CHOL/HDL Ratio: 4
Triglycerides: 135 mg/dL (ref 0.0–149.0)
VLDL: 27 mg/dL (ref 0.0–40.0)

## 2023-02-22 LAB — CBC WITH DIFFERENTIAL/PLATELET
Basophils Absolute: 0.1 10*3/uL (ref 0.0–0.1)
Basophils Relative: 1 % (ref 0.0–3.0)
Eosinophils Absolute: 0.1 10*3/uL (ref 0.0–0.7)
Eosinophils Relative: 1.8 % (ref 0.0–5.0)
HCT: 43.5 % (ref 36.0–46.0)
Hemoglobin: 14.4 g/dL (ref 12.0–15.0)
Lymphocytes Relative: 20.8 % (ref 12.0–46.0)
Lymphs Abs: 1.7 10*3/uL (ref 0.7–4.0)
MCHC: 33.1 g/dL (ref 30.0–36.0)
MCV: 90.4 fl (ref 78.0–100.0)
Monocytes Absolute: 0.4 10*3/uL (ref 0.1–1.0)
Monocytes Relative: 5.2 % (ref 3.0–12.0)
Neutro Abs: 5.7 10*3/uL (ref 1.4–7.7)
Neutrophils Relative %: 71.2 % (ref 43.0–77.0)
Platelets: 371 10*3/uL (ref 150.0–400.0)
RBC: 4.82 Mil/uL (ref 3.87–5.11)
RDW: 13.3 % (ref 11.5–15.5)
WBC: 8 10*3/uL (ref 4.0–10.5)

## 2023-02-22 LAB — BASIC METABOLIC PANEL
BUN: 12 mg/dL (ref 6–23)
CO2: 30 mEq/L (ref 19–32)
Calcium: 10 mg/dL (ref 8.4–10.5)
Chloride: 103 mEq/L (ref 96–112)
Creatinine, Ser: 1.03 mg/dL (ref 0.40–1.20)
GFR: 60.31 mL/min (ref >60.00)
Glucose, Bld: 66 mg/dL — ABNORMAL LOW (ref 70–99)
Potassium: 4.3 mEq/L (ref 3.5–5.1)
Sodium: 139 mEq/L (ref 135–145)

## 2023-02-22 LAB — HEPATIC FUNCTION PANEL
ALT: 11 U/L (ref 0–35)
AST: 17 U/L (ref 0–37)
Albumin: 4.4 g/dL (ref 3.5–5.2)
Alkaline Phosphatase: 50 U/L (ref 39–117)
Bilirubin, Direct: 0.1 mg/dL (ref 0.0–0.3)
Total Bilirubin: 0.4 mg/dL (ref 0.2–1.2)
Total Protein: 7.2 g/dL (ref 6.0–8.3)

## 2023-02-22 MED ORDER — BUPROPION HCL ER (XL) 300 MG PO TB24: 300.0000 mg | ORAL_TABLET | Freq: Every day | ORAL | 1 refills | Status: DC

## 2023-02-22 MED ORDER — LEVOTHYROXINE SODIUM 112 MCG PO TABS: 112.0000 ug | ORAL_TABLET | Freq: Every day | ORAL | 1 refills | Status: DC

## 2023-02-22 MED ORDER — FLUTICASONE FUROATE-VILANTEROL 100-25 MCG/ACT IN AEPB: 1.0000 | INHALATION_SPRAY | Freq: Every day | RESPIRATORY_TRACT | 0 refills | Status: AC

## 2023-02-22 NOTE — Patient Instructions (Addendum)
Follow up in 1 year or as needed We'll notify you of your lab results and make any changes if needed Keep up the good work on healthy diet and regular exercise- you look great! Call with any questions or concerns Stay Safe!  Stay Healthy! HAVE A GREAT SUMMER!!!

## 2023-02-22 NOTE — Progress Notes (Signed)
   Subjective:    Patient ID: Gwendolyn Shields, female    DOB: [birth year], 57 y.o.   MRN: [id number]  HPI CPE- due for mammo.  UTD on pap, cologuard, Tdap  Patient Care Team    Relationship Specialty Notifications Start End  Sheliah Hatch, MD PCP - General Family Medicine  07/30/13   Milagros Evener, MD Consulting Physician Psychiatry  10/20/18     Health Maintenance  Topic Date Due   MAMMOGRAM  12/31/2022   Zoster Vaccines- Shingrix (1 of 2) 05/25/2023 (Originally 07/07/2015)   INFLUENZA VACCINE  04/14/2023   PAP SMEAR-Modifier  05/20/2023   Fecal DNA (Cologuard)  11/27/2024   DTaP/Tdap/Td (2 - Td or Tdap) 02/16/2032   Hepatitis C Screening  Completed   HPV VACCINES  Aged Out   COVID-19 Vaccine  Discontinued   HIV Screening  Discontinued      Review of Systems Patient reports no hearing changes, adenopathy,fever, weight change,  persistant/recurrent hoarseness , swallowing issues, chest pain, palpitations, edema, persistant/recurrent cough, hemoptysis, dyspnea (rest/exertional/paroxysmal nocturnal), gastrointestinal bleeding (melena, rectal bleeding), abdominal pain, significant heartburn, bowel changes, GU symptoms (dysuria, hematuria, incontinence), Gyn symptoms (abnormal  bleeding, pain),  syncope, focal weakness, memory loss, numbness & tingling, skin/hair/nail changes, abnormal bruising or bleeding, anxiety, or depression.   + visual changes- dry eye    Objective:   Physical Exam General Appearance:    Alert, cooperative, no distress, appears stated age  Head:    Normocephalic, without obvious abnormality, atraumatic  Eyes:    PERRL, conjunctiva/corneas clear, EOM's intact both eyes  Ears:    Normal TM's and external ear canals, both ears  Nose:   Nares normal, septum midline, mucosa normal, no drainage    or sinus tenderness  Throat:   Lips, mucosa, and tongue normal; teeth and gums normal  Neck:   Supple, symmetrical, trachea midline, no adenopathy;    Thyroid:  no enlargement/tenderness/nodules  Back:     Symmetric, no curvature, ROM normal, no CVA tenderness  Lungs:     Clear to auscultation bilaterally, respirations unlabored  Chest Wall:    No tenderness or deformity   Heart:    Regular rate and rhythm, S1 and S2 normal, no murmur, rub   or gallop  Breast Exam:    Deferred to GYN  Abdomen:     Soft, non-tender, bowel sounds active all four quadrants,    no masses, no organomegaly  Genitalia:    Deferred to GYN  Rectal:    Extremities:   Extremities normal, atraumatic, no cyanosis or edema  Pulses:   2+ and symmetric all extremities  Skin:   Skin color, texture, turgor normal, no rashes or lesions  Lymph nodes:   Cervical, supraclavicular, and axillary nodes normal  Neurologic:   CNII-XII intact, normal strength, sensation and reflexes    throughout          Assessment & Plan:

## 2023-02-22 NOTE — Assessment & Plan Note (Signed)
Check labs and adjust meds prn. 

## 2023-02-22 NOTE — Assessment & Plan Note (Signed)
Pt's PE WNL.  Due for mammo- ordered.  UTD on pap, cologuard, Tdap.  Check labs.  Anticipatory guidance provided.

## 2023-02-23 ENCOUNTER — Telehealth: Payer: Self-pay

## 2023-02-23 LAB — TSH: TSH: 1.04 u[IU]/mL (ref 0.35–5.50)

## 2023-02-23 MED ORDER — ZEPBOUND 12.5 MG/0.5ML ~~LOC~~ SOAJ: 12.5000 mg | SUBCUTANEOUS | 1 refills | Status: DC

## 2023-02-23 NOTE — Telephone Encounter (Signed)
Pt states at her visit the discussion of Zepbound was discussed . She is asking for the Rx to be sent [facility] in [place] ,[place] Address is [address] in [place] ,[place]  She is asking for 12.5 mg

## 2023-02-23 NOTE — Telephone Encounter (Signed)
-----   Message from Sheliah Hatch, MD sent at 02/23/2023  7:43 AM EDT ----- Total cholesterol and LDL (bad cholesterol) are both higher than goal.  Thankfully your ratio of good to bad cholesterol still falls in the 'average risk' category.  This will improve w/ healthy diet and regular exercise.  You can also add OTC Red Yeast Rice supplement daily to help improve these numbers.  Remainder of labs look great!

## 2023-02-23 NOTE — Telephone Encounter (Signed)
Zepbound sent to requested pharmacy

## 2023-02-23 NOTE — Telephone Encounter (Signed)
I have made pt aware Rx has been sent in

## 2023-02-23 NOTE — Telephone Encounter (Signed)
Pt aware of lab results 

## 2023-02-23 NOTE — Telephone Encounter (Signed)
-----   Message from Katherine E Tabori, MD sent at 02/23/2023  7:43 AM EDT ----- Total cholesterol and LDL (bad cholesterol) are both higher than goal.  Thankfully your ratio of good to bad cholesterol still falls in the 'average risk' category.  This will improve w/ healthy diet and regular exercise.  You can also add OTC Red Yeast Rice supplement daily to help improve these numbers.  Remainder of labs look great! 

## 2023-03-29 DIAGNOSIS — G43719 Chronic migraine without aura, intractable, without status migrainosus: Secondary | ICD-10-CM | POA: Diagnosis not present

## 2023-03-30 ENCOUNTER — Telehealth: Payer: Self-pay

## 2023-03-30 NOTE — Telephone Encounter (Signed)
BS pt LVM in triage line inquiring if we could verify when her last mammo was?

## 2023-03-30 NOTE — Telephone Encounter (Signed)
Last mmg was 12-30-21 (care everywhere). Patient is aware. She has mmg scheduled for 04-05-23.

## 2023-04-05 ENCOUNTER — Ambulatory Visit: Admission: RE | Admit: 2023-04-05 | Payer: BC Managed Care – PPO | Source: Ambulatory Visit

## 2023-04-05 DIAGNOSIS — Z1231 Encounter for screening mammogram for malignant neoplasm of breast: Secondary | ICD-10-CM | POA: Diagnosis not present

## 2023-04-08 DIAGNOSIS — G43719 Chronic migraine without aura, intractable, without status migrainosus: Secondary | ICD-10-CM | POA: Diagnosis not present

## 2023-04-21 DIAGNOSIS — M79644 Pain in right finger(s): Secondary | ICD-10-CM | POA: Diagnosis not present

## 2023-04-21 DIAGNOSIS — M180 Bilateral primary osteoarthritis of first carpometacarpal joints: Secondary | ICD-10-CM | POA: Diagnosis not present

## 2023-04-22 ENCOUNTER — Telehealth: Payer: Self-pay | Admitting: Family Medicine

## 2023-04-22 NOTE — Telephone Encounter (Signed)
 Placed in Dr.Tabori folder

## 2023-04-22 NOTE — Telephone Encounter (Signed)
Pt Gwendolyn Shields dropped Wellness for [employer] Form for her employer.   Charge sheet attached and placed in front bin.

## 2023-04-27 NOTE — Progress Notes (Signed)
57 y.o. [id number] Married Caucasian female here for annual exam.    Lost 60 pounds.   Has more energy.  PCP is following her for this.   She bought an over the counter product with "estriol" in it.  She had a DVT while on birth control.  She had a negative hypercoagulability work up in 2018 with Dr. Pamelia Hoit.   Negative for Factor V Leiden 08/12/21.  She is considering genetic testing due to family history of cancers.  PCP:   Dr. Beverely Low  Patient's last menstrual period was 02/27/2016.           Sexually active: Yes.    The current method of family planning is post menopausal status.    Exercising: Yes.     walking Smoker:  no  Health Maintenance: Pap:  05/19/18 neg: HR HPV neg History of abnormal Pap:  yes, hx of LEEP years ago  MMG:  04/05/23 Breast Density cat B, BI-RADS CAT 1 neg Colonoscopy:  cologuard 11/27/21 neg BMD:   n/a  Result  n/a TDaP:  02/15/22 Gardasil:   no HIV: unsure Hep C: 02/15/22 NR Screening Labs:  PCP   reports that she has never smoked. She has never used smokeless tobacco. She reports current alcohol use. She reports that she does not use drugs.  Past Medical History:  Diagnosis Date   Clotting disorder (HCC)    dvt in rt.leg 2016   Depression    DVT (deep venous thrombosis) (HCC)    Dysplasia of cervix    Family history of breast cancer    Hyperlipidemia 08/2014   borderline   Migraine    STD (sexually transmitted disease)    HSV 2   Thyroid disease    Urinary incontinence     Past Surgical History:  Procedure Laterality Date   BREAST BIOPSY Left 2001   benign   CERVICAL BIOPSY  W/ LOOP ELECTRODE EXCISION     24yrs ago   COSMETIC SURGERY  09/13/2021   abdominoplasty   DILATION AND CURETTAGE OF UTERUS     age 56   INTRAUTERINE DEVICE (IUD) INSERTION  07/19/2014   Mirena   IUD REMOVAL      Current Outpatient Medications  Medication Sig Dispense Refill   albuterol (VENTOLIN HFA) 108 (90 Base) MCG/ACT inhaler Inhale 2 puffs into the  lungs every 6 (six) hours as needed for wheezing or shortness of breath. 18 g 1   aluminum chloride (DRYSOL) 20 % external solution Apply topically at bedtime. 60 mL 1   Atogepant (QULIPTA) 30 MG TABS Take by mouth.     buPROPion (WELLBUTRIN XL) 300 MG 24 hr tablet Take 1 tablet (300 mg total) by mouth daily. 90 tablet 1   busPIRone (BUSPAR) 7.5 MG tablet Take 1 tablet (7.5 mg total) by mouth 2 (two) times daily. 60 tablet 3   eletriptan (RELPAX) 40 MG tablet TAKE 1 TABLET BY MOUTH AS NEEDED FOR HEADACHE/ MIGRAINES(MAY REPEAT IN 2 HOURS IF HEADACHE PERSISTS OR RECURS) 10 tablet 0   fluticasone furoate-vilanterol (BREO ELLIPTA) 100-25 MCG/ACT AEPB Inhale 1 puff into the lungs daily. 2 each 0   levothyroxine (SYNTHROID) 112 MCG tablet Take 1 tablet (112 mcg total) by mouth daily. 90 tablet 1   lisdexamfetamine (VYVANSE) 40 MG capsule Take 40 mg by mouth every morning.     Multiple Vitamin (MULTIVITAMIN) tablet Take 1 tablet by mouth daily.     ondansetron (ZOFRAN-ODT) 8 MG disintegrating tablet ondansetron 8 mg disintegrating tablet  tirzepatide (ZEPBOUND) 12.5 MG/0.5ML Pen Inject 12.5 mg into the skin once a week. 6 mL 1   valACYclovir (VALTREX) 500 MG tablet Take 1 tablet (500 mg total) by mouth 2 (two) times daily. Take for 3 days as needed for an outbreak. 30 tablet 1   No current facility-administered medications for this visit.    Family History  Problem Relation Age of Onset   Breast cancer Mother 69       recurence of breast cancer early 32's then developed bone cancer ate 28 /then developed bone cancer from recurence age 47   Heart failure Father 74   Hypertension Father    Stroke Maternal Grandmother    Diabetes Maternal Grandfather    Diabetes Paternal Grandmother    Cervical cancer Sister 28   Cancer Sister 74       Stage IV lung ca and brain tumor   Colon cancer Neg Hx    Colon polyps Neg Hx    Esophageal cancer Neg Hx    Rectal cancer Neg Hx    Stomach cancer Neg Hx      Review of Systems  All other systems reviewed and are negative.   Exam:   BP 124/80 (BP Location: Left Arm, Patient Position: Sitting, Cuff Size: Normal)   Pulse 61   Ht 5\' 6"  (1.676 m)   Wt 162 lb (73.5 kg)   LMP 02/27/2016   SpO2 97%   BMI 26.15 kg/m     General appearance: alert, cooperative and appears stated age Head: normocephalic, without obvious abnormality, atraumatic Neck: no adenopathy, supple, symmetrical, trachea midline and thyroid normal to inspection and palpation Lungs: clear to auscultation bilaterally Breasts: normal appearance, no masses or tenderness, No nipple retraction or dimpling, No nipple discharge or bleeding, No axillary adenopathy Heart: regular rate and rhythm Abdomen: soft, non-tender; no masses, no organomegaly Extremities: extremities normal, atraumatic, no cyanosis or edema Skin: skin color, texture, turgor normal. No rashes or lesions Lymph nodes: cervical, supraclavicular, and axillary nodes normal. Neurologic: grossly normal  Pelvic: External genitalia:  no lesions              No abnormal inguinal nodes palpated.              Urethra:  normal appearing urethra with no masses, tenderness or lesions              Bartholins and Skenes: normal                 Vagina: normal appearing vagina with normal color and discharge, no lesions              Cervix: no lesions              Pap taken: yes Bimanual Exam:  Uterus:  normal size, contour, position, consistency, mobility, non-tender              Adnexa: no mass, fullness, tenderness              Rectal exam: yes.  Confirms.              Anus:  normal sphincter tone, hemorrhoid noted.   Chaperone was present for exam:  Warren Lacy, CMA  Assessment:   Well woman visit with gynecologic exam. LEEP about 30 years ago.  FH of breast and possible uterine cancer.   Hx DVT.  Hx HSV 2. Hyperhidrosis.   Plan: Mammogram screening discussed. Self breast awareness reviewed. Pap and HR HPV  collected. Guidelines for Calcium, Vitamin D, regular exercise program including cardiovascular and weight bearing exercise. She was educated that "estriol" is an estrogen, but that it is weaker than estradiol. I will contact Dr. Pamelia Hoit about her potential use of vaginal "estriol" and get his opinion.  We discussed vaginal vit E as an alternative.  Refill of Valtrex.  She may consider genetic counseling and testing.  She will let me know if she would like a referral.  Follow up annually and prn.

## 2023-04-28 NOTE — Telephone Encounter (Signed)
Form completed and returned to [facility] 

## 2023-04-28 NOTE — Telephone Encounter (Signed)
Form faxed and placed in scan  

## 2023-05-04 DIAGNOSIS — F5081 Binge eating disorder: Secondary | ICD-10-CM | POA: Diagnosis not present

## 2023-05-11 ENCOUNTER — Encounter: Payer: Self-pay | Admitting: Obstetrics and Gynecology

## 2023-05-11 ENCOUNTER — Ambulatory Visit (INDEPENDENT_AMBULATORY_CARE_PROVIDER_SITE_OTHER): Payer: BC Managed Care – PPO | Admitting: Obstetrics and Gynecology

## 2023-05-11 ENCOUNTER — Other Ambulatory Visit (HOSPITAL_COMMUNITY)
Admission: RE | Admit: 2023-05-11 | Discharge: 2023-05-11 | Disposition: A | Discharge status: Home / Self Care | Payer: BC Managed Care – PPO | Source: Ambulatory Visit | Attending: Obstetrics and Gynecology | Admitting: Obstetrics and Gynecology

## 2023-05-11 VITALS — BP 124/80 | HR 61 | Ht 66.0 in | Wt 162.0 lb

## 2023-05-11 DIAGNOSIS — Z01419 Encounter for gynecological examination (general) (routine) without abnormal findings: Secondary | ICD-10-CM | POA: Diagnosis not present

## 2023-05-11 DIAGNOSIS — Z124 Encounter for screening for malignant neoplasm of cervix: Secondary | ICD-10-CM

## 2023-05-11 MED ORDER — VALACYCLOVIR HCL 500 MG PO TABS: 500.0000 mg | ORAL_TABLET | Freq: Two times a day (BID) | ORAL | 1 refills | Status: DC

## 2023-05-11 NOTE — Patient Instructions (Signed)

## 2023-05-13 ENCOUNTER — Telehealth: Payer: Self-pay | Admitting: Obstetrics and Gynecology

## 2023-05-13 NOTE — Telephone Encounter (Signed)
Please contact Dr. Earmon Phoenix office regarding recommend of patient use of intravaginal "estriol" for vaginal atrophy.   She found an over the counter product with estriol in it and is inquiring about its use for her.   I informed her that this is an estrogen, but that it is weaker than estradiol.  She has a history of DVT while on birth control several years ago.   She saw Dr. Pamelia Hoit for evaluation in 2018 and had a negative work up for underlying causes.  I would like his input regarding her potential use this product.

## 2023-05-13 NOTE — Telephone Encounter (Signed)
Staff message to Dr. Pamelia Hoit requesting recommendations.

## 2023-05-18 NOTE — Telephone Encounter (Signed)
Serena Croissant, MD  Leda Min, RN; Patton Salles, MD We are ok with small amounts of vaginal estrogens when taken twice a week Thanks for reaching out [facility]

## 2023-05-18 NOTE — Telephone Encounter (Signed)
Please reach out to patient and share this message from Dr. Pamelia Hoit that she may use the vaginal estrogen twice a week.   I would favor her using OTC estriol instead of prescription vaginal estradiol, as the estriol is a weaker form of estrogen.

## 2023-05-19 LAB — CYTOLOGY - PAP
Comment: NEGATIVE
Diagnosis: NEGATIVE
High risk HPV: NEGATIVE

## 2023-05-19 NOTE — Telephone Encounter (Signed)
Call placed to patient, no answer.  MyChart message sent with f/u.

## 2023-05-19 NOTE — Telephone Encounter (Signed)
Dr. Pamelia Hoit is recommending limiting the use of the vaginal estrogen to twice weekly.

## 2023-05-19 NOTE — Telephone Encounter (Signed)
Spoke with patient, advised per Dr. Pamelia Hoit and Dr. Edward Jolly. Patient has picked up OTC estriol, asking if she can use daily as instructed on the box or twice weekly?   Dr. Edward Jolly -please confirm.

## 2023-05-23 NOTE — Telephone Encounter (Signed)
Last read by Corene Cornea at  6:07 PM on 05/19/2023.    Encounter closed.

## 2023-06-16 ENCOUNTER — Other Ambulatory Visit (HOSPITAL_COMMUNITY): Payer: Self-pay

## 2023-06-16 ENCOUNTER — Telehealth: Payer: Self-pay

## 2023-06-16 NOTE — Telephone Encounter (Signed)
*  Primary  Pharmacy Patient Advocate Encounter   Received notification from CoverMyMeds that prior authorization for Zepbound 12.5MG /0.5ML pen-injectors  is required/requested.   Insurance verification completed.   The patient is insured through [facility] .   Per test claim: PA required; PA submitted to [employer] via CoverMyMeds Key/confirmation #/EOC [id number] Status is pending    *Per test claim anti-obesity drugs not covered

## 2023-06-16 NOTE — Telephone Encounter (Signed)
A user error has taken place: encounter opened in error, closed for administrative reasons.

## 2023-06-21 NOTE — Telephone Encounter (Signed)
Pharmacy Patient Advocate Encounter  Received notification from [facility] that Prior Authorization for ZEPBOUND has been DENIED.  Full denial letter will be uploaded to the media tab. See denial reason below.   PA #/Case ID/Reference #: [id number]   DENIAL REASON: Plan exclusion

## 2023-06-21 NOTE — Telephone Encounter (Signed)
Please advise 

## 2023-06-21 NOTE — Telephone Encounter (Signed)
At this time we don't have any other options as her insurance states they only cover surgery.  Pt will need to decide if she wants to pay out of pocket for medication

## 2023-06-22 NOTE — Telephone Encounter (Signed)
Called and notes patient has been paying out of pocket for this medication for 4 months

## 2023-07-07 DIAGNOSIS — G43719 Chronic migraine without aura, intractable, without status migrainosus: Secondary | ICD-10-CM | POA: Diagnosis not present

## 2023-07-12 DIAGNOSIS — G43719 Chronic migraine without aura, intractable, without status migrainosus: Secondary | ICD-10-CM | POA: Diagnosis not present

## 2023-07-24 DIAGNOSIS — H10023 Other mucopurulent conjunctivitis, bilateral: Secondary | ICD-10-CM | POA: Diagnosis not present

## 2023-07-26 ENCOUNTER — Encounter: Payer: Self-pay | Admitting: Family Medicine

## 2023-07-26 NOTE — Telephone Encounter (Signed)
Pt requesting you send something diff for eye itching saw teledoc Sat and they determined pink eye, would you like an appointment with patient?

## 2023-07-27 DIAGNOSIS — B309 Viral conjunctivitis, unspecified: Secondary | ICD-10-CM | POA: Diagnosis not present

## 2023-07-27 DIAGNOSIS — H04123 Dry eye syndrome of bilateral lacrimal glands: Secondary | ICD-10-CM | POA: Diagnosis not present

## 2023-08-30 ENCOUNTER — Other Ambulatory Visit: Payer: Self-pay | Admitting: Family Medicine

## 2023-08-30 DIAGNOSIS — E038 Other specified hypothyroidism: Secondary | ICD-10-CM

## 2023-10-10 DIAGNOSIS — G43719 Chronic migraine without aura, intractable, without status migrainosus: Secondary | ICD-10-CM | POA: Diagnosis not present

## 2023-10-14 DIAGNOSIS — G43719 Chronic migraine without aura, intractable, without status migrainosus: Secondary | ICD-10-CM | POA: Diagnosis not present

## 2023-10-18 ENCOUNTER — Other Ambulatory Visit: Payer: Self-pay

## 2023-10-18 DIAGNOSIS — F32A Depression, unspecified: Secondary | ICD-10-CM

## 2023-10-18 MED ORDER — BUPROPION HCL ER (XL) 300 MG PO TB24
300.0000 mg | ORAL_TABLET | Freq: Every day | ORAL | 1 refills | Status: DC
Start: 2023-10-18 — End: 2024-04-23

## 2023-10-20 DIAGNOSIS — I739 Peripheral vascular disease, unspecified: Secondary | ICD-10-CM | POA: Insufficient documentation

## 2023-10-20 DIAGNOSIS — R208 Other disturbances of skin sensation: Secondary | ICD-10-CM | POA: Diagnosis not present

## 2023-10-20 DIAGNOSIS — M180 Bilateral primary osteoarthritis of first carpometacarpal joints: Secondary | ICD-10-CM | POA: Diagnosis not present

## 2023-10-20 DIAGNOSIS — R209 Unspecified disturbances of skin sensation: Secondary | ICD-10-CM | POA: Insufficient documentation

## 2023-10-28 DIAGNOSIS — F50814 Binge eating disorder, in remission: Secondary | ICD-10-CM | POA: Diagnosis not present

## 2023-11-07 ENCOUNTER — Encounter: Payer: Self-pay | Admitting: Family Medicine

## 2023-11-08 MED ORDER — ZEPBOUND 12.5 MG/0.5ML ~~LOC~~ SOAJ: 12.5000 mg | SUBCUTANEOUS | 1 refills | Status: DC

## 2023-11-08 NOTE — Telephone Encounter (Signed)
 Requested Prescriptions   Pending Prescriptions Disp Refills   tirzepatide (ZEPBOUND) 12.5 MG/0.5ML Pen 6 mL 1    Sig: Inject 12.5 mg into the skin once a week.     Date of patient request: 11/08/2023 Last office visit: 02/22/2023 Upcoming visit: Visit date not found Date of last refill: 02/23/2023 Last refill amount: 6mL x1

## 2024-01-09 DIAGNOSIS — G43719 Chronic migraine without aura, intractable, without status migrainosus: Secondary | ICD-10-CM | POA: Diagnosis not present

## 2024-01-12 DIAGNOSIS — G43009 Migraine without aura, not intractable, without status migrainosus: Secondary | ICD-10-CM | POA: Diagnosis not present

## 2024-01-12 DIAGNOSIS — G43719 Chronic migraine without aura, intractable, without status migrainosus: Secondary | ICD-10-CM | POA: Diagnosis not present

## 2024-02-10 DIAGNOSIS — G43719 Chronic migraine without aura, intractable, without status migrainosus: Secondary | ICD-10-CM | POA: Diagnosis not present

## 2024-02-10 DIAGNOSIS — J45909 Unspecified asthma, uncomplicated: Secondary | ICD-10-CM | POA: Diagnosis not present

## 2024-02-20 ENCOUNTER — Encounter: Payer: Self-pay | Admitting: Family Medicine

## 2024-02-29 ENCOUNTER — Ambulatory Visit: Admitting: Family Medicine

## 2024-02-29 ENCOUNTER — Encounter: Payer: Self-pay | Admitting: Family Medicine

## 2024-02-29 VITALS — BP 108/64 | HR 85 | Temp 98.6°F | Ht 66.0 in | Wt 153.0 lb

## 2024-02-29 DIAGNOSIS — E663 Overweight: Secondary | ICD-10-CM | POA: Diagnosis not present

## 2024-02-29 MED ORDER — SCOPOLAMINE 1 MG/3DAYS TD PT72: 1.0000 | MEDICATED_PATCH | TRANSDERMAL | 0 refills | Status: DC

## 2024-02-29 MED ORDER — ZEPBOUND 15 MG/0.5ML ~~LOC~~ SOAJ: 15.0000 mg | SUBCUTANEOUS | 3 refills | Status: DC

## 2024-02-29 NOTE — Patient Instructions (Addendum)
 Schedule your complete physical in 6 months Increase the Zepbound  to 15mg  weekly Keep up the good work on healthy diet and regular exercise- you look great! Call with any questions or concerns Stay Safe!  Stay Healthy! Have a great summer!!!

## 2024-02-29 NOTE — Progress Notes (Signed)
   Subjective:    Patient ID: Ria Chad, female    DOB: Jan 14, 1966, 58 y.o.   MRN: [id number]  HPI Overweight- pt is down another 7 lbs.  BMI now 24.69 and in the normal range.  Currently on Zepbound  12.5mg  weekly.  She has been attempting to space out dosing to 10-14 days since she is paying out of pocket.  Pt reports feeling good.  Pt is interested in increasing to 15mg  weekly.  Denies CP, SOB, occasional abd pain (shot related), no N/V.     Review of Systems For ROS see HPI     Objective:   Physical Exam Vitals reviewed.  Constitutional:      General: She is not in acute distress.    Appearance: Normal appearance. She is well-developed. She is not ill-appearing.  HENT:     Head: Normocephalic and atraumatic.   Eyes:     Conjunctiva/sclera: Conjunctivae normal.     Pupils: Pupils are equal, round, and reactive to light.   Neck:     Thyroid : No thyromegaly.   Cardiovascular:     Rate and Rhythm: Normal rate and regular rhythm.     Pulses: Normal pulses.     Heart sounds: Normal heart sounds. No murmur heard. Pulmonary:     Effort: Pulmonary effort is normal. No respiratory distress.     Breath sounds: Normal breath sounds.  Abdominal:     General: There is no distension.     Palpations: Abdomen is soft.     Tenderness: There is no abdominal tenderness.   Musculoskeletal:     Cervical back: Normal range of motion and neck supple.     Right lower leg: No edema.     Left lower leg: No edema.  Lymphadenopathy:     Cervical: No cervical adenopathy.   Skin:    General: Skin is warm and dry.   Neurological:     General: No focal deficit present.     Mental Status: She is alert and oriented to person, place, and time.   Psychiatric:        Mood and Affect: Mood normal.        Behavior: Behavior normal.        Thought Content: Thought content normal.           Assessment & Plan:

## 2024-02-29 NOTE — Assessment & Plan Note (Signed)
 Pt is down another 7 lbs and BMI is now in normal range at 24.69.  She is interested in increasing dose of Zepbound  in hopes of spacing out her dose to once every 3 weeks to save money.  New prescription sent.

## 2024-03-01 ENCOUNTER — Other Ambulatory Visit: Payer: Self-pay | Admitting: Family Medicine

## 2024-03-01 DIAGNOSIS — E038 Other specified hypothyroidism: Secondary | ICD-10-CM

## 2024-03-02 ENCOUNTER — Telehealth: Payer: Self-pay

## 2024-03-02 NOTE — Telephone Encounter (Signed)
 Copied from CRM [id number]. Topic: Clinical - Medical Advice >> Mar 02, 2024  2:40 PM Allyne Areola wrote: Reason for CRM: [facility] pharmacy is calling to get an ICD 10 code for a prescription they received for Zepound. Best call back number is [phone].

## 2024-03-02 NOTE — Telephone Encounter (Signed)
 Returned call to pharmacy for ICD 10 code for Zepbound  due to the coupon patient was using needed this information. Looked back at recent visit and received code E66.3. Pharmacy verbalized understanding.

## 2024-03-05 DIAGNOSIS — D692 Other nonthrombocytopenic purpura: Secondary | ICD-10-CM | POA: Diagnosis not present

## 2024-03-05 DIAGNOSIS — L821 Other seborrheic keratosis: Secondary | ICD-10-CM | POA: Diagnosis not present

## 2024-03-05 DIAGNOSIS — D225 Melanocytic nevi of trunk: Secondary | ICD-10-CM | POA: Diagnosis not present

## 2024-03-05 DIAGNOSIS — D2271 Melanocytic nevi of right lower limb, including hip: Secondary | ICD-10-CM | POA: Diagnosis not present

## 2024-03-05 DIAGNOSIS — L570 Actinic keratosis: Secondary | ICD-10-CM | POA: Diagnosis not present

## 2024-04-20 ENCOUNTER — Other Ambulatory Visit: Payer: Self-pay | Admitting: Family Medicine

## 2024-04-20 DIAGNOSIS — F32A Depression, unspecified: Secondary | ICD-10-CM

## 2024-04-23 DIAGNOSIS — F50814 Binge eating disorder, in remission: Secondary | ICD-10-CM | POA: Diagnosis not present

## 2024-05-02 DIAGNOSIS — G43719 Chronic migraine without aura, intractable, without status migrainosus: Secondary | ICD-10-CM | POA: Diagnosis not present

## 2024-05-08 DIAGNOSIS — G43719 Chronic migraine without aura, intractable, without status migrainosus: Secondary | ICD-10-CM | POA: Diagnosis not present

## 2024-05-20 ENCOUNTER — Encounter: Payer: Self-pay | Admitting: Family Medicine

## 2024-05-28 ENCOUNTER — Other Ambulatory Visit: Payer: Self-pay | Admitting: Family Medicine

## 2024-05-28 DIAGNOSIS — Z1231 Encounter for screening mammogram for malignant neoplasm of breast: Secondary | ICD-10-CM

## 2024-06-04 ENCOUNTER — Ambulatory Visit (INDEPENDENT_AMBULATORY_CARE_PROVIDER_SITE_OTHER): Admitting: Family Medicine

## 2024-06-04 ENCOUNTER — Encounter: Payer: Self-pay | Admitting: Family Medicine

## 2024-06-04 VITALS — BP 122/88 | HR 88 | Temp 97.9°F | Resp 17 | Ht 66.0 in | Wt 156.4 lb

## 2024-06-04 DIAGNOSIS — R0789 Other chest pain: Secondary | ICD-10-CM

## 2024-06-04 DIAGNOSIS — Z0000 Encounter for general adult medical examination without abnormal findings: Secondary | ICD-10-CM | POA: Diagnosis not present

## 2024-06-04 DIAGNOSIS — E785 Hyperlipidemia, unspecified: Secondary | ICD-10-CM | POA: Diagnosis not present

## 2024-06-04 LAB — LIPID PANEL
Cholesterol: 216 mg/dL — ABNORMAL HIGH (ref 0–200)
HDL: 59.8 mg/dL (ref >39.00)
LDL Cholesterol: 136 mg/dL — ABNORMAL HIGH (ref 0–99)
NonHDL: 156.63
Total CHOL/HDL Ratio: 4
Triglycerides: 101 mg/dL (ref 0.0–149.0)
VLDL: 20.2 mg/dL (ref 0.0–40.0)

## 2024-06-04 LAB — CBC WITH DIFFERENTIAL/PLATELET
Basophils Absolute: 0 K/uL (ref 0.0–0.1)
Basophils Relative: 0.7 % (ref 0.0–3.0)
Eosinophils Absolute: 0.1 K/uL (ref 0.0–0.7)
Eosinophils Relative: 1.6 % (ref 0.0–5.0)
HCT: 42.3 % (ref 36.0–46.0)
Hemoglobin: 14.1 g/dL (ref 12.0–15.0)
Lymphocytes Relative: 23.2 % (ref 12.0–46.0)
Lymphs Abs: 1.6 K/uL (ref 0.7–4.0)
MCHC: 33.5 g/dL (ref 30.0–36.0)
MCV: 88.6 fl (ref 78.0–100.0)
Monocytes Absolute: 0.4 K/uL (ref 0.1–1.0)
Monocytes Relative: 5.6 % (ref 3.0–12.0)
Neutro Abs: 4.8 K/uL (ref 1.4–7.7)
Neutrophils Relative %: 68.9 % (ref 43.0–77.0)
Platelets: 344 K/uL (ref 150.0–400.0)
RBC: 4.77 Mil/uL (ref 3.87–5.11)
RDW: 13.1 % (ref 11.5–15.5)
WBC: 6.9 K/uL (ref 4.0–10.5)

## 2024-06-04 LAB — HEPATIC FUNCTION PANEL
ALT: 12 U/L (ref 0–35)
AST: 19 U/L (ref 0–37)
Albumin: 4.2 g/dL (ref 3.5–5.2)
Alkaline Phosphatase: 58 U/L (ref 39–117)
Bilirubin, Direct: 0.1 mg/dL (ref 0.0–0.3)
Total Bilirubin: 0.6 mg/dL (ref 0.2–1.2)
Total Protein: 6.7 g/dL (ref 6.0–8.3)

## 2024-06-04 LAB — BASIC METABOLIC PANEL WITH GFR
BUN: 13 mg/dL (ref 6–23)
CO2: 30 meq/L (ref 19–32)
Calcium: 9.7 mg/dL (ref 8.4–10.5)
Chloride: 105 meq/L (ref 96–112)
Creatinine, Ser: 0.92 mg/dL (ref 0.40–1.20)
GFR: 68.44 mL/min (ref >60.00)
Glucose, Bld: 70 mg/dL (ref 70–99)
Potassium: 4.6 meq/L (ref 3.5–5.1)
Sodium: 140 meq/L (ref 135–145)

## 2024-06-04 LAB — TSH: TSH: 0.81 u[IU]/mL (ref 0.35–5.50)

## 2024-06-04 MED ORDER — PANTOPRAZOLE SODIUM 20 MG PO TBEC: 20.0000 mg | DELAYED_RELEASE_TABLET | Freq: Every day | ORAL | 3 refills | Status: AC

## 2024-06-04 NOTE — Assessment & Plan Note (Signed)
 Pt's PE WNL.  UTD on pap, cologuard, Tdap.  Mammo scheduled.  Declines other vaccines.  Check labs.  Anticipatory guidance provided.

## 2024-06-04 NOTE — Progress Notes (Signed)
 Subjective:    Patient ID: Gwendolyn Shields, female    DOB: [birth year], 58 y.o.   MRN: [id number]  HPI CPE- mammo is scheduled.  Declines flu, PNA, shingles, Hep B.  UTD on cologuard and pap.  Patient Care Team    Relationship Specialty Notifications Start End  Mahlon Comer BRAVO, MD PCP - General Family Medicine  07/30/13   Vincente Grip, MD Consulting Physician Psychiatry  10/20/18   Ob/Gyn, Landy Stains    02/29/24     Health Maintenance  Topic Date Due   Mammogram  04/04/2024   Zoster Vaccines- Shingrix (1 of 2) 09/03/2024 (Originally 07/07/2015)   Influenza Vaccine  12/11/2024 (Originally 04/13/2024)   Pneumococcal Vaccine: 50+ Years (1 of 2 - PCV) 06/04/2025 (Originally 07/06/1984)   Hepatitis B Vaccines 19-59 Average Risk (3 of 3 - 19+ 3-dose series) 06/04/2025 (Originally 08/17/2022)   Fecal DNA (Cologuard)  11/27/2024   Cervical Cancer Screening (HPV/Pap Cotest)  05/10/2028   DTaP/Tdap/Td (2 - Td or Tdap) 02/16/2032   Hepatitis C Screening  Completed   HPV VACCINES  Aged Out   Meningococcal B Vaccine  Aged Out   COVID-19 Vaccine  Discontinued   HIV Screening  Discontinued      Review of Systems Patient reports no vision/ hearing changes, adenopathy,fever, weight change,  persistant/recurrent hoarseness , swallowing issues, palpitations, edema, persistant/recurrent cough, hemoptysis, dyspnea (rest/exertional/paroxysmal nocturnal), gastrointestinal bleeding (melena, rectal bleeding), abdominal pain, significant heartburn, bowel changes, GU symptoms (dysuria, hematuria, incontinence), Gyn symptoms (abnormal  bleeding, pain),  syncope, focal weakness, memory loss, numbness & tingling, skin/nail changes, abnormal bruising or bleeding, anxiety, or depression.   + chest pressure- pt has hx of similar that responded to antacids in the past.  Occurred before bed x1 and resolved spontaneously.  Occurred after [place] food + hair loss    Objective:   Physical Exam General  Appearance:    Alert, cooperative, no distress, appears stated age  Head:    Normocephalic, without obvious abnormality, atraumatic  Eyes:    PERRL, conjunctiva/corneas clear, EOM's intact both eyes  Ears:    Normal TM's and external ear canals, both ears  Nose:   Nares normal, septum midline, mucosa normal, no drainage    or sinus tenderness  Throat:   Lips, mucosa, and tongue normal; teeth and gums normal  Neck:   Supple, symmetrical, trachea midline, no adenopathy;    Thyroid : no enlargement/tenderness/nodules  Back:     Symmetric, no curvature, ROM normal, no CVA tenderness  Lungs:     Clear to auscultation bilaterally, respirations unlabored  Chest Wall:    No tenderness or deformity   Heart:    Regular rate and rhythm, S1 and S2 normal, no murmur, rub   or gallop  Breast Exam:    Deferred to GYN  Abdomen:     Soft, non-tender, bowel sounds active all four quadrants,    no masses, no organomegaly  Genitalia:    Deferred to GYN  Rectal:    Extremities:   Extremities normal, atraumatic, no cyanosis or edema  Pulses:   2+ and symmetric all extremities  Skin:   Skin color, texture, turgor normal, no rashes or lesions  Lymph nodes:   Cervical, supraclavicular, and axillary nodes normal  Neurologic:   CNII-XII intact, normal strength, sensation and reflexes    throughout          Assessment & Plan:  Chest pressure- new.  Pt reports hx of similar that resolved w/ antacid  use.  Has not had an episode in years but did have one the other night after eating [place] food.  Sxs spontaneously resolved and have not returned.  Offered stress test to risk stratify- pt did not feel this was necessary.  Discussed EKG- pt did not want at this time.  Will start low dose PPI and pt is to monitor sxs closely.  Pt expressed understanding and is in agreement w/ plan.

## 2024-06-04 NOTE — Patient Instructions (Signed)
 Follow up in 6 months to recheck thyroid  and GLP 1 use We'll notify you of your lab results and make any changes if needed Keep up the good work!  You look great! Try and add regular exercise to improve stamina IF you have additional episodes of chest pressure- let me know ASAP or go to ER if needed START the Pantoprazole  daily to decrease acid Call with any questions or concerns Stay Safe!  Stay Healthy! Happy Fall!!

## 2024-06-05 ENCOUNTER — Ambulatory Visit: Payer: Self-pay | Admitting: Family Medicine

## 2024-06-05 NOTE — Progress Notes (Signed)
 Patient read message from Dr. Mahlon about labs and reviewed results.

## 2024-06-15 ENCOUNTER — Ambulatory Visit
Admission: RE | Admit: 2024-06-15 | Discharge: 2024-06-15 | Disposition: A | Discharge status: Home / Self Care | Source: Ambulatory Visit | Attending: Family Medicine | Admitting: Family Medicine

## 2024-06-15 DIAGNOSIS — Z1231 Encounter for screening mammogram for malignant neoplasm of breast: Secondary | ICD-10-CM | POA: Diagnosis not present

## 2024-06-20 ENCOUNTER — Ambulatory Visit: Payer: Self-pay | Admitting: Obstetrics and Gynecology

## 2024-07-17 ENCOUNTER — Encounter: Payer: Self-pay | Admitting: Obstetrics and Gynecology

## 2024-07-17 ENCOUNTER — Ambulatory Visit (INDEPENDENT_AMBULATORY_CARE_PROVIDER_SITE_OTHER): Admitting: Obstetrics and Gynecology

## 2024-07-17 VITALS — BP 122/80 | HR 105 | Ht 67.5 in | Wt 158.0 lb

## 2024-07-17 DIAGNOSIS — Z01419 Encounter for gynecological examination (general) (routine) without abnormal findings: Secondary | ICD-10-CM

## 2024-07-17 DIAGNOSIS — Z1331 Encounter for screening for depression: Secondary | ICD-10-CM

## 2024-07-17 MED ORDER — VALACYCLOVIR HCL 500 MG PO TABS: 500.0000 mg | ORAL_TABLET | Freq: Two times a day (BID) | ORAL | 1 refills | Status: AC

## 2024-07-17 NOTE — Patient Instructions (Signed)

## 2024-07-17 NOTE — Progress Notes (Signed)
 58 y.o. [id number] Married Caucasian female here for annual exam.    No GYN concerns.    Not using vaginal estriol cream, Ok'd by hematology.   Going to [place]  for Thanksgiving.  Father in law passed away this year.   PCP: Mahlon Comer BRAVO, MD   Patient's last menstrual period was 02/27/2016.           Sexually active: Yes.    The current method of family planning is post menopausal status.    Menopausal hormone therapy:  n/a Exercising: No.   Smoker:  no  OB History  Gravida Para Term Preterm AB Living  4 2 2  2 2   SAB IAB Ectopic Multiple Live Births  2    2    # Outcome Date GA Lbr Len/2nd Weight Sex Type Anes PTL Lv  4 Term 2002 [redacted]w[redacted]d  7 lb 9 oz (3.43 kg) F Vag-Spont   LIV  3 SAB 2001          2 Term 2000 [redacted]w[redacted]d  7 lb 11 oz (3.487 kg) F Vag-Spont   LIV  1 SAB 1999             HEALTH MAINTENANCE: Last 2 paps:  05/11/23 neg HR HPV neg, 05/19/18 neg HPV neg History of abnormal Pap or positive HPV:  hx LEEP years ago.  Mammogram:   06/15/24 Breast Density Cat B, BIRADS Cat 1 neg  Colonoscopy:  cologuard 11/27/21 neg  Bone Density:  n/a  Result  n/a   Immunization History  Administered Date(s) Administered   Hep A / Hep B 02/15/2022, 03/30/2022   Influenza,inj,Quad PF,6+ Mos 07/10/2014, 06/14/2015, 05/10/2016, 06/29/2017, 05/29/2018, 05/25/2019, 07/25/2020, 08/12/2021   PFIZER(Purple Top)SARS-COV-2 Vaccination 12/06/2019, 12/31/2019   Tdap 02/15/2022      reports that she has never smoked. She has never used smokeless tobacco. She reports current alcohol use. She reports that she does not use drugs.  Past Medical History:  Diagnosis Date   Clotting disorder    dvt in rt.leg 2016   Depression    DVT (deep venous thrombosis) (HCC)    Dysplasia of cervix    Family history of breast cancer    Hyperlipidemia 08/2014   borderline   Migraine    STD (sexually transmitted disease)    HSV 2   Thyroid  disease    Urinary incontinence     Past Surgical History:   Procedure Laterality Date   BREAST BIOPSY Left 2001   benign   CERVICAL BIOPSY  W/ LOOP ELECTRODE EXCISION     9yrs ago   COSMETIC SURGERY  09/13/2021   abdominoplasty   DILATION AND CURETTAGE OF UTERUS     age 84   INTRAUTERINE DEVICE (IUD) INSERTION  07/19/2014   Mirena   IUD REMOVAL      Current Outpatient Medications  Medication Sig Dispense Refill   albuterol  (VENTOLIN  HFA) 108 (90 Base) MCG/ACT inhaler Inhale 2 puffs into the lungs every 6 (six) hours as needed for wheezing or shortness of breath. 18 g 1   Atogepant (QULIPTA) 30 MG TABS Take by mouth.     buPROPion  (WELLBUTRIN  XL) 300 MG 24 hr tablet TAKE 1 TABLET(300 MG) BY MOUTH DAILY 90 tablet 1   eletriptan  (RELPAX ) 40 MG tablet TAKE 1 TABLET BY MOUTH AS NEEDED FOR HEADACHE/ MIGRAINES(MAY REPEAT IN 2 HOURS IF HEADACHE PERSISTS OR RECURS) 10 tablet 0   levothyroxine  (SYNTHROID ) 112 MCG tablet TAKE 1 TABLET(112 MCG) BY MOUTH DAILY 90  tablet 1   lisdexamfetamine (VYVANSE) 40 MG capsule Take 40 mg by mouth every morning.     Multiple Vitamin (MULTIVITAMIN) tablet Take 1 tablet by mouth daily.     ondansetron  (ZOFRAN -ODT) 8 MG disintegrating tablet ondansetron  8 mg disintegrating tablet     pantoprazole  (PROTONIX ) 20 MG tablet Take 1 tablet (20 mg total) by mouth daily. 30 tablet 3   Riboflavin 400 MG TABS Take 1 tablet by mouth daily.     tirzepatide  (ZEPBOUND ) 15 MG/0.5ML Pen Inject 15 mg into the skin once a week. 2 mL 3   valACYclovir  (VALTREX ) 500 MG tablet Take 1 tablet (500 mg total) by mouth 2 (two) times daily. Take for 3 days as needed for an outbreak. 30 tablet 1   fluticasone  furoate-vilanterol (BREO ELLIPTA ) 100-25 MCG/ACT AEPB Inhale 1 puff into the lungs daily. (Patient not taking: Reported on 07/17/2024) 2 each 0   No current facility-administered medications for this visit.    ALLERGIES: Other, Wound dressing adhesive, Lipitor [atorvastatin ], Adhesive [tape], and Latex  Family History  Problem Relation Age  of Onset   Breast cancer Mother 25       recurence of breast cancer early 16's then developed bone cancer ate 19 /then developed bone cancer from recurence age 77   Heart failure Father 43   Hypertension Father    Stroke Maternal Grandmother    Diabetes Maternal Grandfather    Diabetes Paternal Grandmother    Cervical cancer Sister 3   Cancer Sister 44       Stage IV lung ca and brain tumor   Colon cancer Neg Hx    Colon polyps Neg Hx    Esophageal cancer Neg Hx    Rectal cancer Neg Hx    Stomach cancer Neg Hx     Review of Systems  All other systems reviewed and are negative.   PHYSICAL EXAM:  BP 122/80 (BP Location: Left Arm, Patient Position: Sitting)   Pulse (!) 105   Ht 5' 7.5 (1.715 m)   Wt 158 lb (71.7 kg)   LMP 02/27/2016   SpO2 100%   BMI 24.38 kg/m     General appearance: alert, cooperative and appears stated age Head: normocephalic, without obvious abnormality, atraumatic Neck: no adenopathy, supple, symmetrical, trachea midline and thyroid  normal to inspection and palpation Lungs: clear to auscultation bilaterally Breasts: normal appearance, no masses or tenderness, No nipple retraction or dimpling, No nipple discharge or bleeding, No axillary adenopathy Heart: regular rate and rhythm Abdomen: soft, non-tender; no masses, no organomegaly Extremities: extremities normal, atraumatic, no cyanosis or edema Skin: skin color, texture, turgor normal. No rashes or lesions Lymph nodes: cervical, supraclavicular, and axillary nodes normal. Neurologic: grossly normal  Pelvic: External genitalia:  no lesions              No abnormal inguinal nodes palpated.              Urethra:  normal appearing urethra with no masses, tenderness or lesions              Bartholins and Skenes: normal                 Vagina: normal appearing vagina with normal color and discharge, no lesions              Cervix: no lesions              Pap taken: no Bimanual Exam:  Uterus:  normal  size, contour,  position, consistency, mobility, non-tender              Adnexa: no mass, fullness, tenderness              Rectal exam: yes.  Confirms.              Anus:  normal sphincter tone, hemorrhoid noted.  Chaperone was present for exam:  Kari HERO, CMA  ASSESSMENT: Well woman visit with gynecologic exam. LEEP about 30 years ago.  FH of breast and possible uterine cancer.   Hx DVT.  Hx HSV 2. Hx hyperhidrosis.  PHQ-2-9: 0  PLAN: Mammogram screening discussed. Self breast awareness reviewed. Pap and HRV collected:  no.  Due in 2029. Guidelines for Calcium , Vitamin D , regular exercise program including cardiovascular and weight bearing exercise. We reviewed vaginal vit E and cooking oils for tx of vaginal atrophy.  Medication refills:  Valtrex  500 mg po bid x 3 days prn.  Labs with PCP.  Colonoscopy next year.  Follow up:  yearly and prn.

## 2024-08-01 DIAGNOSIS — G43719 Chronic migraine without aura, intractable, without status migrainosus: Secondary | ICD-10-CM | POA: Diagnosis not present

## 2024-08-14 DIAGNOSIS — G43719 Chronic migraine without aura, intractable, without status migrainosus: Secondary | ICD-10-CM | POA: Diagnosis not present

## 2024-08-23 ENCOUNTER — Other Ambulatory Visit: Payer: Self-pay | Admitting: Family Medicine

## 2024-08-23 DIAGNOSIS — E038 Other specified hypothyroidism: Secondary | ICD-10-CM

## 2024-08-29 ENCOUNTER — Encounter: Admitting: Family Medicine

## 2024-09-19 ENCOUNTER — Encounter: Payer: Self-pay | Admitting: Family Medicine

## 2024-09-19 NOTE — Telephone Encounter (Signed)
 Patient is requesting the [facility] tablet form for her maintenance dosing, her last visit was 06/04/2024 for routine maintenance last e-prescription for Zepbound  15 mL was 02/29/2024, pt has been doing out of pocket   Please advise if patient is a candidate for tablet form

## 2024-10-02 NOTE — Telephone Encounter (Signed)
Patient responded.

## 2024-10-03 MED ORDER — WEGOVY 25 MG PO TABS: 25.0000 mg | ORAL_TABLET | Freq: Every day | ORAL | 3 refills | Status: DC

## 2024-10-08 ENCOUNTER — Telehealth: Payer: Self-pay

## 2024-10-08 ENCOUNTER — Other Ambulatory Visit (HOSPITAL_COMMUNITY): Payer: Self-pay

## 2024-10-08 NOTE — Telephone Encounter (Signed)
 Pharmacy Patient Advocate Encounter   Received notification from [facility] KEY that prior authorization for Wegovy  25MG  tablets is required/requested.   Insurance verification completed.   The patient is insured through [facility].   Per test claim: PA required; PA submitted to above mentioned insurance via Latent Key/confirmation #/EOC [id number] Status is pending

## 2024-10-09 MED ORDER — WEGOVY 25 MG PO TABS: 25.0000 mg | ORAL_TABLET | Freq: Every day | ORAL | 3 refills | Status: AC

## 2024-10-09 NOTE — Telephone Encounter (Signed)
 Pharmacy Patient Advocate Encounter  Received notification from [facility] that Prior Authorization for Wegovy  25mg  tabs has been DENIED.  Full denial letter will be uploaded to the media tab. See denial reason below.   PA #/Case ID/Reference #: [id number]

## 2024-10-09 NOTE — Telephone Encounter (Signed)
 Patient would like rx sent to [facility]. Okay to change?

## 2024-10-09 NOTE — Addendum Note (Signed)
 Addended by: Tymarion Everard E on: 10/09/2024 01:26 PM   Modules accepted: Orders

## 2024-10-10 NOTE — Telephone Encounter (Signed)
 MyChart message sent letting patient know of denial.

## 2024-12-03 ENCOUNTER — Ambulatory Visit: Admitting: Family Medicine

## 2025-07-29 ENCOUNTER — Ambulatory Visit: Admitting: Obstetrics and Gynecology
